# Patient Record
Sex: Female | Born: 1960
Health system: Southern US, Community
[De-identification: ages and names within clinical notes are randomized; demographics above are authoritative.]

## PROBLEM LIST (undated history)

## (undated) DIAGNOSIS — I700 Atherosclerosis of aorta: Secondary | ICD-10-CM

## (undated) DIAGNOSIS — K219 Gastro-esophageal reflux disease without esophagitis: Secondary | ICD-10-CM

## (undated) DIAGNOSIS — J449 Chronic obstructive pulmonary disease, unspecified: Secondary | ICD-10-CM

## (undated) DIAGNOSIS — M810 Age-related osteoporosis without current pathological fracture: Secondary | ICD-10-CM

## (undated) DIAGNOSIS — E559 Vitamin D deficiency, unspecified: Secondary | ICD-10-CM

## (undated) DIAGNOSIS — M549 Dorsalgia, unspecified: Secondary | ICD-10-CM

## (undated) DIAGNOSIS — J45909 Unspecified asthma, uncomplicated: Secondary | ICD-10-CM

## (undated) DIAGNOSIS — R0602 Shortness of breath: Secondary | ICD-10-CM

## (undated) HISTORY — DX: Atherosclerosis of aorta: I70.0

## (undated) HISTORY — DX: Vitamin D deficiency, unspecified: E55.9

## (undated) HISTORY — PX: BREAST EXCISIONAL BIOPSY: SUR124

## (undated) HISTORY — DX: Dorsalgia, unspecified: M54.9

## (undated) HISTORY — PX: BREAST BIOPSY: SHX20

## (undated) HISTORY — DX: Unspecified asthma, uncomplicated: J45.909

## (undated) HISTORY — DX: Age-related osteoporosis without current pathological fracture: M81.0

---

## 1983-09-17 HISTORY — PX: TUBAL LIGATION: SHX77

## 1994-09-16 HISTORY — PX: BREAST ENHANCEMENT SURGERY: SHX7

## 1999-11-14 ENCOUNTER — Other Ambulatory Visit: Admission: RE | Admit: 1999-11-14 | Discharge: 1999-11-14 | Payer: Self-pay | Admitting: Plastic Surgery

## 2006-02-02 ENCOUNTER — Emergency Department (HOSPITAL_COMMUNITY): Admission: EM | Admit: 2006-02-02 | Discharge: 2006-02-02 | Payer: Self-pay | Admitting: Family Medicine

## 2011-07-30 ENCOUNTER — Other Ambulatory Visit: Payer: Self-pay | Admitting: Family Medicine

## 2011-07-30 DIAGNOSIS — R921 Mammographic calcification found on diagnostic imaging of breast: Secondary | ICD-10-CM

## 2011-08-13 ENCOUNTER — Ambulatory Visit
Admission: RE | Admit: 2011-08-13 | Discharge: 2011-08-13 | Disposition: A | Discharge status: Home / Self Care | Payer: No Typology Code available for payment source | Source: Ambulatory Visit | Attending: Family Medicine | Admitting: Family Medicine

## 2011-08-13 DIAGNOSIS — R921 Mammographic calcification found on diagnostic imaging of breast: Secondary | ICD-10-CM

## 2011-08-23 ENCOUNTER — Encounter (HOSPITAL_COMMUNITY): Payer: Self-pay | Admitting: Pharmacy Technician

## 2011-08-26 ENCOUNTER — Encounter (HOSPITAL_COMMUNITY): Payer: Self-pay

## 2011-08-26 ENCOUNTER — Encounter (HOSPITAL_COMMUNITY)
Admission: RE | Admit: 2011-08-26 | Discharge: 2011-08-26 | Disposition: A | Discharge status: Home / Self Care | Payer: PRIVATE HEALTH INSURANCE | Source: Ambulatory Visit | Attending: General Surgery | Admitting: General Surgery

## 2011-08-26 HISTORY — DX: Shortness of breath: R06.02

## 2011-08-26 HISTORY — DX: Gastro-esophageal reflux disease without esophagitis: K21.9

## 2011-08-26 HISTORY — DX: Chronic obstructive pulmonary disease, unspecified: J44.9

## 2011-08-26 LAB — BASIC METABOLIC PANEL
BUN: 15 mg/dL (ref 6–23)
CO2: 28 mEq/L (ref 19–32)
Calcium: 10.6 mg/dL — ABNORMAL HIGH (ref 8.4–10.5)
Chloride: 100 mEq/L (ref 96–112)
Creatinine, Ser: 0.74 mg/dL (ref 0.50–1.10)
GFR calc Af Amer: >90 mL/min (ref >90)
GFR calc non Af Amer: >90 mL/min (ref >90)
Glucose, Bld: 97 mg/dL (ref 70–99)
Potassium: 4.2 mEq/L (ref 3.5–5.1)
Sodium: 138 mEq/L (ref 135–145)

## 2011-08-26 LAB — CBC
HCT: 40.5 % (ref 36.0–46.0)
Hemoglobin: 13.6 g/dL (ref 12.0–15.0)
MCH: 29.8 pg (ref 26.0–34.0)
MCHC: 33.6 g/dL (ref 30.0–36.0)
MCV: 88.6 fL (ref 78.0–100.0)
Platelets: 263 10*3/uL (ref 150–400)
RBC: 4.57 MIL/uL (ref 3.87–5.11)
RDW: 12.9 % (ref 11.5–15.5)
WBC: 6.8 10*3/uL (ref 4.0–10.5)

## 2011-08-26 LAB — DIFFERENTIAL
Basophils Absolute: 0 10*3/uL (ref 0.0–0.1)
Basophils Relative: 0 % (ref 0–1)
Eosinophils Absolute: 0.2 10*3/uL (ref 0.0–0.7)
Eosinophils Relative: 2 % (ref 0–5)
Lymphocytes Relative: 32 % (ref 12–46)
Lymphs Abs: 2.2 10*3/uL (ref 0.7–4.0)
Monocytes Absolute: 0.4 10*3/uL (ref 0.1–1.0)
Monocytes Relative: 6 % (ref 3–12)
Neutro Abs: 4 10*3/uL (ref 1.7–7.7)
Neutrophils Relative %: 59 % (ref 43–77)

## 2011-08-26 LAB — SURGICAL PCR SCREEN
MRSA, PCR: NEGATIVE
Staphylococcus aureus: POSITIVE — AB

## 2011-08-26 NOTE — Patient Instructions (Addendum)
Abigail Gill  08/26/2011   Your procedure is scheduled on:  08/30/2011  Report to [name] at 07:15 AM.  Call this number if you have problems the morning of surgery: [phone]   Remember:   Do not eat food:After Midnight.  May have clear liquids:until Midnight .  Clear liquids include soda, tea, black coffee, apple or grape juice, broth.  Take these medicines the morning of surgery with A SIP OF WATER: Zyrtec, Nexium, Singular. Also, take your inhalers, Albuterol and Advair.   Do not wear jewelry, make-up or nail polish.  Do not wear lotions, powders, or perfumes. You may wear deodorant.  Do not shave 48 hours prior to surgery.  Do not bring valuables to the hospital.  Contacts, dentures or bridgework may not be worn into surgery.  Leave suitcase in the car. After surgery it may be brought to your room.  For patients admitted to the hospital, checkout time is 11:00 AM the day of discharge.   Patients discharged the day of surgery will not be allowed to drive home.  Name and phone number of your driver:   Special Instructions: CHG Shower Use Special Wash: 1/2 bottle night before surgery and 1/2 bottle morning of surgery.   Please read over the following fact sheets that you were given: Pain Booklet, MRSA Information, Surgical Site Infection Prevention, Anesthesia Post-op Instructions and Care and Recovery After Surgery    Lumpectomy, Breast Conserving Surgery A lumpectomy is breast surgery that removes only part of the breast. Another name used may be partial mastectomy. The amount removed varies. Make sure you understand how much of your breast will be removed. Reasons for a lumpectomy:  Any solid breast mass.   Grouped significant nodularity that may be confused with a solitary breast mass.  Lumpectomy is the most common form of breast cancer surgery today. The surgeon removes the portion of your breast which contains the tumor (cancer). This is the lump. Some normal tissue  around the lump is also removed to be sure that all the tumor has been removed.  If cancer cells are found in the margins where the breast tissue was removed, your surgeon will do more surgery to remove the remaining cancer tissue. This is called re-excision surgery. Radiation and/or chemotherapy treatments are often given following a lumpectomy to kill any cancer cells that could possibly remain.  REASONS YOU MAY NOT BE ABLE TO HAVE BREAST CONSERVING SURGERY:  The tumor is located in more than one place.   Your breast is small and the tumor is large so the breast would be disfigured.   The entire tumor removal is not successful with a lumpectomy.   You cannot commit to a full course of chemotherapy, radiation therapy or are pregnant and cannot have radiation.   You have previously had radiation to the breast to treat cancer.  HOW A LUMPECTOMY IS PERFORMED If overnight nursing is not required following a biopsy, a lumpectomy can be performed as a same-day surgery. This can be done in a hospital, clinic, or surgical center. The anesthesia used will depend on your surgeon. They will discuss this with you. A general anesthetic keeps you sleeping through the procedure. LET YOUR CAREGIVERS KNOW ABOUT THE FOLLOWING:  Allergies   Medications taken including herbs, eye drops, over the counter medications, and creams.   Use of steroids (by mouth or creams)   Previous problems with anesthetics or Novocaine.   Possibility of pregnancy, if this applies   History of  blood clots (thrombophlebitis)   History of bleeding or blood problems.   Previous surgery   Other health problems  BEFORE THE PROCEDURE You should be present one hour prior to your procedure unless directed otherwise.  AFTER THE PROCEDURE  After surgery, you will be taken to the recovery area where a nurse will watch and check your progress. Once you're awake, stable, and taking fluids well, barring other problems you will be  allowed to go home.   Ice packs applied to your operative site may help with discomfort and keep the swelling down.   A small rubber drain may be placed in the breast for a couple of days to prevent a hematoma from developing in the breast.   A pressure dressing may be applied for 24 to 48 hours to prevent bleeding.   Keep the wound dry.   You may resume a normal diet and activities as directed. Avoid strenuous activities affecting the arm on the side of the biopsy site such as tennis, swimming, heavy lifting (more than 10 pounds) or pulling.   Bruising in the breast is normal following this procedure.   Wearing a bra - even to bed - may be more comfortable and also help keep the dressing on.   Change dressings as directed.   Only take over-the-counter or prescription medicines for pain, discomfort, or fever as directed by your caregiver.  Call for your results as instructed by your surgeon. Remember it is your responsibility to get the results of your lumpectomy if your surgeon asked you to follow-up. Do not assume everything is fine if you have not heard from your caregiver. SEEK MEDICAL CARE IF:   There is increased bleeding (more than a small spot) from the wound.   You notice redness, swelling, or increasing pain in the wound.   Pus is coming from wound.   An unexplained oral temperature above 102 F (38.9 C) develops.   You notice a foul smell coming from the wound or dressing.  SEEK IMMEDIATE MEDICAL CARE IF:   You develop a rash.   You have difficulty breathing.   You have any allergic problems.  Document Released: 10/14/2006 Document Revised: 05/15/2011 Document Reviewed: 01/15/2007 [facility] Patient Information 2012 [place], [place].   PATIENT INSTRUCTIONS POST-ANESTHESIA  IMMEDIATELY FOLLOWING SURGERY:  Do not drive or operate machinery for the first twenty four hours after surgery.  Do not make any important decisions for twenty four hours after surgery or  while taking narcotic pain medications or sedatives.  If you develop intractable nausea and vomiting or a severe headache please notify your doctor immediately.  FOLLOW-UP:  Please make an appointment with your surgeon as instructed. You do not need to follow up with anesthesia unless specifically instructed to do so.  WOUND CARE INSTRUCTIONS (if applicable):  Keep a dry clean dressing on the anesthesia/puncture wound site if there is drainage.  Once the wound has quit draining you may leave it open to air.  Generally you should leave the bandage intact for twenty four hours unless there is drainage.  If the epidural site drains for more than 36-48 hours please call the anesthesia department.  QUESTIONS?:  Please feel free to call your physician or the hospital operator if you have any questions, and they will be happy to assist you.     [facility] Anesthesia Department [address] [place] [phone]

## 2011-08-29 NOTE — H&P (Signed)
  NTS SOAP Note  Vital Signs:  Vitals as of: 08/22/2011: Systolic 151: Diastolic 89: Heart Rate 90: Temp 96.29F: Height 89ft 2in: Weight 158Lbs 0 Ounces: BMI 29  BMI : 28.9 kg/m2  Subjective: This 69 Years 78 Months old Female presents for of breast changes.  Patient had screening mammography which demonstrated microcalcifications.  Stereotactic bx performed and complex sclerosis lesion with atypia was seen.  Patient has not had any palpaple lesions.  No nipple changes although L nipple became retracted in the 80's.  No nipple discharge.  No fever or chills.  No wt changes.  No family history of breast, ovarian, or uterine CA.   Review of Symptoms:  Constitutional:unremarkable Head:unremarkable Eyes:unremarkableblurred Nose/Mouth/Throat:unremarkable Cardiovascular:unremarkable wheezing Gastrointestinal:unremarkable Genitourinary:unremarkable Musculoskeletal:unremarkable Skin:unremarkable as per HPI Hematolgic/Lymphatic:unremarkable Allergic/Immunologic:unremarkable   Past Medical History:Obtained   Past Medical History  Pregnancy Gravida: 4 Pregnancy Para: 4 Surgical History: tubal, bilater mammoplasty (saline) Medical Problems: COPD, asthma, GERD Psychiatric History: none Allergies: NKDA Medications: singulair, advair, nexium, proair   Social History:Obtained   Social History  Preferred Language: English ([place]) Race:  White Ethnicity: Not Hispanic / Latino Age: 51 Years 10 Months Marital Status:  M Alcohol: no Recreational drug(s): no   Smoking Status: Never smoker reviewed on 08/22/2011  Family History:Obtained   Family History  Is there a family history of:CAD, DM    Objective Information: General:Well appearing, well nourished in no distress. Skin:no rash or prominent lesions Head:Atraumatic; no masses; no abnormalities Eyes:conjunctiva clear, EOM intact, PERRL Mouth:Mucous  membranes moist, no mucosal lesions. Throat:no erythema, exudates or lesions. Neck:Supple without lymphadenopathy.  Heart:RRR, no murmur Lungs:CTA bilaterally, no wheezes, rhonchi, rales.  Breathing unlabored.   Some ecchymosis.  No pain.  some swelling.  NO discharge. Abdomen:Soft, NT/ND, no HSM, no masses. Extremities:No deformities, clubbing, cyanosis, or edema.   Assessment:  Diagnosis &amp; Procedure: DiagnosisCode: 238.3, ProcedureCode: 40981,  Plan: L breast atypia.  Indications for surgery discussed.  L lower, inner quadrant.  Lumpectomy discussed.    Patient Education:Alternative treatments to surgery were discussed with patient (and family).Risks and benefits  of procedure were fully explained to the patient (and family) who gave informed consent. Patient/family questions were addressed.        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

## 2011-08-30 ENCOUNTER — Encounter (HOSPITAL_COMMUNITY): Payer: Self-pay | Admitting: *Deleted

## 2011-08-30 ENCOUNTER — Ambulatory Visit (HOSPITAL_COMMUNITY): Payer: PRIVATE HEALTH INSURANCE

## 2011-08-30 ENCOUNTER — Other Ambulatory Visit: Payer: Self-pay | Admitting: General Surgery

## 2011-08-30 ENCOUNTER — Ambulatory Visit (HOSPITAL_COMMUNITY)
Admission: RE | Admit: 2011-08-30 | Discharge: 2011-08-30 | Disposition: A | Discharge status: Home / Self Care | Payer: PRIVATE HEALTH INSURANCE | Source: Ambulatory Visit | Attending: General Surgery | Admitting: General Surgery

## 2011-08-30 ENCOUNTER — Encounter (HOSPITAL_COMMUNITY)
Admission: RE | Disposition: A | Discharge status: Home / Self Care | Payer: Self-pay | Source: Ambulatory Visit | Attending: General Surgery

## 2011-08-30 ENCOUNTER — Encounter (HOSPITAL_COMMUNITY): Payer: Self-pay | Admitting: Anesthesiology

## 2011-08-30 DIAGNOSIS — N649 Disorder of breast, unspecified: Secondary | ICD-10-CM | POA: Insufficient documentation

## 2011-08-30 DIAGNOSIS — Z01812 Encounter for preprocedural laboratory examination: Secondary | ICD-10-CM | POA: Insufficient documentation

## 2011-08-30 DIAGNOSIS — J449 Chronic obstructive pulmonary disease, unspecified: Secondary | ICD-10-CM | POA: Insufficient documentation

## 2011-08-30 DIAGNOSIS — N6019 Diffuse cystic mastopathy of unspecified breast: Secondary | ICD-10-CM

## 2011-08-30 DIAGNOSIS — J4489 Other specified chronic obstructive pulmonary disease: Secondary | ICD-10-CM | POA: Insufficient documentation

## 2011-08-30 HISTORY — PX: BREAST LUMPECTOMY: SHX2

## 2011-08-30 SURGERY — BREAST LUMPECTOMY
Anesthesia: General | Site: Breast | Laterality: Left | Wound class: Clean

## 2011-08-30 MED ORDER — MIDAZOLAM HCL 2 MG/2ML IJ SOLN
INTRAMUSCULAR | Status: AC
  Filled 2011-08-30: qty 2

## 2011-08-30 MED ORDER — FENTANYL CITRATE 0.05 MG/ML IJ SOLN
INTRAMUSCULAR | Status: AC
  Filled 2011-08-30: qty 2

## 2011-08-30 MED ORDER — ACETAMINOPHEN 325 MG PO TABS: 325.0000 mg | ORAL_TABLET | ORAL | Status: DC | PRN

## 2011-08-30 MED ORDER — BUPIVACAINE HCL (PF) 0.5 % IJ SOLN
INTRAMUSCULAR | Status: DC | PRN
  Administered 2011-08-30: 10 mL

## 2011-08-30 MED ORDER — CELECOXIB 100 MG PO CAPS
ORAL_CAPSULE | ORAL | Status: AC
  Administered 2011-08-30: 400 mg via ORAL
  Filled 2011-08-30: qty 4

## 2011-08-30 MED ORDER — PROPOFOL 10 MG/ML IV EMUL
INTRAVENOUS | Status: AC
  Filled 2011-08-30: qty 20

## 2011-08-30 MED ORDER — CEFAZOLIN SODIUM 1-5 GM-% IV SOLN: 1.0000 g | INTRAVENOUS | Status: DC

## 2011-08-30 MED ORDER — CEFAZOLIN SODIUM 1-5 GM-% IV SOLN
INTRAVENOUS | Status: DC | PRN
  Administered 2011-08-30: 1 g via INTRAVENOUS

## 2011-08-30 MED ORDER — CELECOXIB 100 MG PO CAPS
400.0000 mg | ORAL_CAPSULE | Freq: Every day | ORAL | Status: AC
  Administered 2011-08-30: 400 mg via ORAL

## 2011-08-30 MED ORDER — GLYCOPYRROLATE 0.2 MG/ML IJ SOLN
INTRAMUSCULAR | Status: AC
  Administered 2011-08-30: 0.2 mg via INTRAVENOUS
  Filled 2011-08-30: qty 1

## 2011-08-30 MED ORDER — LACTATED RINGERS IV SOLN
INTRAVENOUS | Status: DC
  Administered 2011-08-30: 500 mL via INTRAVENOUS
  Administered 2011-08-30: 1000 mL via INTRAVENOUS

## 2011-08-30 MED ORDER — ONDANSETRON HCL 4 MG/2ML IJ SOLN
4.0000 mg | Freq: Once | INTRAMUSCULAR | Status: AC
  Administered 2011-08-30: 4 mg via INTRAVENOUS

## 2011-08-30 MED ORDER — ALBUTEROL SULFATE (5 MG/ML) 0.5% IN NEBU
2.5000 mg | INHALATION_SOLUTION | Freq: Four times a day (QID) | RESPIRATORY_TRACT | Status: DC | PRN
  Administered 2011-08-30: 2.5 mg via RESPIRATORY_TRACT
  Filled 2011-08-30: qty 0.5

## 2011-08-30 MED ORDER — MIDAZOLAM HCL 2 MG/2ML IJ SOLN
1.0000 mg | INTRAMUSCULAR | Status: DC | PRN
  Administered 2011-08-30: 2 mg via INTRAVENOUS

## 2011-08-30 MED ORDER — MIDAZOLAM HCL 5 MG/5ML IJ SOLN
INTRAMUSCULAR | Status: DC | PRN
  Administered 2011-08-30: 2 mg via INTRAVENOUS

## 2011-08-30 MED ORDER — MIDAZOLAM HCL 2 MG/2ML IJ SOLN
INTRAMUSCULAR | Status: AC
  Administered 2011-08-30: 2 mg via INTRAVENOUS
  Filled 2011-08-30: qty 2

## 2011-08-30 MED ORDER — ONDANSETRON HCL 4 MG/2ML IJ SOLN
INTRAMUSCULAR | Status: AC
  Administered 2011-08-30: 4 mg via INTRAVENOUS
  Filled 2011-08-30: qty 2

## 2011-08-30 MED ORDER — ALBUTEROL SULFATE (5 MG/ML) 0.5% IN NEBU
INHALATION_SOLUTION | RESPIRATORY_TRACT | Status: AC
  Administered 2011-08-30: 2.5 mg via RESPIRATORY_TRACT
  Filled 2011-08-30: qty 0.5

## 2011-08-30 MED ORDER — PROPOFOL 10 MG/ML IV BOLUS
INTRAVENOUS | Status: DC | PRN
  Administered 2011-08-30: 130 mg via INTRAVENOUS

## 2011-08-30 MED ORDER — FENTANYL CITRATE 0.05 MG/ML IJ SOLN
25.0000 ug | INTRAMUSCULAR | Status: DC | PRN
  Administered 2011-08-30: 25 ug via INTRAVENOUS

## 2011-08-30 MED ORDER — ENOXAPARIN SODIUM 40 MG/0.4ML ~~LOC~~ SOLN
40.0000 mg | Freq: Once | SUBCUTANEOUS | Status: AC
  Administered 2011-08-30: 40 mg via SUBCUTANEOUS

## 2011-08-30 MED ORDER — ENOXAPARIN SODIUM 40 MG/0.4ML ~~LOC~~ SOLN
SUBCUTANEOUS | Status: AC
  Administered 2011-08-30: 40 mg via SUBCUTANEOUS
  Filled 2011-08-30: qty 0.4

## 2011-08-30 MED ORDER — LIDOCAINE HCL (PF) 1 % IJ SOLN
INTRAMUSCULAR | Status: AC
  Filled 2011-08-30: qty 5

## 2011-08-30 MED ORDER — GLYCOPYRROLATE 0.2 MG/ML IJ SOLN
0.2000 mg | Freq: Once | INTRAMUSCULAR | Status: AC | PRN
  Administered 2011-08-30: 0.2 mg via INTRAVENOUS

## 2011-08-30 MED ORDER — BUPIVACAINE HCL (PF) 0.5 % IJ SOLN
INTRAMUSCULAR | Status: AC
  Filled 2011-08-30: qty 30

## 2011-08-30 MED ORDER — LIDOCAINE HCL 1 % IJ SOLN
INTRAMUSCULAR | Status: DC | PRN
  Administered 2011-08-30: 25 mg via INTRADERMAL

## 2011-08-30 MED ORDER — ONDANSETRON HCL 4 MG/2ML IJ SOLN: 4.0000 mg | Freq: Once | INTRAMUSCULAR | Status: DC | PRN

## 2011-08-30 MED ORDER — HYDROCODONE-ACETAMINOPHEN 5-325 MG PO TABS: 1.0000 | ORAL_TABLET | ORAL | Status: AC | PRN

## 2011-08-30 MED ORDER — SODIUM CHLORIDE 0.9 % IN NEBU
INHALATION_SOLUTION | RESPIRATORY_TRACT | Status: AC
  Filled 2011-08-30: qty 3

## 2011-08-30 MED ORDER — FENTANYL CITRATE 0.05 MG/ML IJ SOLN
INTRAMUSCULAR | Status: DC | PRN
  Administered 2011-08-30 (×2): 50 ug via INTRAVENOUS

## 2011-08-30 SURGICAL SUPPLY — 31 items
BAG HAMPER (MISCELLANEOUS) ×2 IMPLANT
BNDG CONFORM 6X.82 1P STRL (GAUZE/BANDAGES/DRESSINGS) ×2 IMPLANT
CLOTH BEACON ORANGE TIMEOUT ST (SAFETY) ×2 IMPLANT
COVER LIGHT HANDLE STERIS (MISCELLANEOUS) ×4 IMPLANT
DECANTER SPIKE VIAL GLASS SM (MISCELLANEOUS) ×2 IMPLANT
DURAPREP 26ML APPLICATOR (WOUND CARE) ×2 IMPLANT
ELECT REM PT RETURN 9FT ADLT (ELECTROSURGICAL) ×2
ELECTRODE REM PT RTRN 9FT ADLT (ELECTROSURGICAL) ×1 IMPLANT
GLOVE BIOGEL PI IND STRL 7.5 (GLOVE) ×1 IMPLANT
GLOVE BIOGEL PI INDICATOR 7.5 (GLOVE) ×1
GLOVE ECLIPSE 7.0 STRL STRAW (GLOVE) ×2 IMPLANT
GOWN STRL REIN XL XLG (GOWN DISPOSABLE) ×6 IMPLANT
KIT ROOM TURNOVER APOR (KITS) ×2 IMPLANT
MANIFOLD NEPTUNE II (INSTRUMENTS) ×2 IMPLANT
NEEDLE HYPO 25X1 1.5 SAFETY (NEEDLE) ×2 IMPLANT
NS IRRIG 1000ML POUR BTL (IV SOLUTION) ×2 IMPLANT
PACK MINOR (CUSTOM PROCEDURE TRAY) ×2 IMPLANT
PAD ARMBOARD 7.5X6 YLW CONV (MISCELLANEOUS) ×2 IMPLANT
SET BASIN LINEN APH (SET/KITS/TRAYS/PACK) ×2 IMPLANT
SPONGE GAUZE 2X2 8PLY STRL LF (GAUZE/BANDAGES/DRESSINGS) IMPLANT
STOCKINETTE IMPERVIOUS LG (DRAPES) ×2 IMPLANT
STRIP CLOSURE SKIN 1/2X4 (GAUZE/BANDAGES/DRESSINGS) ×2 IMPLANT
SUT MNCRL AB 4-0 PS2 18 (SUTURE) ×2 IMPLANT
SUT SILK 3 0 (SUTURE) ×1
SUT SILK 3-0 FS1 18XBRD (SUTURE) ×1 IMPLANT
SUT VIC AB 3-0 SH 27 (SUTURE) ×1
SUT VIC AB 3-0 SH 27X BRD (SUTURE) ×1 IMPLANT
SWABSTICK BENZOIN STERILE (MISCELLANEOUS) ×2 IMPLANT
SYR BULB IRRIGATION 50ML (SYRINGE) ×2 IMPLANT
SYR CONTROL 10ML LL (SYRINGE) ×2 IMPLANT
TOWEL OR 17X26 4PK STRL BLUE (TOWEL DISPOSABLE) ×2 IMPLANT

## 2011-08-30 NOTE — Interval H&P Note (Signed)
History and Physical Interval Note:  08/30/2011 8:51 AM  Abigail Gill  has presented today for surgery, with the diagnosis of Breast mass [611.72]  The various methods of treatment have been discussed with the patient and family. After consideration of risks, benefits and other options for treatment, the patient has consented to  Procedure(s): LUMPECTOMY as a surgical intervention .  The patients' history has been reviewed, patient examined, no change in status, stable for surgery.  I have reviewed the patients' chart and labs.  Questions were answered to the patient's satisfaction.     Larhonda Dettloff C

## 2011-08-30 NOTE — Anesthesia Postprocedure Evaluation (Signed)
  Anesthesia Post-op Note  Patient: Abigail Gill  Procedure(s) Performed:  LUMPECTOMY  Patient Location: PACU  Anesthesia Type: General  Level of Consciousness: awake, alert , oriented and patient cooperative  Airway and Oxygen Therapy: Patient Spontanous Breathing  Post-op Pain: none  Post-op Assessment: Post-op Vital signs reviewed, PATIENT'S CARDIOVASCULAR STATUS UNSTABLE, Respiratory Function Stable, Patent Airway and No signs of Nausea or vomiting  Post-op Vital Signs: Reviewed and stable  Complications: No apparent anesthesia complications

## 2011-08-30 NOTE — Transfer of Care (Signed)
Immediate Anesthesia Transfer of Care Note  Patient: Abigail Gill  Procedure(s) Performed:  LUMPECTOMY  Patient Location: PACU  Anesthesia Type: General  Level of Consciousness: awake and patient cooperative  Airway & Oxygen Therapy: Patient Spontanous Breathing and Patient connected to face mask oxygen  Post-op Assessment: Report given to PACU RN, Post -op Vital signs reviewed and stable and Patient moving all extremities  Post vital signs: Reviewed and stable  Complications: No apparent anesthesia complications

## 2011-08-30 NOTE — Anesthesia Procedure Notes (Signed)
Procedure Name: LMA Insertion Date/Time: 08/30/2011 9:21 AM Performed by: Despina Hidden Pre-anesthesia Checklist: Patient identified, Patient being monitored, Emergency Drugs available and Suction available Patient Re-evaluated:Patient Re-evaluated prior to inductionOxygen Delivery Method: Circle System Utilized Preoxygenation: Pre-oxygenation with 100% oxygen Intubation Type: IV induction Ventilation: Mask ventilation without difficulty LMA Size: 3.0 Grade View: Grade I Number of attempts: 1 Placement Confirmation: positive ETCO2 and breath sounds checked- equal and bilateral Tube secured with: Tape Dental Injury: Teeth and Oropharynx as per pre-operative assessment

## 2011-08-30 NOTE — Progress Notes (Signed)
When pt arrived to short stay recovery on room air, pt's O2 sat was 88%. Pt was placed on O2 at 2L via [place] again (as pt was in PACU) and O2 sat increased to 99%. Pt is not O2 dependent at home, but does have COPD and report SOB with exertion. Pt was instructed to take deep breaths and cough deeply and O2 sat has been increasing with these activities. Pt was taken walking around post op for several minutes and pt denied any SOB, pt's cap refill was < 2 seconds and lip color was good while walking. On return to pt's short stay recovery room her O2 sat was 84% on RA and HR 133. Pt sat down to rest, but still with no complaints of SOB. Within a minute pt's HR decreased back to normal, 73 bpm and O2 sat at 95% on RA. Pt has been able to maintain this O2 saturation for greater than 30 minutes on RA. Dr. Tollie Eth was notified of pt's condition to see if he was okay with pt going home considering her increased O2 needs in PACU and slightly increased need in short stay recovery. Dr. Tollie Eth said to have her f/u with her PCP early next week and notify them of situation to evaluate her to see if she might need further testing to evaluate her O2 needs. Pt was instructed to call 911 or return to ER if she started experiencing unrelieved SOB with rest, dizziness, etc. Pt and family verbalized understanding and pt was discharged home in stable condition.

## 2011-08-30 NOTE — Addendum Note (Signed)
Addendum  created 08/30/11 1042 by Roselie Awkward, MD   Modules edited:Anesthesia Attestations

## 2011-08-30 NOTE — Addendum Note (Signed)
Addendum  created 08/30/11 1043 by Roselie Awkward, MD   Modules edited:Anesthesia Attestations

## 2011-08-30 NOTE — Anesthesia Preprocedure Evaluation (Addendum)
Anesthesia Evaluation  Patient identified by MRN, date of birth, ID band Patient awake    Reviewed: Allergy & Precautions, H&P , NPO status , Patient's Chart, lab work & pertinent test results  Airway Mallampati: I TM Distance: >3 FB Neck ROM: Full    Dental  (+) Upper Dentures and Edentulous Lower   Pulmonary shortness of breath and with exertion, COPDformer smoker   Pulmonary exam normal       Cardiovascular neg cardio ROS Regular Normal    Neuro/Psych    GI/Hepatic Neg liver ROS, GERD-  Medicated and Controlled,  Endo/Other  Negative Endocrine ROS  Renal/GU negative Renal ROS     Musculoskeletal negative musculoskeletal ROS (+)   Abdominal Normal abdominal exam  (+)   Peds  Hematology negative hematology ROS (+)   Anesthesia Other Findings   Reproductive/Obstetrics                           Anesthesia Physical Anesthesia Plan  ASA: II  Anesthesia Plan: General   Post-op Pain Management:    Induction: Intravenous  Airway Management Planned: LMA  Additional Equipment:   Intra-op Plan:   Post-operative Plan: Extubation in OR  Informed Consent: I have reviewed the patients History and Physical, chart, labs and discussed the procedure including the risks, benefits and alternatives for the proposed anesthesia with the patient or authorized representative who has indicated his/her understanding and acceptance.     Plan Discussed with: CRNA  Anesthesia Plan Comments:        Anesthesia Quick Evaluation

## 2011-08-30 NOTE — Op Note (Signed)
Patient:  Abigail Gill  DOB:  September 11, 1961  MRN:  [id number]   Preop Diagnosis:  Left breast atypia  Postop Diagnosis:  The same  Procedure:  Lumpectomy left breast  Surgeon:  Dr. Tilford Pillar  Anes:  General endotracheal, half percent Sensorcaine plain  Indications:  Patient is a 50 year old female presented to my office with a history of changes on her mammogram. She did undergo a stereotactic biopsy which demonstrated concerning atypia of the left breast. Formal surgical excision was requested and patient presented to my office. Risks benefits alternatives of a lumpectomy were discussed at length patient including but not limited to risk of bleeding, infection, recurrence as well as the possibility of additional surgeries. Her questions and concerns were addressed and the patient was consented for the planned procedure.  Procedure note:  Patient was taken to the OR was placed in the supine position on the OR table. This point the general anesthetic is administered and the patient was endotracheally intubated by the nurse anesthetist was patient was asleep. At this point her left breast prepped with DuraPrep solution and draped in standard fashion. An infra-areolar incision was created with a 15 blade scalpel. Additional dissection down to the subcuticular tissues carried out with careful electrocautery dissection. The biopsy track is identified and followed down to the thickened tissue of the biopsy cavity. This area is excised circumferentially with a healthy rim of adipose and breast tissue around it. Upon completely freeing this area it was marked with a short suture superiorly, long suture laterally, and a single suture in the superficial orientation. The specimen was then sent to radiology to confirm that the biopsy marker was included in the specimen. At this point did irrigate the field and the incompetent that this was the specimen I did initially close the deep subcuticular tissue with  a 3-0 Vicryl in simple or fashion. A moistened Ray-Tec sponge was then placed over the incision while we waited for the report from radiology. After confirmation by the radiologist that the clip was present I turned my attention to completion of the closure. The local anesthetic was instilled. A 4-0 Monocryl was utilized to reapproximate the skin edges in a running subcuticular suture. The skin was washed and dried a moistened dry towel. Benzoin is applied around incision. Half-inch Steri-Strips are placed. The drapes removed. The patient was allowed to come out of general anesthetic. She is transferred to PACU in stable condition. At the conclusion of procedure all instrument, sponge, needle counts are correct. The patient tolerated procedure extremely well.  Complications:  None  EBL:  Scant  Specimen:  Breast tissue

## 2011-09-04 ENCOUNTER — Encounter (HOSPITAL_COMMUNITY): Payer: Self-pay | Admitting: General Surgery

## 2011-09-04 NOTE — Addendum Note (Signed)
Addendum  created 09/04/11 1317 by Despina Hidden   Modules edited:Charting, Inpatient Notes

## 2011-09-04 NOTE — Addendum Note (Signed)
Addendum  created 09/04/11 1316 by Despina Hidden   Modules edited:Charting, Inpatient Notes

## 2012-11-24 LAB — PULMONARY FUNCTION TEST

## 2013-02-09 IMAGING — MG MM BREAST STEREO BIOPSY*L*
4 series · 4 of 4 positions shown · non-contrast
Comparison: none

***ADDENDUM*** CREATED: 08/15/2011 [DATE]

The pathology reveals complex sclerosing lesion with atypia.  This
is found to be concordant with imaging findings.  I discussed the
results with the patient over the phone.  The patient states she is
doing well post biopsy without complications.  Recommend surgical
excision.  The patient has appointment to see Dr. Maleski on
August 20, 2011.
***END ADDENDUM*** SIGNED BY: Seng Maeda, M.D.
CLINICAL DATA: Left breast microcalcifications or biopsy

[L CC (1 of 2)]
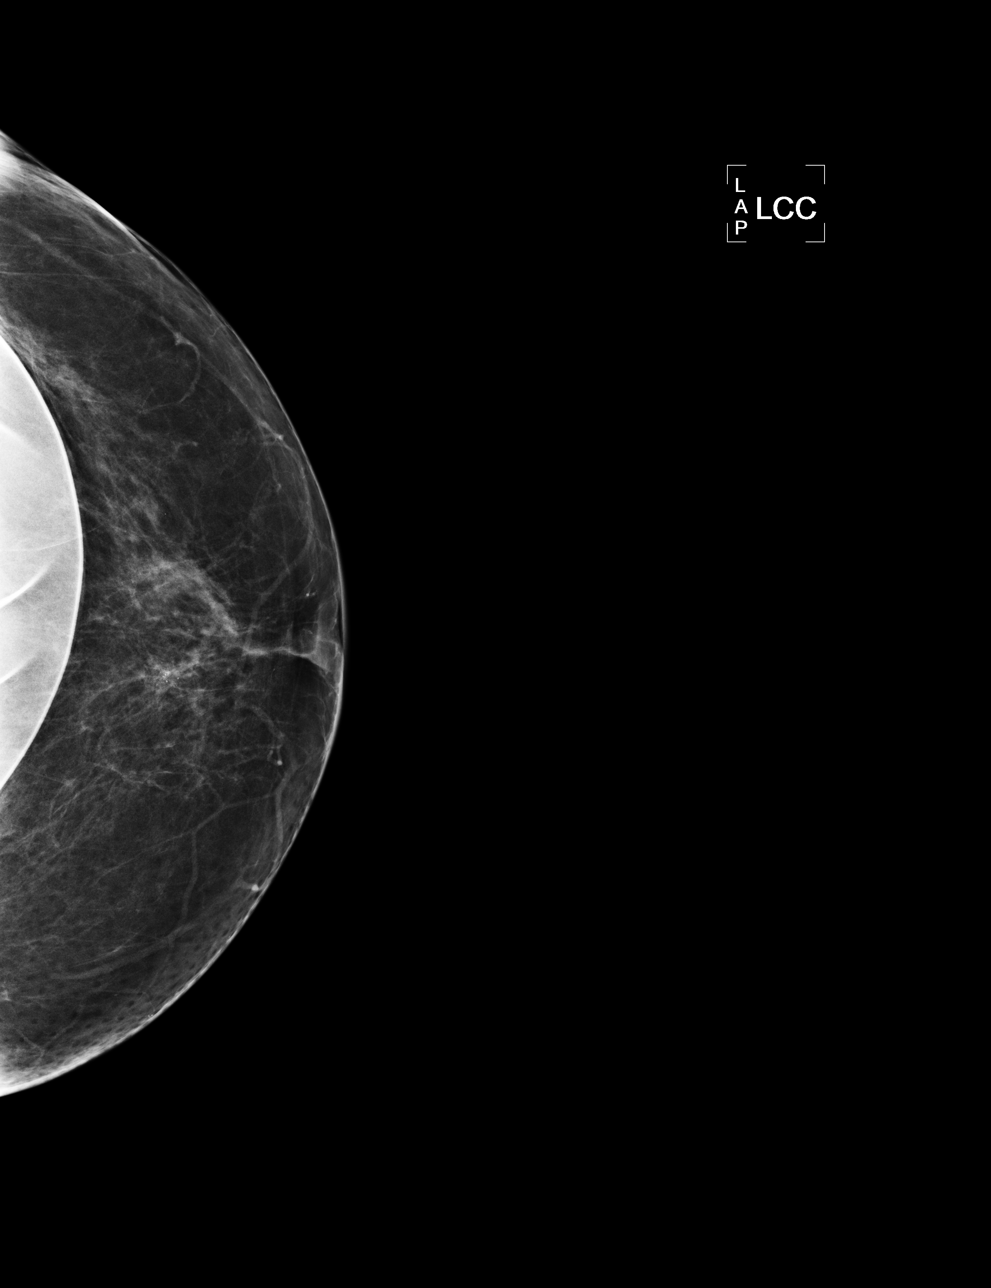

[L CC (2 of 2)]
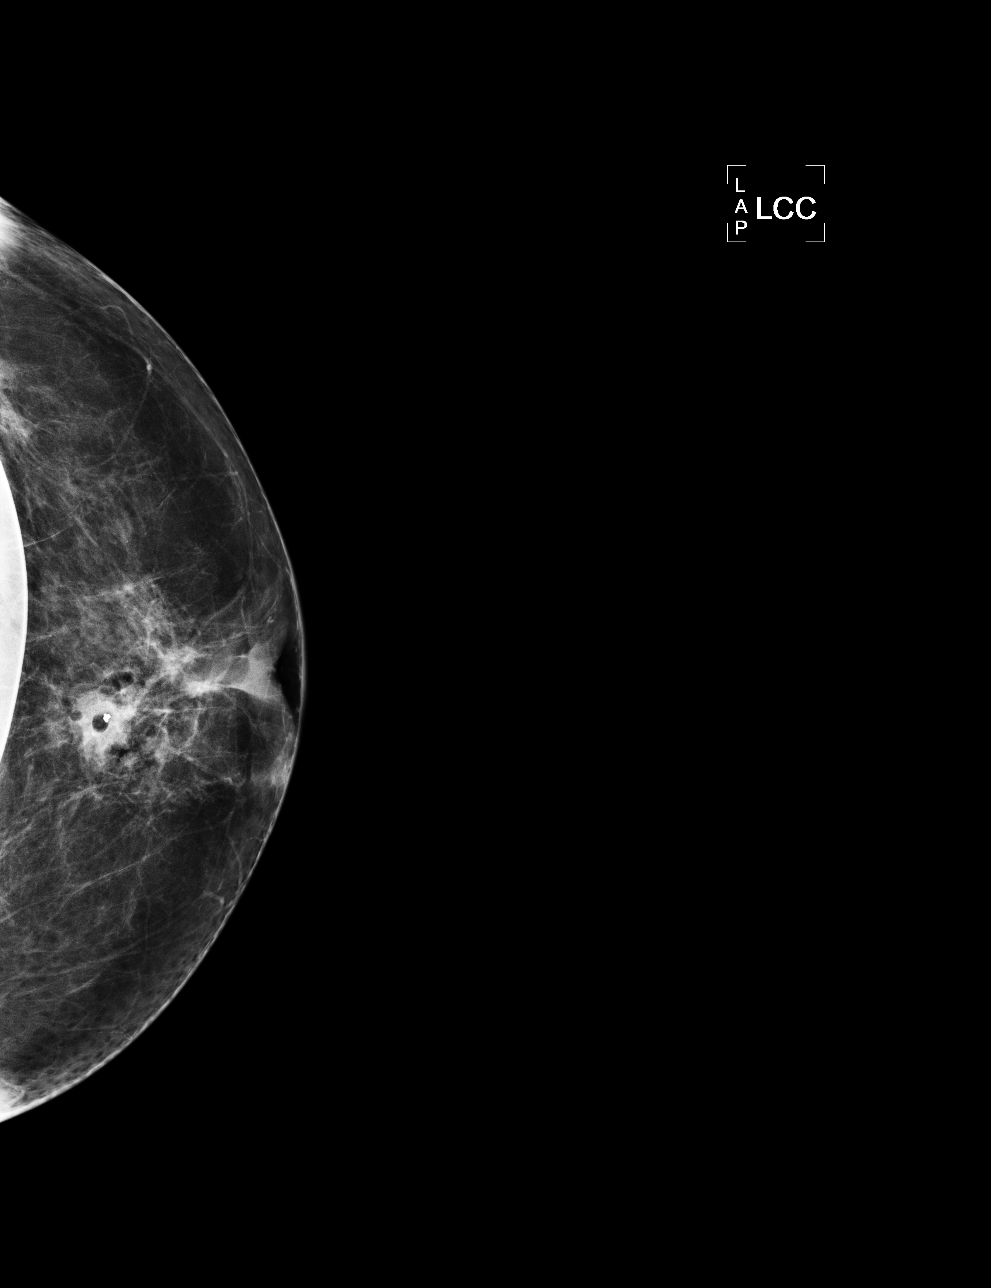

[L ML (1 of 2)]
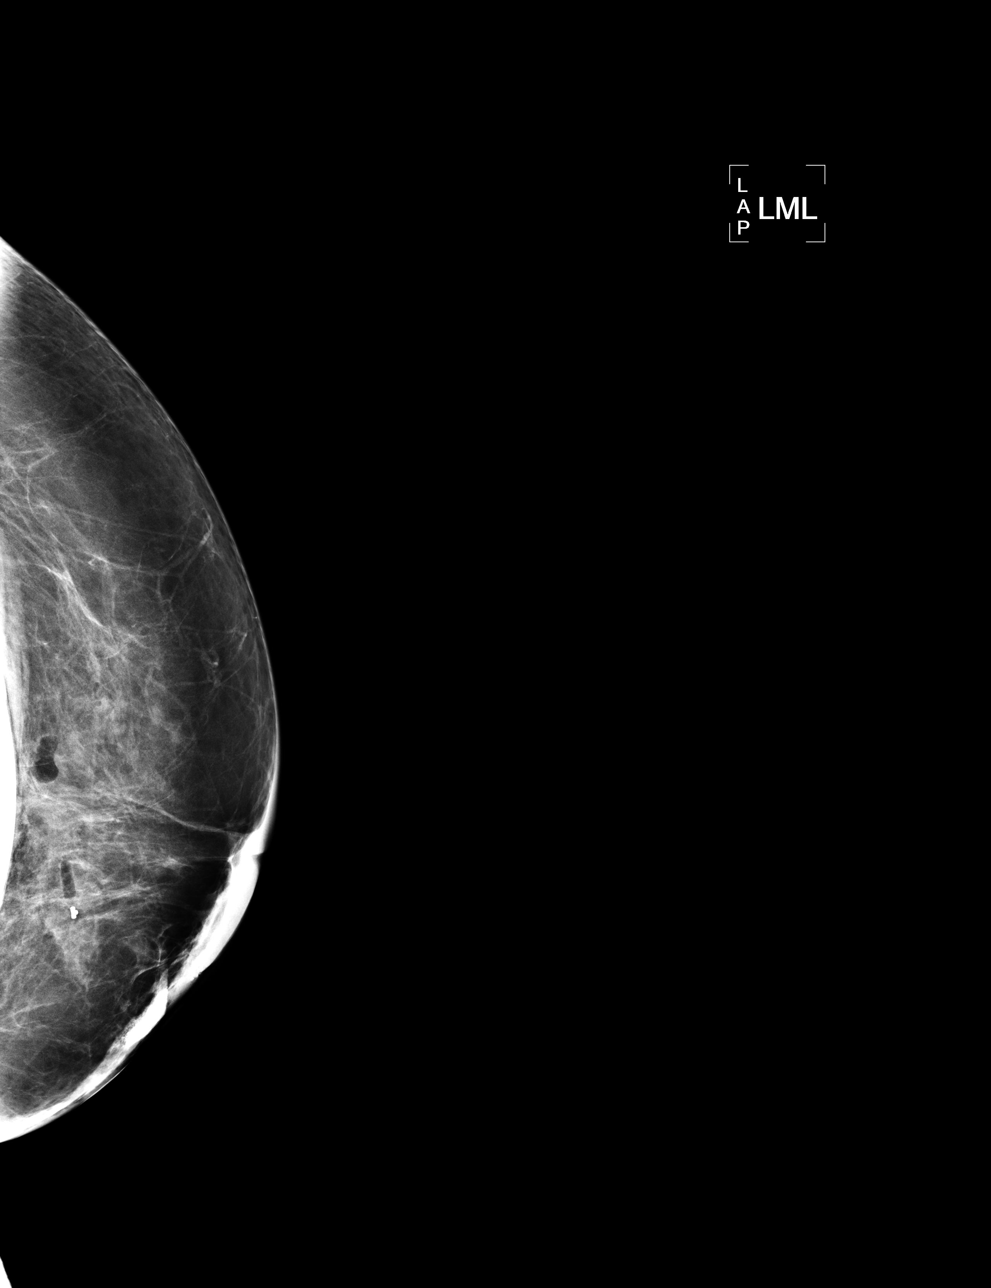

[L ML (2 of 2)]
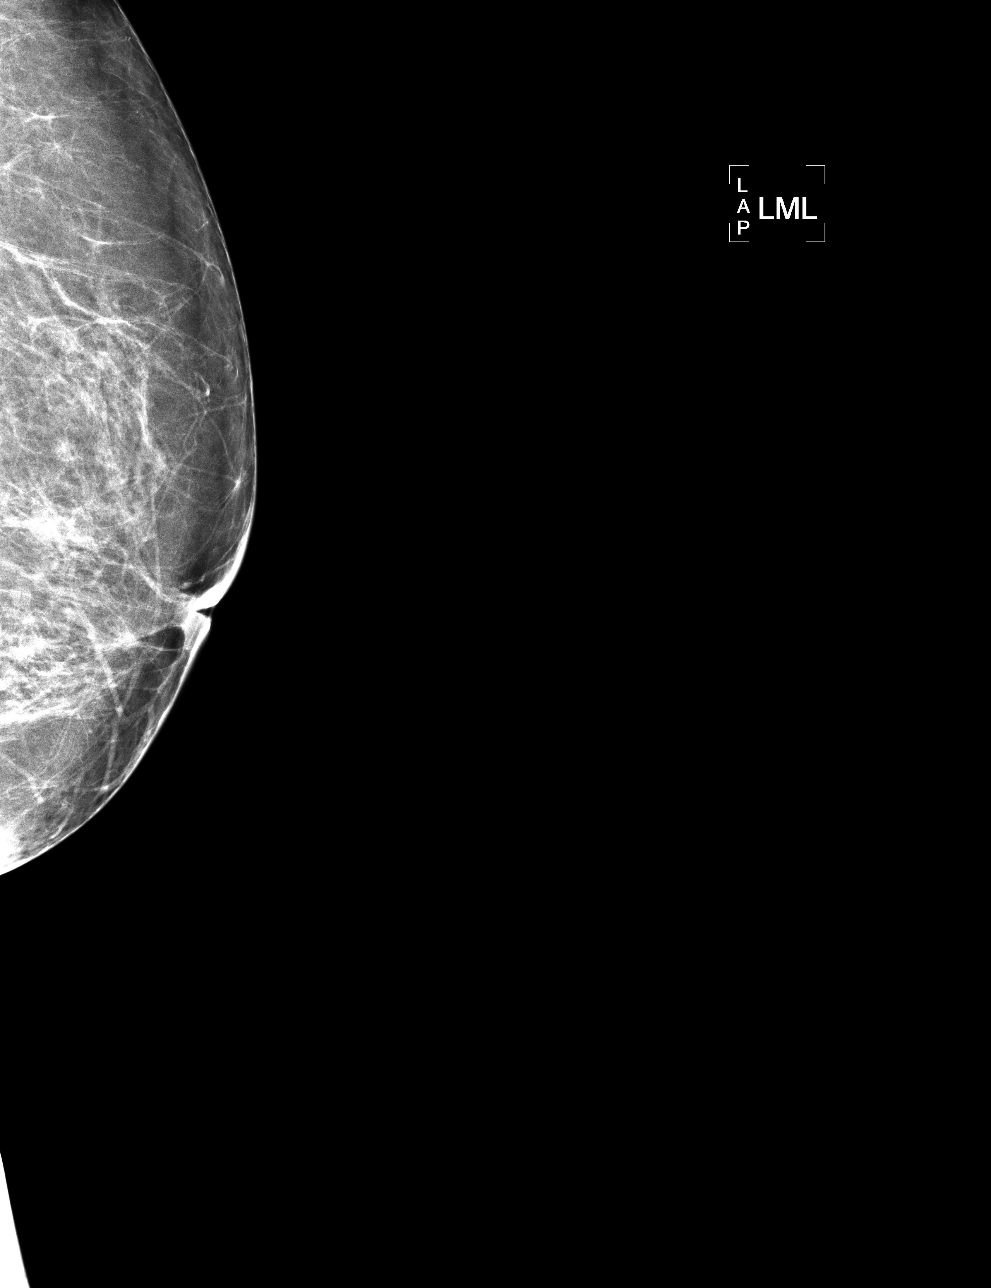

[4 of 4 positions shown; findings below may reference images not displayed]

STEREOTACTIC-GUIDED VACUUM ASSISTED BIOPSY OF THE LEFT BREAST AND
SPECIMEN RADIOGRAPH

I met with the patient, and we discussed the procedure of
ultrasound-guided biopsy, including benefits and alternatives.  We
discussed the high likelihood of a successful
procedure. We discussed the risks of the procedure, including
infection, bleeding, tissue injury, clip migration, and inadequate
sampling.  Informed, written consent was given.
Using sterile technique, 2% lidocaine, stereotactic guidance, and a
9 gauge vacuum assisted device, biopsy was performed of
indeterminate microcalcifications left breast.  Specimen radiograph
was performed, showing inclusion of calcifications of concern.
Specimens with calcifications are identified for pathology.

At the conclusion of the procedure, a tissue marker clip was
deployed into the biopsy cavity.  Follow-up 2-view mammogram
confirmed clip in the area of concern.
IMPRESSION: Stereotactic-guided biopsy of left breast.  No apparent
complications.

## 2015-04-19 ENCOUNTER — Ambulatory Visit (INDEPENDENT_AMBULATORY_CARE_PROVIDER_SITE_OTHER): Payer: Medicare Other | Admitting: Cardiology

## 2015-04-19 ENCOUNTER — Other Ambulatory Visit: Payer: Self-pay | Admitting: Cardiology

## 2015-04-19 ENCOUNTER — Encounter: Payer: Self-pay | Admitting: Cardiology

## 2015-04-19 ENCOUNTER — Ambulatory Visit (HOSPITAL_COMMUNITY)
Admission: RE | Admit: 2015-04-19 | Discharge: 2015-04-19 | Disposition: A | Discharge status: Home / Self Care | Payer: Medicare Other | Source: Ambulatory Visit | Attending: Cardiology | Admitting: Cardiology

## 2015-04-19 VITALS — BP 130/80 | HR 87 | Ht 62.0 in | Wt 160.0 lb

## 2015-04-19 DIAGNOSIS — M7989 Other specified soft tissue disorders: Secondary | ICD-10-CM

## 2015-04-19 DIAGNOSIS — R002 Palpitations: Secondary | ICD-10-CM

## 2015-04-19 DIAGNOSIS — R0600 Dyspnea, unspecified: Secondary | ICD-10-CM | POA: Diagnosis not present

## 2015-04-19 DIAGNOSIS — Z136 Encounter for screening for cardiovascular disorders: Secondary | ICD-10-CM

## 2015-04-19 NOTE — Patient Instructions (Signed)
Your physician recommends that you schedule a follow-up appointment in: 3 weeks    Go to x-ray dept now and they will work you in for Ultrasound of your legs.please be aware there may be a wait.     Your physician has recommended that you wear an event monitor for 14 days. Event monitors are medical devices that record the heart's electrical activity. Doctors most often [place] these monitors to diagnose arrhythmias. Arrhythmias are problems with the speed or rhythm of the heartbeat. The monitor is a small, portable device. You can wear one while you do your normal daily activities. This is usually used to diagnose what is causing palpitations/syncope (passing out).The company will mail it to your home and you return postage already paid by [employer]     Thank you for choosing [facility] !

## 2015-04-19 NOTE — Progress Notes (Signed)
Patient ID: ALLISSON SCHINDEL, female   DOB: April 10, 1961, 54 y.o.   MRN: [id number]     Clinical Summary Ms. Kiely is a 54 y.o.female seen today as a new patient for the following medical problems.  1. Palpitations - on and off for 6 months. Mainly occurs at night, but at times can occur during the day. Feels hot and sweaty during episodes. No chest pain, no SOB. Lasts just a few minutes. Occurs 1-2 times a week. - coffee x 1 cup in morning, no tea, pepsi 8 Oz daily, no energy drinks, no EtOH.   2. Leg swelling - recent swelling both sides. Started over the weekend. Tight cramping feeling bilaterally. No long car trips, no plane flights. No history of blood clots. Mother with history of blood clots.  - DOE at 100 ft, stable for few years.    Past Medical History  Diagnosis Date  . COPD (chronic obstructive pulmonary disease)   . Shortness of breath     with exertion  . GERD (gastroesophageal reflux disease)      No Known Allergies   Current Outpatient Prescriptions  Medication Sig Dispense Refill  . albuterol (PROVENTIL HFA;VENTOLIN HFA) 108 (90 BASE) MCG/ACT inhaler Inhale 2 puffs into the lungs every 4 (four) hours as needed. For shortness of breath     . cetirizine (ZYRTEC) 10 MG tablet Take 10 mg by mouth at bedtime.      Marland Kitchen esomeprazole (NEXIUM) 40 MG capsule Take 40 mg by mouth daily before breakfast.      . Fluticasone-Salmeterol (ADVAIR) 250-50 MCG/DOSE AEPB Inhale 1 puff into the lungs every 12 (twelve) hours.      . montelukast (SINGULAIR) 10 MG tablet Take 10 mg by mouth every morning.       No current facility-administered medications for this visit.     Past Surgical History  Procedure Laterality Date  . Breast enhancement surgery  1996    bilateral-[place]  . Tubal ligation  1985    [facility]  . Breast lumpectomy  08/30/2011    Procedure: LUMPECTOMY;  Surgeon: Fabio Bering;  Location: [facility];  Service: General;  Laterality: Left;     No Known  Allergies    Family History  Problem Relation Age of Onset  . Anesthesia problems Neg Hx   . Hypotension Neg Hx   . Malignant hyperthermia Neg Hx   . Pseudochol deficiency Neg Hx      Social History Ms. Xu reports that she has quit smoking. Her smoking use included Cigarettes. She has a 25 pack-year smoking history. She does not have any smokeless tobacco history on file. Ms. Arakelian reports that she does not drink alcohol.   Review of Systems CONSTITUTIONAL: No weight loss, fever, chills, weakness or fatigue.  HEENT: Eyes: No visual loss, blurred vision, double vision or yellow sclerae.No hearing loss, sneezing, congestion, runny nose or sore throat.  SKIN: No rash or itching.  CARDIOVASCULAR: per HPI RESPIRATORY: No shortness of breath, cough or sputum.  GASTROINTESTINAL: No anorexia, nausea, vomiting or diarrhea. No abdominal pain or blood.  GENITOURINARY: No burning on urination, no polyuria NEUROLOGICAL: No headache, dizziness, syncope, paralysis, ataxia, numbness or tingling in the extremities. No change in bowel or bladder control.  MUSCULOSKELETAL: leg pain  LYMPHATICS: No enlarged nodes. No history of splenectomy.  PSYCHIATRIC: No history of depression or anxiety.  ENDOCRINOLOGIC: No reports of sweating, cold or heat intolerance. No polyuria or polydipsia.  Marland Kitchen   Physical Examination Filed  Vitals:   04/19/15 1455  BP: 130/80  Pulse: 87   Filed Vitals:   04/19/15 1455  Height: 5\' 2"  (1.575 m)  Weight: 160 lb (72.576 kg)    Gen: resting comfortably, no acute distress HEENT: no scleral icterus, pupils equal round and reactive, no palptable cervical adenopathy,  CV: RRR, no m/r/g, no JVD Resp: Clear to auscultation bilaterally GI: abdomen is soft, non-tender, non-distended, normal bowel sounds, no hepatosplenomegaly MSK: extremities are warm, no edema.  Skin: warm, no rash Neuro:  no focal deficits Psych: appropriate affect     Assessment and Plan  1.  Palpitations - will obtain 14 day monitor to further evaluate - request labs from pcp, if no K,Mg, and TSH will need to order at next visit  2. Leg swelling - bilateral leg swelling and pain starting over the weekend. Her mother has history of DVT. Will initially obtain LE venous Dopplers, if negative obtain echo in the setting of DOE and leg swelling.   F/u 3 weeks.     Antoine Poche, M.D.

## 2015-04-20 ENCOUNTER — Telehealth: Payer: Self-pay | Admitting: *Deleted

## 2015-04-20 DIAGNOSIS — R0602 Shortness of breath: Secondary | ICD-10-CM

## 2015-04-20 NOTE — Telephone Encounter (Signed)
-----   Message from Antoine Poche, MD sent at 04/20/2015  9:38 AM EDT ----- [place] of legs is negative for any blood clots. With her leg swelling and SOB I'd like to get an echo  Dominga Ferry MD

## 2015-04-24 ENCOUNTER — Ambulatory Visit (INDEPENDENT_AMBULATORY_CARE_PROVIDER_SITE_OTHER): Payer: Medicare Other

## 2015-04-24 DIAGNOSIS — R002 Palpitations: Secondary | ICD-10-CM | POA: Diagnosis not present

## 2015-04-28 ENCOUNTER — Ambulatory Visit (HOSPITAL_COMMUNITY)
Admission: RE | Admit: 2015-04-28 | Discharge: 2015-04-28 | Disposition: A | Discharge status: Home / Self Care | Payer: Medicare Other | Source: Ambulatory Visit | Attending: Cardiology | Admitting: Cardiology

## 2015-04-28 DIAGNOSIS — R0602 Shortness of breath: Secondary | ICD-10-CM | POA: Diagnosis not present

## 2015-04-28 DIAGNOSIS — M7989 Other specified soft tissue disorders: Secondary | ICD-10-CM | POA: Insufficient documentation

## 2015-05-12 ENCOUNTER — Ambulatory Visit (INDEPENDENT_AMBULATORY_CARE_PROVIDER_SITE_OTHER): Payer: Medicare Other | Admitting: Adult Health

## 2015-05-12 ENCOUNTER — Encounter: Payer: Self-pay | Admitting: Adult Health

## 2015-05-12 VITALS — BP 122/72 | HR 90 | Ht 62.0 in | Wt 159.0 lb

## 2015-05-12 DIAGNOSIS — R0609 Other forms of dyspnea: Secondary | ICD-10-CM | POA: Diagnosis not present

## 2015-05-12 DIAGNOSIS — R0600 Dyspnea, unspecified: Secondary | ICD-10-CM | POA: Insufficient documentation

## 2015-05-12 NOTE — Progress Notes (Signed)
Name: Abigail Gill    DOB: 1961-01-20  Age: 54 y.o.  MR#: [id number]       PCP:  Ignatius Specking., MD      Insurance: Payor: [employer] MEDICARE / Plan: [facility] MEDICARE / Product Type: *No Product type* /   CC:   No chief complaint on file.   VS Filed Vitals:   05/12/15 1534  BP: 122/72  Pulse: 90  Height:  (1.575 m)  Weight: 159 lb (72.122 kg)  SpO2: 99%    Weights Current Weight  05/12/15 159 lb (72.122 kg)  04/19/15 160 lb (72.576 kg)  08/26/11 155 lb (70.308 kg)    Blood Pressure  BP Readings from Last 3 Encounters:  05/12/15 122/72  04/19/15 130/80  08/30/11 111/71     Admit date:  (Not on file) Last encounter with RMR:  Visit date not found   Allergy Review of patient's allergies indicates no known allergies.  Current Outpatient Prescriptions  Medication Sig Dispense Refill  . albuterol (PROVENTIL HFA;VENTOLIN HFA) 108 (90 BASE) MCG/ACT inhaler Inhale 2 puffs into the lungs every 4 (four) hours as needed. For shortness of breath     . cetirizine (ZYRTEC) 10 MG tablet Take 10 mg by mouth at bedtime.      . Fluticasone-Salmeterol (ADVAIR) 250-50 MCG/DOSE AEPB Inhale 1 puff into the lungs every 12 (twelve) hours.      Marland Kitchen tiZANidine (ZANAFLEX) 4 MG tablet Take 4 mg by mouth as needed.      No current facility-administered medications for this visit.    Discontinued Meds:   There are no discontinued medications.  There are no active problems to display for this patient.   LABS    Component Value Date/Time   NA 138 08/26/2011 0823   K 4.2 08/26/2011 0823   CL 100 08/26/2011 0823   CO2 28 08/26/2011 0823   GLUCOSE 97 08/26/2011 0823   BUN 15 08/26/2011 0823   CREATININE 0.74 08/26/2011 0823   CALCIUM 10.6* 08/26/2011 0823   GFRNONAA >90 08/26/2011 0823   GFRAA >90 08/26/2011 0823   CMP     Component Value Date/Time   NA 138 08/26/2011 0823   K 4.2 08/26/2011 0823   CL 100 08/26/2011 0823   CO2 28 08/26/2011 0823   GLUCOSE 97 08/26/2011 0823    BUN 15 08/26/2011 0823   CREATININE 0.74 08/26/2011 0823   CALCIUM 10.6* 08/26/2011 0823   GFRNONAA >90 08/26/2011 0823   GFRAA >90 08/26/2011 0823       Component Value Date/Time   WBC 6.8 08/26/2011 0823   HGB 13.6 08/26/2011 0823   HCT 40.5 08/26/2011 0823   MCV 88.6 08/26/2011 0823    Lipid Panel  No results found for: CHOL, TRIG, HDL, CHOLHDL, VLDL, LDLCALC, LDLDIRECT  ABG No results found for: PHART, PCO2ART, PO2ART, HCO3, TCO2, ACIDBASEDEF, O2SAT   No results found for: TSH BNP (last 3 results) No results for input(s): BNP in the last 8760 hours.  ProBNP (last 3 results) No results for input(s): PROBNP in the last 8760 hours.  Cardiac Panel (last 3 results) No results for input(s): CKTOTAL, CKMB, TROPONINI, RELINDX in the last 72 hours.  Iron/TIBC/Ferritin/ %Sat No results found for: IRON, TIBC, FERRITIN, IRONPCTSAT   EKG Orders placed or performed in visit on 04/19/15  . EKG 12-Lead     Prior Assessment and Plan    Imaging: US Venous Img Lower Bilateral  04/19/2015   CLINICAL DATA:  Leg swelling  of the lower extremities.  EXAM: BILATERAL LOWER EXTREMITY VENOUS DOPPLER ULTRASOUND  TECHNIQUE: Gray-scale sonography with graded compression, as well as color Doppler and duplex ultrasound were performed to evaluate the lower extremity deep venous systems from the level of the common femoral vein and including the common femoral, femoral, profunda femoral, popliteal and calf veins including the posterior tibial, peroneal and gastrocnemius veins when visible. The superficial great saphenous vein was also interrogated. Spectral Doppler was utilized to evaluate flow at rest and with distal augmentation maneuvers in the common femoral, femoral and popliteal veins.  COMPARISON:  None.  FINDINGS: RIGHT LOWER EXTREMITY  Common Femoral Vein: No evidence of thrombus. Normal compressibility, respiratory phasicity and response to augmentation.  Saphenofemoral Junction: No evidence  of thrombus. Normal compressibility and flow on color Doppler imaging.  Profunda Femoral Vein: No evidence of thrombus. Normal compressibility and flow on color Doppler imaging.  Femoral Vein: No evidence of thrombus. Normal compressibility, respiratory phasicity and response to augmentation.  Popliteal Vein: No evidence of thrombus. Normal compressibility, respiratory phasicity and response to augmentation.  Calf Veins: No evidence of thrombus. Normal compressibility and flow on color Doppler imaging.  Superficial Great Saphenous Vein: No evidence of thrombus. Normal compressibility and flow on color Doppler imaging.  LEFT LOWER EXTREMITY  Common Femoral Vein: No evidence of thrombus. Normal compressibility, respiratory phasicity and response to augmentation.  Saphenofemoral Junction: No evidence of thrombus. Normal compressibility and flow on color Doppler imaging.  Profunda Femoral Vein: No evidence of thrombus. Normal compressibility and flow on color Doppler imaging.  Femoral Vein: No evidence of thrombus. Normal compressibility, respiratory phasicity and response to augmentation.  Popliteal Vein: No evidence of thrombus. Normal compressibility, respiratory phasicity and response to augmentation.  Calf Veins: No evidence of thrombus. Normal compressibility and flow on color Doppler imaging.  Superficial Great Saphenous Vein: No evidence of thrombus. Normal compressibility and flow on color Doppler imaging.  IMPRESSION: No evidence of deep venous thrombosis within the lower extremities.   Electronically Signed   By: Genevive Bi M.D.   On: 04/19/2015 17:07

## 2015-05-12 NOTE — Progress Notes (Signed)
Cardiology Office Note   Date:  05/12/2015   ID:  Abigail, Gill 1960-10-17, MRN [id number]  PCP:  Ignatius Specking., MD  Cardiologist:  Arlington Calix, NP   No chief complaint on file.     History of Present Illness: Abigail Gill is a 54 y.o. female who presents for ongoing assessment and management of palpitations, LEE, with hx of COPD and GERD. She was last seen by Dr. Wyline Mood on 04/19/2015. A cardiac monitor was placed for 14 days, labs were requested from PCP, and possible doppler studies of the LE were ordered due to FH of DVT.   Doppler study was negative for DVT. An echo was completed on 04/28/2015:   Left ventricle: The cavity size was normal. Wall thickness was normal. Systolic function was vigorous. The estimated ejection fraction was in the range of 65% to 70%. Images were inadequate for LV wall motion assessment. No obvious abnormalties on apical-4 chamber views. Doppler parameters are consistent with abnormal left ventricular relaxation (grade 1 diastolic dysfunction).   Labs are not available. Cardiac event monitor results Tracings show normal sinus rhythm  No symptoms reported             No significant arrhythmias  She comes today without complaints.   Past Medical History  Diagnosis Date  . COPD (chronic obstructive pulmonary disease)   . Shortness of breath     with exertion  . GERD (gastroesophageal reflux disease)     Past Surgical History  Procedure Laterality Date  . Breast enhancement surgery  1996    bilateral-[place]  . Tubal ligation  1985    [facility]  . Breast lumpectomy  08/30/2011    Procedure: LUMPECTOMY;  Surgeon: Fabio Bering;  Location: [facility];  Service: General;  Laterality: Left;     Current Outpatient Prescriptions  Medication Sig Dispense Refill  . albuterol (PROVENTIL HFA;VENTOLIN HFA) 108 (90 BASE) MCG/ACT inhaler Inhale 2 puffs into the lungs every 4 (four) hours as needed. For shortness of  breath     . cetirizine (ZYRTEC) 10 MG tablet Take 10 mg by mouth at bedtime.      . Fluticasone-Salmeterol (ADVAIR) 250-50 MCG/DOSE AEPB Inhale 1 puff into the lungs every 12 (twelve) hours.      Marland Kitchen tiZANidine (ZANAFLEX) 4 MG tablet Take 4 mg by mouth as needed.      No current facility-administered medications for this visit.    Allergies:   Review of patient's allergies indicates no known allergies.    Social History:  The patient  reports that she quit smoking about 6 years ago. Her smoking use included Cigarettes. She has a 25 pack-year smoking history. She does not have any smokeless tobacco history on file. She reports that she does not drink alcohol or use illicit drugs.   Family History:  The patient's family history is negative for Anesthesia problems, Hypotension, Malignant hyperthermia, and Pseudochol deficiency.    ROS: .   All other systems are reviewed and negative.Unless otherwise mentioned in H&P above.   PHYSICAL EXAM: VS:  There were no vitals taken for this visit. , BMI There is no weight on file to calculate BMI. GEN: Well nourished, well developed, in no acute distress HEENT: normal Neck: no JVD, carotid bruits, or masses Cardiac: RRR; no murmurs, rubs, or gallops,no edema  Respiratory:  clear to auscultation bilaterally, normal work of breathing GI: soft, nontender, nondistended, + BS MS: no deformity or atrophy Skin: warm and dry,  no rash Neuro:  Strength and sensation are intact Psych: euthymic mood, full affect   Lipid Panel No results found for: CHOL, TRIG, HDL, CHOLHDL, VLDL, LDLCALC, LDLDIRECT    Wt Readings from Last 3 Encounters:  04/19/15 160 lb (72.576 kg)  08/26/11 155 lb (70.308 kg)      Other studies Reviewed: Additional studies/ records that were reviewed today include: None Review of the above records demonstrates: N/A   ASSESSMENT AND PLAN:  1.  Dyspnea: Echocardiogram and cardiac monitor are normal I have given her reassurance.  She will continue to use inhalers provided by Dr. Sherril Croon. Will see her in one year unless she becomes symptomatic.   2. LEE: No overt LEE. She is feeling better. Doppler study is negative for DVT per radiology on 04/19/2015. Can consider prn HCTZ for edema. She is on steroid inhalers which may be contributing.    Current medicines are reviewed at length with the patient today.    Labs/ tests ordered today include: None No orders of the defined types were placed in this encounter.     Disposition:   FU with cardiology in one year.  Signed, Joni Reining, NP  05/12/2015 7:16 AM    [facility] [address] [place], [place] [address] Phone: [phone]; Fax: [phone]

## 2015-05-12 NOTE — Patient Instructions (Signed)
Your physician wants you to follow-up in: 1 year. You will receive a reminder letter in the mail two months in advance. If you don't receive a letter, please call our office to schedule the follow-up appointment.  Your physician recommends that you continue on your current medications as directed. Please refer to the Current Medication list given to you today.  Thank you for choosing [facility]!!    

## 2015-11-30 LAB — HM PAP SMEAR: HM Pap smear: NORMAL

## 2016-05-30 ENCOUNTER — Ambulatory Visit (INDEPENDENT_AMBULATORY_CARE_PROVIDER_SITE_OTHER): Payer: Medicare Other | Admitting: Adult Health

## 2016-05-30 ENCOUNTER — Encounter: Payer: Self-pay | Admitting: Adult Health

## 2016-05-30 VITALS — BP 134/76 | HR 74 | Ht 62.0 in | Wt 162.0 lb

## 2016-05-30 DIAGNOSIS — R002 Palpitations: Secondary | ICD-10-CM

## 2016-05-30 NOTE — Patient Instructions (Signed)
Your physician wants you to follow-up in: 1 Year with Dr. [place].  You will receive a reminder letter in the mail two months in advance. If you don't receive a letter, please call our office to schedule the follow-up appointment.  Your physician recommends that you continue on your current medications as directed. Please refer to the Current Medication list given to you today.  If you need a refill on your cardiac medications before your next appointment, please call your pharmacy.  Thank you for choosing [facility]!

## 2016-05-30 NOTE — Progress Notes (Signed)
Cardiology Office Note   Date:  05/30/2016   ID:  Abigail Gill, DOB [birth year], MRN [id number]  PCP:  Abigail Speckinghruv B Vyas, MD  Cardiologist: Abigail Gill/  Abigail Nyce, NP   Chief Complaint  Patient presents with  . Shortness of Breath      History of Present Illness: Abigail Gill is a 55 y.o. female who presents for ongoing assessment and management of palpitations, chronic lower extremity edema, with known history of COPD and GERD. This recent echocardiogram revealed normal LV systolic function with grade 1 diastolic dysfunction. She had a recent cardiac event monitor that was negative for palpitations. On last office visit 05/12/2015 she had continued complaints of chronic dyspnea. She is here for one year follow-up.  She comes today without any new complaints. She has not had any new diagnoses, ER visits, surgeries, or hospitalizations. Her breathing status remained stable.Labs are followed by her primary care physician in [place]  Echocardiogram 04/27/2016 Procedure narrative: Transthoracic echocardiography. Image   quality was suboptimal, with poor valvular visualization. - Left ventricle: The cavity size was normal. Wall thickness was   normal. Systolic function was vigorous. The estimated ejection   fraction was in the range of 65% to 70%. Images were inadequate   for LV wall motion assessment. No obvious abnormalties on   apical-4 chamber views. Doppler parameters are consistent with   abnormal left ventricular relaxation (grade 1 diastolic   dysfunction).  Past Medical History:  Diagnosis Date  . COPD (chronic obstructive pulmonary disease) (HCC)   . GERD (gastroesophageal reflux disease)   . Shortness of breath    with exertion    Past Surgical History:  Procedure Laterality Date  . BREAST ENHANCEMENT SURGERY  1996   bilateral-[place]  . BREAST LUMPECTOMY  08/30/2011   Procedure: LUMPECTOMY;  Surgeon: Fabio BeringBrent C Ziegler;  Location: [facility];  Service: General;   Laterality: Left;  . TUBAL LIGATION  1985   Abigail Gill     Current Outpatient Prescriptions  Medication Sig Dispense Refill  . albuterol (PROVENTIL HFA;VENTOLIN HFA) 108 (90 BASE) MCG/ACT inhaler Inhale 2 puffs into the lungs every 4 (four) hours as needed. For shortness of breath     . cetirizine (ZYRTEC) 10 MG tablet Take 10 mg by mouth at bedtime.      . Fluticasone-Salmeterol (ADVAIR) 250-50 MCG/DOSE AEPB Inhale 1 puff into the lungs every 12 (twelve) hours.       No current facility-administered medications for this visit.     Allergies:   Review of patient's allergies indicates no known allergies.    Social History:  The patient  reports that she quit smoking about 7 years ago. Her smoking use included Cigarettes. She has a 25.00 pack-year smoking history. She has never used smokeless tobacco. She reports that she does not drink alcohol or use drugs.   Family History:  The patient's family history is not on file.    ROS: All other systems are reviewed and negative. Unless otherwise mentioned in H&P    PHYSICAL EXAM: VS:  BP 134/76   Pulse 74   Ht 5\' 2"  (1.575 m)   Wt 162 lb (73.5 kg)   SpO2 95%   BMI 29.63 kg/m  , BMI Body mass index is 29.63 kg/m. GEN: Well nourished, well developed, in no acute distress  HEENT: normal  Neck: no JVD, carotid bruits, or masses Cardiac: RRR; no murmurs, rubs, or gallops,no edema  Respiratory:  clear to auscultation bilaterally, normal work of breathing  GI: soft, nontender, nondistended, + BS MS: no deformity or atrophy  Skin: warm and dry, no rash Neuro:  Strength and sensation are intact Psych: euthymic mood, full affect   EKG:  The ekg ordered today demonstrates normal sinus rhythm heart rate of 82 bpm   Recent Labs: No results found for requested labs within last 8760 hours.    Lipid Panel No results found for: CHOL, TRIG, HDL, CHOLHDL, VLDL, LDLCALC, LDLDIRECT    Wt Readings from Last 3 Encounters:  05/30/16 162 lb  (73.5 kg)  05/12/15 159 lb (72.1 kg)  04/19/15 160 lb (72.6 kg)      ASSESSMENT AND PLAN:  1.  Chronic dyspnea: Not found to be cardiac in etiology. The patient had a normal echo in August 2016 revealing normal LV systolic function. She did have grade 1 diastolic dysfunction. Will not make any changes. She will follow with her primary care physician. She will need to be seen in one year in [place] with Dr. branch is this is where she lives.  2. COPD: Follow with primary care.   Current medicines are reviewed at length with the patient today.    Labs/ tests ordered today include:   Orders Placed This Encounter  Procedures  . EKG 12-Lead     Disposition:   FU with  Dr. Wyline Mood in one year in the [facility] office  Signed, Joni Reining, NP  05/30/2016 1:33 PM    [facility] [address], [place], [place] [address] Phone: [phone]; Fax: [phone]

## 2016-05-30 NOTE — Progress Notes (Signed)
Name: Abigail Gill    DOB: [birth year]  Age: 55 y.o.  MR#: [id number]       PCP:  Ignatius Specking, MD      Insurance: Payor: [employer] MEDICARE / Plan: [facility] MEDICARE / Product Type: *No Product type* /   CC:   No chief complaint on file.   VS Vitals:   05/30/16 1308  BP: 134/76  Pulse: 74  SpO2: 95%  Weight: 162 lb (73.5 kg)  Height: 5\' 2"  (1.575 m)    Weights Current Weight  05/30/16 162 lb (73.5 kg)  05/12/15 159 lb (72.1 kg)  04/19/15 160 lb (72.6 kg)    Blood Pressure  BP Readings from Last 3 Encounters:  05/30/16 134/76  05/12/15 122/72  04/19/15 130/80     Admit date:  (Not on file) Last encounter with RMR:  Visit date not found   Allergy Review of patient's allergies indicates no known allergies.  Current Outpatient Prescriptions  Medication Sig Dispense Refill  . albuterol (PROVENTIL HFA;VENTOLIN HFA) 108 (90 BASE) MCG/ACT inhaler Inhale 2 puffs into the lungs every 4 (four) hours as needed. For shortness of breath     . cetirizine (ZYRTEC) 10 MG tablet Take 10 mg by mouth at bedtime.      . Fluticasone-Salmeterol (ADVAIR) 250-50 MCG/DOSE AEPB Inhale 1 puff into the lungs every 12 (twelve) hours.       No current facility-administered medications for this visit.     Discontinued Meds:    Medications Discontinued During This Encounter  Medication Reason  . tiZANidine (ZANAFLEX) 4 MG tablet Error    Patient Active Problem List   Diagnosis Date Noted  . Dyspnea on exertion 05/12/2015    LABS    Component Value Date/Time   NA 138 08/26/2011 0823   K 4.2 08/26/2011 0823   CL 100 08/26/2011 0823   CO2 28 08/26/2011 0823   GLUCOSE 97 08/26/2011 0823   BUN 15 08/26/2011 0823   CREATININE 0.74 08/26/2011 0823   CALCIUM 10.6 (H) 08/26/2011 0823   GFRNONAA >90 08/26/2011 0823   GFRAA >90 08/26/2011 0823   CMP     Component Value Date/Time   NA 138 08/26/2011 0823   K 4.2 08/26/2011 0823   CL 100 08/26/2011 0823   CO2 28 08/26/2011 0823   GLUCOSE 97 08/26/2011 0823   BUN 15 08/26/2011 0823   CREATININE 0.74 08/26/2011 0823   CALCIUM 10.6 (H) 08/26/2011 0823   GFRNONAA >90 08/26/2011 0823   GFRAA >90 08/26/2011 0823       Component Value Date/Time   WBC 6.8 08/26/2011 0823   HGB 13.6 08/26/2011 0823   HCT 40.5 08/26/2011 0823   MCV 88.6 08/26/2011 0823    Lipid Panel  No results found for: CHOL, TRIG, HDL, CHOLHDL, VLDL, LDLCALC, LDLDIRECT  ABG No results found for: PHART, PCO2ART, PO2ART, HCO3, TCO2, ACIDBASEDEF, O2SAT   No results found for: TSH BNP (last 3 results) No results for input(s): BNP in the last 8760 hours.  ProBNP (last 3 results) No results for input(s): PROBNP in the last 8760 hours.  Cardiac Panel (last 3 results) No results for input(s): CKTOTAL, CKMB, TROPONINI, RELINDX in the last 72 hours.  Iron/TIBC/Ferritin/ %Sat No results found for: IRON, TIBC, FERRITIN, IRONPCTSAT   EKG Orders placed or performed in visit on 05/30/16  . EKG 12-Lead     Prior Assessment and Plan Problem List as of 05/30/2016 Reviewed: 05/12/2015  4:00 PM by Joni Reining,  NP     Other   Dyspnea on exertion       Imaging: No results found.

## 2016-09-16 HISTORY — PX: KYPHOPLASTY: SHX5884

## 2016-12-05 DIAGNOSIS — S32040A Wedge compression fracture of fourth lumbar vertebra, initial encounter for closed fracture: Secondary | ICD-10-CM | POA: Insufficient documentation

## 2017-06-02 LAB — HM DEXA SCAN: HM Dexa Scan: ABNORMAL

## 2017-07-07 ENCOUNTER — Ambulatory Visit (INDEPENDENT_AMBULATORY_CARE_PROVIDER_SITE_OTHER): Payer: Medicare Other | Admitting: Family Medicine

## 2017-07-07 ENCOUNTER — Encounter: Payer: Self-pay | Admitting: Family Medicine

## 2017-07-07 DIAGNOSIS — M8000XD Age-related osteoporosis with current pathological fracture, unspecified site, subsequent encounter for fracture with routine healing: Secondary | ICD-10-CM | POA: Diagnosis not present

## 2017-07-07 DIAGNOSIS — Z87891 Personal history of nicotine dependence: Secondary | ICD-10-CM | POA: Insufficient documentation

## 2017-07-07 DIAGNOSIS — Z9109 Other allergy status, other than to drugs and biological substances: Secondary | ICD-10-CM | POA: Insufficient documentation

## 2017-07-07 DIAGNOSIS — Z23 Encounter for immunization: Secondary | ICD-10-CM | POA: Diagnosis not present

## 2017-07-07 DIAGNOSIS — J431 Panlobular emphysema: Secondary | ICD-10-CM | POA: Diagnosis not present

## 2017-07-07 DIAGNOSIS — J439 Emphysema, unspecified: Secondary | ICD-10-CM | POA: Insufficient documentation

## 2017-07-07 HISTORY — DX: Personal history of nicotine dependence: Z87.891

## 2017-07-07 NOTE — Progress Notes (Signed)
Chief Complaint  Patient presents with  . COPD  New patient to establish. Long history of COPD.  She smoked a pack a day for 30 years.  She states that she quit in 2010.  She still is on Advair daily with as needed albuterol for chronic dyspnea.  She can walk about a block without becoming short of breath.  She is not under the care of pulmonology. She has not history of osteoporosis.  She is not on any medication for osteoporosis.  Further she has limited weightbearing exercise.  She has had pathologic fracture with lumbar compression fracture and kyphoplasty performed in April 2018.  She is intolerant of Fosamax, she states she took 1 dose and vomited forcefully within minutes.  She has never been tried on another osteoporosis treatment.  She tries to get enough calcium in her diet.  She does take vitamin D 1000 units daily.  She has not been worked up for any metabolic disease or because of her osteoporosis.  She states it runs in her family.  She has never been on long-term steroids.  No history of hypothyroidism.  Her only risk factor appears to be cigarette smoking.  She went through normal menopause at the age of 58-50. She has environmental allergies.  She takes over-the-counter Claritin or Zyrtec (what ever is on sale). She states her colon cancer screening is up to date.  Pap smear 2 years ago.  She has not had a mammogram since 2012.  He states that it was traumatic and she has chosen not to repeat.  She agrees to immunization update today.   Patient Active Problem List   Diagnosis Date Noted  . COPD with emphysema (HCC) 07/07/2017  . Former cigarette smoker 07/07/2017  . Osteoporosis with pathological fracture with routine healing 07/07/2017  . Environmental allergies 07/07/2017    Outpatient Encounter Prescriptions as of 07/07/2017  Medication Sig  . albuterol (PROVENTIL HFA;VENTOLIN HFA) 108 (90 BASE) MCG/ACT inhaler Inhale 2 puffs into the lungs every 4 (four) hours as needed.  For shortness of breath   . cetirizine (ZYRTEC) 10 MG tablet Take 10 mg by mouth at bedtime.    . cholecalciferol (VITAMIN D) 1000 units tablet Take 1,000 Units by mouth daily.  . Fluticasone-Salmeterol (ADVAIR) 250-50 MCG/DOSE AEPB Inhale 1 puff into the lungs every 12 (twelve) hours.     No facility-administered encounter medications on file as of 07/07/2017.     Past Medical History:  Diagnosis Date  . Asthma   . COPD (chronic obstructive pulmonary disease) (HCC)   . GERD (gastroesophageal reflux disease)   . Osteoporosis   . Shortness of breath    with exertion    Past Surgical History:  Procedure Laterality Date  . BREAST ENHANCEMENT SURGERY  1996   bilateral-[place]  . BREAST LUMPECTOMY  08/30/2011   Procedure: LUMPECTOMY;  Surgeon: Fabio Bering;  Location: [facility];  Service: General;  Laterality: Left;  . KYPHOPLASTY  2018  . TUBAL LIGATION  1985   [name]    Social History   Social History  . Marital status: Married    Spouse name: [name]  . Number of children: 3  . Years of education: 19   Occupational History  . disabled     COPD   Social History Main Topics  . Smoking status: Former Smoker    Packs/day: 1.00    Years: 30.00    Types: Cigarettes    Start date: 09/16/1976  Quit date: 04/18/2009  . Smokeless tobacco: Never Used  . Alcohol use No  . Drug use: No  . Sexual activity: Yes   Other Topics Concern  . Not on file   Social History Narrative   Disabled due to COPD   Was a pharmacy tech   Lives at home with [name]   2 chihuahuas       Family History  Problem Relation Age of Onset  . Alcohol abuse Mother   . Arthritis Mother   . COPD Mother   . Hyperlipidemia Mother   . Pancreatitis Mother   . Heart failure Father   . Alcohol abuse Father   . Arthritis Father   . Diabetes Father   . Heart disease Father   . Hyperlipidemia Father   . Hypertension Father   . Kidney disease Father   . Stroke Father   . COPD Sister     . Anesthesia problems Neg Hx   . Hypotension Neg Hx   . Malignant hyperthermia Neg Hx   . Pseudochol deficiency Neg Hx     Review of Systems  Constitutional: Negative for chills, fever and weight loss.  HENT: Negative for congestion and hearing loss.   Eyes: Negative for blurred vision and pain.  Respiratory: Positive for shortness of breath. Negative for cough.   Cardiovascular: Negative for chest pain and leg swelling.  Gastrointestinal: Negative for abdominal pain, constipation, diarrhea and heartburn.  Genitourinary: Negative for dysuria and frequency.  Musculoskeletal: Negative for falls, joint pain and myalgias.  Neurological: Negative for dizziness, seizures and headaches.  Psychiatric/Behavioral: Negative for depression. The patient is not nervous/anxious and does not have insomnia.     BP 136/78   Pulse 76   Temp 98.1 F (36.7 C) (Temporal)   Resp 16   Ht 5\' 2"  (1.575 m)   Wt 159 lb (72.1 kg)   SpO2 93%   BMI 29.08 kg/m   Physical Exam  Constitutional: She is oriented to person, place, and time. She appears well-developed and well-nourished.  HENT:  Head: Normocephalic and atraumatic.  Mouth/Throat: Oropharynx is clear and moist.  Eyes: Pupils are equal, round, and reactive to light. Conjunctivae are normal.  Neck: Normal range of motion. Neck supple. No thyromegaly present.  Cardiovascular: Normal rate, regular rhythm and normal heart sounds.   Pulmonary/Chest: Effort normal and breath sounds normal. No respiratory distress.  Abdominal: Soft. Bowel sounds are normal.  Musculoskeletal: Normal range of motion. She exhibits no edema.  No kyphosis.  No tenderness thoracic or lumbar spine  Lymphadenopathy:    She has no cervical adenopathy.  Neurological: She is alert and oriented to person, place, and time.  Gait normal  Skin: Skin is warm and dry.  Psychiatric: She has a normal mood and affect. Her behavior is normal. Thought content normal.  Nursing note and  vitals reviewed. ASSESSMENT/PLAN:   1. Panlobular emphysema (HCC)  2. Former cigarette smoker  3. Osteoporosis with pathological fracture with routine healing  - CBC - COMPLETE METABOLIC PANEL WITH GFR - Lipid panel - Urinalysis, Routine w reflex microscopic - VITAMIN D 25 Hydroxy (Vit-D Deficiency, Fractures) - TSH  4. Environmental allergies    Patient Instructions  Need old records PCP Need old records for DEXA scan Need recent lab testing that is available  Stay as active as you can manage  Labs ordered I will send you a letter with your test results.  If there is anything of concern, we will call right  away.  See me in a month for a PE   Eustace MooreYvonne Sue Halie Gass, MD

## 2017-07-07 NOTE — Patient Instructions (Signed)
Need old records PCP Need old records for DEXA scan Need recent lab testing that is available  Stay as active as you can manage  Labs ordered I will send you a letter with your test results.  If there is anything of concern, we will call right away.  See me in a month for a PE

## 2017-07-08 ENCOUNTER — Encounter: Payer: Self-pay | Admitting: Family Medicine

## 2017-07-08 ENCOUNTER — Ambulatory Visit: Payer: Medicare Other | Admitting: Family Medicine

## 2017-07-08 LAB — URINALYSIS, ROUTINE W REFLEX MICROSCOPIC
Bilirubin Urine: NEGATIVE
Glucose, UA: NEGATIVE
Hgb urine dipstick: NEGATIVE
Ketones, ur: NEGATIVE
Leukocytes, UA: NEGATIVE
Nitrite: NEGATIVE
Protein, ur: NEGATIVE
Specific Gravity, Urine: 1.016 (ref 1.001–1.03)
pH: 6.5 (ref 5.0–8.0)

## 2017-07-08 LAB — LIPID PANEL
Cholesterol: 194 mg/dL (ref <200)
HDL: 68 mg/dL (ref >50)
LDL Cholesterol (Calc): 104 mg/dL (calc) — ABNORMAL HIGH
Non-HDL Cholesterol (Calc): 126 mg/dL (calc) (ref <130)
Total CHOL/HDL Ratio: 2.9 (calc) (ref <5.0)
Triglycerides: 122 mg/dL (ref <150)

## 2017-07-08 LAB — CBC
HCT: 39.4 % (ref 35.0–45.0)
Hemoglobin: 13.4 g/dL (ref 11.7–15.5)
MCH: 28.9 pg (ref 27.0–33.0)
MCHC: 34 g/dL (ref 32.0–36.0)
MCV: 85.1 fL (ref 80.0–100.0)
MPV: 10 fL (ref 7.5–12.5)
Platelets: 264 10*3/uL (ref 140–400)
RBC: 4.63 10*6/uL (ref 3.80–5.10)
RDW: 12.9 % (ref 11.0–15.0)
WBC: 5.9 10*3/uL (ref 3.8–10.8)

## 2017-07-08 LAB — COMPLETE METABOLIC PANEL WITH GFR
AG Ratio: 1.8 (calc) (ref 1.0–2.5)
ALT: 16 U/L (ref 6–29)
AST: 14 U/L (ref 10–35)
Albumin: 4.5 g/dL (ref 3.6–5.1)
Alkaline phosphatase (APISO): 98 U/L (ref 33–130)
BUN: 15 mg/dL (ref 7–25)
CO2: 30 mmol/L (ref 20–32)
Calcium: 9.5 mg/dL (ref 8.6–10.4)
Chloride: 103 mmol/L (ref 98–110)
Creat: 0.75 mg/dL (ref 0.50–1.05)
GFR, Est African American: 102 mL/min/{1.73_m2} (ref >=60)
GFR, Est Non African American: 88 mL/min/{1.73_m2} (ref >=60)
Globulin: 2.5 g/dL (calc) (ref 1.9–3.7)
Glucose, Bld: 102 mg/dL (ref 65–139)
Potassium: 3.9 mmol/L (ref 3.5–5.3)
Sodium: 141 mmol/L (ref 135–146)
Total Bilirubin: 0.5 mg/dL (ref 0.2–1.2)
Total Protein: 7 g/dL (ref 6.1–8.1)

## 2017-07-08 LAB — TSH: TSH: 2.24 mIU/L (ref 0.40–4.50)

## 2017-07-08 LAB — VITAMIN D 25 HYDROXY (VIT D DEFICIENCY, FRACTURES): Vit D, 25-Hydroxy: 26 ng/mL — ABNORMAL LOW (ref 30–100)

## 2017-08-12 ENCOUNTER — Other Ambulatory Visit: Payer: Self-pay

## 2017-08-12 ENCOUNTER — Ambulatory Visit (INDEPENDENT_AMBULATORY_CARE_PROVIDER_SITE_OTHER): Payer: Medicare Other | Admitting: Family Medicine

## 2017-08-12 ENCOUNTER — Encounter: Payer: Self-pay | Admitting: Family Medicine

## 2017-08-12 VITALS — BP 136/70 | HR 76 | Temp 97.6°F | Resp 16 | Ht 62.0 in | Wt 157.1 lb

## 2017-08-12 DIAGNOSIS — M8000XD Age-related osteoporosis with current pathological fracture, unspecified site, subsequent encounter for fracture with routine healing: Secondary | ICD-10-CM

## 2017-08-12 DIAGNOSIS — E559 Vitamin D deficiency, unspecified: Secondary | ICD-10-CM

## 2017-08-12 DIAGNOSIS — G8929 Other chronic pain: Secondary | ICD-10-CM | POA: Diagnosis not present

## 2017-08-12 DIAGNOSIS — M545 Low back pain: Secondary | ICD-10-CM

## 2017-08-12 DIAGNOSIS — M546 Pain in thoracic spine: Secondary | ICD-10-CM

## 2017-08-12 DIAGNOSIS — M549 Dorsalgia, unspecified: Secondary | ICD-10-CM

## 2017-08-12 MED ORDER — CYCLOBENZAPRINE HCL 5 MG PO TABS: 5.0000 mg | ORAL_TABLET | Freq: Three times a day (TID) | ORAL | 1 refills | Status: DC | PRN

## 2017-08-12 MED ORDER — DULOXETINE HCL 20 MG PO CPEP: 20.0000 mg | ORAL_CAPSULE | Freq: Every day | ORAL | 2 refills | Status: DC

## 2017-08-12 MED ORDER — NABUMETONE 500 MG PO TABS: 500.0000 mg | ORAL_TABLET | Freq: Two times a day (BID) | ORAL | 2 refills | Status: DC

## 2017-08-12 NOTE — Patient Instructions (Addendum)
I am prescribing medicine for osteoporosis- Reclast  We will call you with the infusion information. Make sure you are getting enough calcium in your diet, or taking a calcium supplement Make sure that you are taking vitamin D daily at least 2000 units  For your chronic back pain I recommend a daily anti-inflammatory pain medication I am prescribing nabumetone 1 pill twice a day with food While on this medicine do not take other anti-inflammatories such as ibuprofen or naproxen Let me know if this upsets your stomach I am also prescribing cyclobenzaprine a mild muscle relaxer to use as needed at night.  This may help you sleep I am also prescribing duloxetine which, as we discussed, is used for a number of chronic pain conditions.  Take 1 pill a day in the morning.  This will start to take effect slowly, within a month you should feel better. I have given you a 4291-month supply, do not run out of your medications.  I need to see you back in 2-3 months to see how these medicines are working.  You need to walk every day that you are able, to your tolerance, to improve your physical condition and reduce her back pain.

## 2017-08-12 NOTE — Progress Notes (Signed)
Chief Complaint  Patient presents with  . Annual Exam   Patient is here for a reevaluation. We reviewed her recent blood work results.  Everything is normal or as expected except for vitamin D deficiency.  I discussed with her the importance of increasing her vitamin D.  Vitamin D is necessary for healthy bones.  She already has osteoporosis.  She has had a kyphoplasty for a lumbar fracture.  She needs to be careful about her calcium intake, her vitamin D supplementation, and daily weightbearing exercise to keep her bones healthy.  She failed Fosamax and Boniva because of GI distress, abdominal pain, and vomiting.  I have recommended that we set her up for yearly zoledronic acid infusions.  She agrees. She has chronic low back pain from lumbar degenerative disc disease.  She has not on any medication for low back pain.  She states it bothers her on a daily basis.  She states this is the most important thing to her to get treatment for.  I told her that I would not offer her opioid therapy for her chronic low back pain, however, she can take anti-inflammatory drugs, muscle relaxers, antidepressants.  We discussed the pros and cons of these medications and she wishes to try to get additional pain relief so she can increase her activity level. Her COPD is stable.  She is compliant with her inhalers.  She stays away from fumes and cigarette smoke.  Again, she needs to exercise to improve her pulmonary function and capacity. She refuses mammograms.  She refuses colonoscopy.  Her DEXA scan is up-to-date.  Immunizations are updated.   Patient Active Problem List   Diagnosis Date Noted  . Chronic back pain 08/12/2017  . Vitamin D deficiency 08/12/2017  . COPD with emphysema (HCC) 07/07/2017  . Former cigarette smoker 07/07/2017  . Osteoporosis with pathological fracture with routine healing 07/07/2017  . Environmental allergies 07/07/2017    Outpatient Encounter Medications as of 08/12/2017    Medication Sig  . albuterol (PROVENTIL HFA;VENTOLIN HFA) 108 (90 BASE) MCG/ACT inhaler Inhale 2 puffs into the lungs every 4 (four) hours as needed. For shortness of breath   . cetirizine (ZYRTEC) 10 MG tablet Take 10 mg by mouth at bedtime.    . cholecalciferol (VITAMIN D) 1000 units tablet Take 1,000 Units by mouth daily.  . Fluticasone-Salmeterol (ADVAIR) 250-50 MCG/DOSE AEPB Inhale 1 puff into the lungs every 12 (twelve) hours.    . cyclobenzaprine (FLEXERIL) 5 MG tablet Take 1 tablet (5 mg total) by mouth 3 (three) times daily as needed for muscle spasms.  . DULoxetine (CYMBALTA) 20 MG capsule Take 1 capsule (20 mg total) by mouth daily.  . nabumetone (RELAFEN) 500 MG tablet Take 1 tablet (500 mg total) by mouth 2 (two) times daily.   No facility-administered encounter medications on file as of 08/12/2017.     Allergies  Allergen Reactions  . Fosamax [Alendronate Sodium] Nausea And Vomiting    Vomiting within minutes of taking    Review of Systems  Constitutional: Negative for activity change, appetite change and unexpected weight change.  HENT: Negative for congestion, dental problem, postnasal drip and rhinorrhea.        Dentures  Eyes: Negative for redness and visual disturbance.  Respiratory: Positive for shortness of breath. Negative for cough.        Known COPD  Cardiovascular: Negative for chest pain, palpitations and leg swelling.  Gastrointestinal: Negative for abdominal pain, constipation and diarrhea.  Genitourinary:  Negative for difficulty urinating, frequency and vaginal bleeding.  Musculoskeletal: Positive for back pain. Negative for arthralgias.       Chronic mid and lower back pain.  Infrequent sciatica  Neurological: Negative for dizziness and headaches.  Psychiatric/Behavioral: Negative for dysphoric mood and sleep disturbance. The patient is not nervous/anxious.     BP 136/70 (BP Location: Right Arm, Patient Position: Sitting, Cuff Size: Normal)   Pulse 76    Temp 97.6 F (36.4 C) (Temporal)   Resp 16   Ht 5\' 2"  (1.575 m)   Wt 157 lb 1.9 oz (71.3 kg)   SpO2 92%   BMI 28.74 kg/m   Physical Exam  Constitutional: She is oriented to person, place, and time. She appears well-developed and well-nourished.  Appears older than stated age  HENT:  Head: Normocephalic and atraumatic.  Right Ear: External ear normal.  Left Ear: External ear normal.  Nose: Nose normal.  Mouth/Throat: Oropharynx is clear and moist.  Edentulous  Eyes: Conjunctivae are normal. Pupils are equal, round, and reactive to light.  Neck: Normal range of motion. Neck supple. No thyromegaly present.  Cardiovascular: Normal rate, regular rhythm and normal heart sounds.  Pulmonary/Chest: Effort normal and breath sounds normal. No respiratory distress.  No wheeze  Abdominal: Soft. Bowel sounds are normal.  No hepatosplenomegaly  Musculoskeletal: Normal range of motion. She exhibits no edema.  No kyphosis.  No tenderness thoracic or lumbar spine.  Slow but full range of motion.  Guarded movements  Lymphadenopathy:    She has no cervical adenopathy.  Neurological: She is alert and oriented to person, place, and time.  Gait normal  Skin: Skin is warm and dry.  Psychiatric: She has a normal mood and affect. Her behavior is normal. Thought content normal.  Nursing note and vitals reviewed.    ASSESSMENT/PLAN:  1. Chronic bilateral low back pain without sciatica Add Relafen with food as NSAID.  Add cyclobenzaprine 5 mg at bedtime also prescribed duloxetine 20 mg a day.  Medicines were discussed.  Side effects and expected results were reviewed.  2. Chronic midline thoracic back pain Patient request x-ray.  3. Osteoporosis with pathological fracture with routine healing Untreated.  Calcium, vitamin D, exercise discussed.  Will order zoledronic acid yearly.  4. Vitamin D deficiency Replaced  Greater than 50% of this visit was spent in counseling and coordinating care.   Total face to face time: 40 minutes discussing diet exercise lifestyle changes, treatment of osteoporosis, conservative management of low back pain     Patient Instructions  I am prescribing medicine for osteoporosis- Reclast  We will call you with the infusion information. Make sure you are getting enough calcium in your diet, or taking a calcium supplement Make sure that you are taking vitamin D daily at least 2000 units  For your chronic back pain I recommend a daily anti-inflammatory pain medication I am prescribing nabumetone 1 pill twice a day with food While on this medicine do not take other anti-inflammatories such as ibuprofen or naproxen Let me know if this upsets your stomach I am also prescribing cyclobenzaprine a mild muscle relaxer to use as needed at night.  This may help you sleep I am also prescribing duloxetine which, as we discussed, is used for a number of chronic pain conditions.  Take 1 pill a day in the morning.  This will start to take effect slowly, within a month you should feel better. I have given you a 3932-month supply, do not run  out of your medications.  I need to see you back in 2-3 months to see how these medicines are working.  You need to walk every day that you are able, to your tolerance, to improve your physical condition and reduce her back pain.    Eustace MooreYvonne Sue Janathan Bribiesca, MD

## 2017-10-08 ENCOUNTER — Ambulatory Visit: Payer: Medicare Other | Admitting: Family Medicine

## 2017-10-13 ENCOUNTER — Ambulatory Visit: Payer: Medicare Other | Admitting: Family Medicine

## 2017-10-16 ENCOUNTER — Ambulatory Visit (INDEPENDENT_AMBULATORY_CARE_PROVIDER_SITE_OTHER): Payer: Medicare Other | Admitting: Family Medicine

## 2017-10-16 ENCOUNTER — Other Ambulatory Visit: Payer: Self-pay

## 2017-10-16 ENCOUNTER — Encounter: Payer: Self-pay | Admitting: Family Medicine

## 2017-10-16 VITALS — BP 130/68 | HR 73 | Temp 97.9°F | Resp 16 | Ht 62.0 in | Wt 161.5 lb

## 2017-10-16 DIAGNOSIS — J431 Panlobular emphysema: Secondary | ICD-10-CM

## 2017-10-16 DIAGNOSIS — M8000XD Age-related osteoporosis with current pathological fracture, unspecified site, subsequent encounter for fracture with routine healing: Secondary | ICD-10-CM | POA: Diagnosis not present

## 2017-10-16 DIAGNOSIS — Z1231 Encounter for screening mammogram for malignant neoplasm of breast: Secondary | ICD-10-CM

## 2017-10-16 DIAGNOSIS — Z9109 Other allergy status, other than to drugs and biological substances: Secondary | ICD-10-CM | POA: Diagnosis not present

## 2017-10-16 DIAGNOSIS — Z1239 Encounter for other screening for malignant neoplasm of breast: Secondary | ICD-10-CM

## 2017-10-16 NOTE — Patient Instructions (Signed)
You are doing well  No change in medicine  Walk every day that you are able  I have ordered a mammogram  See me in six months

## 2017-10-16 NOTE — Progress Notes (Signed)
Chief Complaint  Patient presents with  . Follow-up   Patient is here for routine follow-up. She is compliant with her inhalers.  No increase in shortness of breath cough or COPD exacerbation. She has not been getting any exercises.  We discussed today her weight.  She is moderately overweight with a BMI of 29.  I discussed with her reducing her portions, fats, and carbohydrates to try to get to a weight closer to 150. Patient does have a history of osteoporosis.  Her most recent DEXA scan was in September of this year.  She is currently not being treated.  She needs osteoporosis treatment we discussed weightbearing exercise, calcium, vitamin D, and I recommend a Reclast injection once a year. She is overdue for mammogram.  She does agree today to a mammogram screening.  This is ordered She states that she has not bothered by allergies this time a year.  She does take her antihistamine year-round. At her last visit she was prescribed duloxetine for chronic pain and for some anxiety.  She got the medicine and then read the side effects.  She decided not to take it.  She states that she feels well with no back pain at this time.  Her back pain is intermittent.  Patient Active Problem List   Diagnosis Date Noted  . Chronic back pain 08/12/2017  . Vitamin D deficiency 08/12/2017  . COPD with emphysema (HCC) 07/07/2017  . Former cigarette smoker 07/07/2017  . Osteoporosis with pathological fracture with routine healing 07/07/2017  . Environmental allergies 07/07/2017    Outpatient Encounter Medications as of 10/16/2017  Medication Sig  . albuterol (PROVENTIL HFA;VENTOLIN HFA) 108 (90 BASE) MCG/ACT inhaler Inhale 2 puffs into the lungs every 4 (four) hours as needed. For shortness of breath   . cetirizine (ZYRTEC) 10 MG tablet Take 10 mg by mouth at bedtime.    . cholecalciferol (VITAMIN D) 1000 units tablet Take 1,000 Units by mouth daily.  . Fluticasone-Salmeterol (ADVAIR) 250-50 MCG/DOSE  AEPB Inhale 1 puff into the lungs every 12 (twelve) hours.    . nabumetone (RELAFEN) 500 MG tablet Take 1 tablet (500 mg total) by mouth 2 (two) times daily.  . [DISCONTINUED] cyclobenzaprine (FLEXERIL) 5 MG tablet Take 1 tablet (5 mg total) by mouth 3 (three) times daily as needed for muscle spasms. (Patient not taking: Reported on 10/16/2017)  . [DISCONTINUED] DULoxetine (CYMBALTA) 20 MG capsule Take 1 capsule (20 mg total) by mouth daily. (Patient not taking: Reported on 10/16/2017)   No facility-administered encounter medications on file as of 10/16/2017.     Allergies  Allergen Reactions  . Fosamax [Alendronate Sodium] Nausea And Vomiting    Vomiting within minutes of taking    Review of Systems  Constitutional: Negative for activity change, appetite change and unexpected weight change.  HENT: Negative for congestion, dental problem, postnasal drip and rhinorrhea.        Dentures  Eyes: Negative for redness and visual disturbance.  Respiratory: Positive for shortness of breath. Negative for cough.        Known COPD  Cardiovascular: Negative for chest pain, palpitations and leg swelling.  Gastrointestinal: Negative for abdominal pain, constipation and diarrhea.  Genitourinary: Negative for difficulty urinating, frequency and vaginal bleeding.  Musculoskeletal: Positive for back pain. Negative for arthralgias.       Chronic mid and lower back pain.  Symptoms are intermittent  Neurological: Negative for dizziness and headaches.  Psychiatric/Behavioral: Negative for dysphoric mood and sleep disturbance.  The patient is not nervous/anxious.     BP 130/68 (BP Location: Right Arm, Patient Position: Sitting, Cuff Size: Normal)   Pulse 73   Temp 97.9 F (36.6 C) (Temporal)   Resp 16   Ht 5\' 2"  (1.575 m)   Wt 161 lb 8 oz (73.3 kg)   SpO2 95%   BMI 29.54 kg/m   Physical Exam  Constitutional: She is oriented to person, place, and time. She appears well-developed and well-nourished.    Appears older than stated age  HENT:  Head: Normocephalic and atraumatic.  Right Ear: External ear normal.  Left Ear: External ear normal.  Nose: Nose normal.  Mouth/Throat: Oropharynx is clear and moist.  Edentulous  Eyes: Conjunctivae are normal. Pupils are equal, round, and reactive to light.  Neck: Normal range of motion. Neck supple. No thyromegaly present.  Cardiovascular: Normal rate, regular rhythm and normal heart sounds.  Pulmonary/Chest: Effort normal and breath sounds normal. No respiratory distress.  No wheeze  Abdominal: Soft. Bowel sounds are normal.  Musculoskeletal: Normal range of motion. She exhibits no edema.  Lymphadenopathy:    She has no cervical adenopathy.  Neurological: She is alert and oriented to person, place, and time.  Gait normal  Skin: Skin is warm and dry.  Psychiatric: She has a normal mood and affect. Her behavior is normal. Thought content normal.  Nursing note and vitals reviewed.   ASSESSMENT/PLAN:  1. Panlobular emphysema (HCC) Stable.  Compliant with inhalers  2. Environmental allergies Asymptomatic  3. Screening for breast cancer Ordered - MM Digital Screening; Future  4. Osteoporosis with pathological fracture with routine healing Will order Reclast infusion   Patient Instructions  You are doing well  No change in medicine  Walk every day that you are able  I have ordered a mammogram  See me in six months   Eustace MooreYvonne Sue Sankalp Ferrell, MD

## 2017-10-22 ENCOUNTER — Ambulatory Visit (HOSPITAL_COMMUNITY): Payer: Medicare Other

## 2017-10-23 ENCOUNTER — Other Ambulatory Visit: Payer: Self-pay | Admitting: Family Medicine

## 2017-10-23 ENCOUNTER — Telehealth: Payer: Self-pay

## 2017-10-23 DIAGNOSIS — Z853 Personal history of malignant neoplasm of breast: Secondary | ICD-10-CM

## 2017-10-29 ENCOUNTER — Ambulatory Visit (INDEPENDENT_AMBULATORY_CARE_PROVIDER_SITE_OTHER): Payer: Medicare Other

## 2017-10-29 VITALS — BP 120/76 | HR 84 | Resp 16 | Ht 62.0 in | Wt 162.0 lb

## 2017-10-29 DIAGNOSIS — Z0000 Encounter for general adult medical examination without abnormal findings: Secondary | ICD-10-CM

## 2017-10-29 NOTE — Patient Instructions (Signed)
Abigail Gill , Thank you for taking time to come for your Medicare Wellness Visit. I appreciate your ongoing commitment to your health goals. Please review the following plan we discussed and let me know if I can assist you in the future.   Screening recommendations/referrals: Colonoscopy: Due 05/28/2026 Mammogram: Due  Bone Density: N/A Recommended yearly ophthalmology/optometry visit for glaucoma screening and checkup Recommended yearly dental visit for hygiene and checkup  Vaccinations: Influenza vaccine: Complete Pneumococcal vaccine: Due Tdap vaccine: Due Shingles vaccine: Due   Advanced directives: Info given   Conditions/risks identified: Yes  Next appointment: Scheduled  Preventive Care 40-64 Years, Female Preventive care refers to lifestyle choices and visits with your health care provider that can promote health and wellness. What does preventive care include?  A yearly physical exam. This is also called an annual well check.  Dental exams once or twice a year.  Routine eye exams. Ask your health care provider how often you should have your eyes checked.  Personal lifestyle choices, including:  Daily care of your teeth and gums.  Regular physical activity.  Eating a healthy diet.  Avoiding tobacco and drug use.  Limiting alcohol use.  Practicing safe sex.  Taking low-dose aspirin daily starting at age 8.  Taking vitamin and mineral supplements as recommended by your health care provider. What happens during an annual well check? The services and screenings done by your health care provider during your annual well check will depend on your age, overall health, lifestyle risk factors, and family history of disease. Counseling  Your health care provider may ask you questions about your:  Alcohol use.  Tobacco use.  Drug use.  Emotional well-being.  Home and relationship well-being.  Sexual activity.  Eating habits.  Work and work  Statistician.  Method of birth control.  Menstrual cycle.  Pregnancy history. Screening  You may have the following tests or measurements:  Height, weight, and BMI.  Blood pressure.  Lipid and cholesterol levels. These may be checked every 5 years, or more frequently if you are over 56 years old.  Skin check.  Lung cancer screening. You may have this screening every year starting at age 9 if you have a 30-pack-year history of smoking and currently smoke or have quit within the past 15 years.  Fecal occult blood test (FOBT) of the stool. You may have this test every year starting at age 46.  Flexible sigmoidoscopy or colonoscopy. You may have a sigmoidoscopy every 5 years or a colonoscopy every 10 years starting at age 69.  Hepatitis C blood test.  Hepatitis B blood test.  Sexually transmitted disease (STD) testing.  Diabetes screening. This is done by checking your blood sugar (glucose) after you have not eaten for a while (fasting). You may have this done every 1-3 years.  Mammogram. This may be done every 1-2 years. Talk to your health care provider about when you should start having regular mammograms. This may depend on whether you have a family history of breast cancer.  BRCA-related cancer screening. This may be done if you have a family history of breast, ovarian, tubal, or peritoneal cancers.  Pelvic exam and Pap test. This may be done every 3 years starting at age 83. Starting at age 43, this may be done every 5 years if you have a Pap test in combination with an HPV test.  Bone density scan. This is done to screen for osteoporosis. You may have this scan if you are at high  risk for osteoporosis. Discuss your test results, treatment options, and if necessary, the need for more tests with your health care provider. Vaccines  Your health care provider may recommend certain vaccines, such as:  Influenza vaccine. This is recommended every year.  Tetanus, diphtheria,  and acellular pertussis (Tdap, Td) vaccine. You may need a Td booster every 10 years.  Zoster vaccine. You may need this after age 65.  Pneumococcal 13-valent conjugate (PCV13) vaccine. You may need this if you have certain conditions and were not previously vaccinated.  Pneumococcal polysaccharide (PPSV23) vaccine. You may need one or two doses if you smoke cigarettes or if you have certain conditions. Talk to your health care provider about which screenings and vaccines you need and how often you need them. This information is not intended to replace advice given to you by your health care provider. Make sure you discuss any questions you have with your health care provider. Document Released: 09/29/2015 Document Revised: 05/22/2016 Document Reviewed: 07/04/2015 Elsevier Interactive Patient Education  2017 [place] Prevention in the Home Falls can cause injuries. They can happen to people of all ages. There are many things you can do to make your home safe and to help prevent falls. What can I do on the outside of my home?  Regularly fix the edges of walkways and driveways and fix any cracks.  Remove anything that might make you trip as you walk through a door, such as a raised step or threshold.  Trim any bushes or trees on the path to your home.  Use bright outdoor lighting.  Clear any walking paths of anything that might make someone trip, such as rocks or tools.  Regularly check to see if handrails are loose or broken. Make sure that both sides of any steps have handrails.  Any raised decks and porches should have guardrails on the edges.  Have any leaves, snow, or ice cleared regularly.  Use sand or salt on walking paths during winter.  Clean up any spills in your garage right away. This includes oil or grease spills. What can I do in the bathroom?  Use night lights.  Install grab bars by the toilet and in the tub and shower. Do not use towel bars as grab  bars.  Use non-skid mats or decals in the tub or shower.  If you need to sit down in the shower, use a plastic, non-slip stool.  Keep the floor dry. Clean up any water that spills on the floor as soon as it happens.  Remove soap buildup in the tub or shower regularly.  Attach bath mats securely with double-sided non-slip rug tape.  Do not have throw rugs and other things on the floor that can make you trip. What can I do in the bedroom?  Use night lights.  Make sure that you have a light by your bed that is easy to reach.  Do not use any sheets or blankets that are too big for your bed. They should not hang down onto the floor.  Have a firm chair that has side arms. You can use this for support while you get dressed.  Do not have throw rugs and other things on the floor that can make you trip. What can I do in the kitchen?  Clean up any spills right away.  Avoid walking on wet floors.  Keep items that you use a lot in easy-to-reach places.  If you need to reach something  above you, use a strong step stool that has a grab bar.  Keep electrical cords out of the way.  Do not use floor polish or wax that makes floors slippery. If you must use wax, use non-skid floor wax.  Do not have throw rugs and other things on the floor that can make you trip. What can I do with my stairs?  Do not leave any items on the stairs.  Make sure that there are handrails on both sides of the stairs and use them. Fix handrails that are broken or loose. Make sure that handrails are as long as the stairways.  Check any carpeting to make sure that it is firmly attached to the stairs. Fix any carpet that is loose or worn.  Avoid having throw rugs at the top or bottom of the stairs. If you do have throw rugs, attach them to the floor with carpet tape.  Make sure that you have a light switch at the top of the stairs and the bottom of the stairs. If you do not have them, ask someone to add them for  you. What else can I do to help prevent falls?  Wear shoes that:  Do not have high heels.  Have rubber bottoms.  Are comfortable and fit you well.  Are closed at the toe. Do not wear sandals.  If you use a stepladder:  Make sure that it is fully opened. Do not climb a closed stepladder.  Make sure that both sides of the stepladder are locked into place.  Ask someone to hold it for you, if possible.  Clearly mark and make sure that you can see:  Any grab bars or handrails.  First and last steps.  Where the edge of each step is.  Use tools that help you move around (mobility aids) if they are needed. These include:  Canes.  Walkers.  Scooters.  Crutches.  Turn on the lights when you go into a dark area. Replace any light bulbs as soon as they burn out.  Set up your furniture so you have a clear path. Avoid moving your furniture around.  If any of your floors are uneven, fix them.  If there are any pets around you, be aware of where they are.  Review your medicines with your doctor. Some medicines can make you feel dizzy. This can increase your chance of falling. Ask your doctor what other things that you can do to help prevent falls. This information is not intended to replace advice given to you by your health care provider. Make sure you discuss any questions you have with your health care provider. Document Released: 06/29/2009 Document Revised: 02/08/2016 Document Reviewed: 10/07/2014 Elsevier Interactive Patient Education  2017 [employer].

## 2017-10-29 NOTE — Progress Notes (Signed)
Subjective:   Abigail Gill is a 57 y.o. female who presents for an Initial Medicare Annual Wellness Visit.  Review of Systems      Cardiac Risk Factors include: none     Objective:    Today's Vitals   10/29/17 1310  BP: 120/76  Pulse: 84  Resp: 16  SpO2: 92%  Weight: 162 lb (73.5 kg)  Height: 5\' 2"  (1.575 m)   Body mass index is 29.63 kg/m.  Advanced Directives 08/26/2011  Does Patient Have a Medical Advance Directive? Patient does not have advance directive;Patient would not like information  Pre-existing out of facility DNR order (yellow form or pink Gill form) No    Current Medications (verified) Outpatient Encounter Medications as of 10/29/2017  Medication Sig  . albuterol (PROVENTIL HFA;VENTOLIN HFA) 108 (90 BASE) MCG/ACT inhaler Inhale 2 puffs into the lungs every 4 (four) hours as needed. For shortness of breath   . cetirizine (ZYRTEC) 10 MG tablet Take 10 mg by mouth at bedtime.    . cholecalciferol (VITAMIN D) 1000 units tablet Take 1,000 Units by mouth daily.  . Fluticasone-Salmeterol (ADVAIR) 250-50 MCG/DOSE AEPB Inhale 1 puff into the lungs every 12 (twelve) hours.    . nabumetone (RELAFEN) 500 MG tablet Take 1 tablet (500 mg total) by mouth 2 (two) times daily.   No facility-administered encounter medications on file as of 10/29/2017.     Allergies (verified) Fosamax [alendronate sodium]   History: Past Medical History:  Diagnosis Date  . Asthma   . COPD (chronic obstructive pulmonary disease) (HCC)   . GERD (gastroesophageal reflux disease)   . Osteoporosis   . Shortness of breath    with exertion   Past Surgical History:  Procedure Laterality Date  . BREAST ENHANCEMENT SURGERY  1996   bilateral-[place]  . BREAST LUMPECTOMY  08/30/2011   Procedure: LUMPECTOMY;  Surgeon: Abigail Gill;  Location: [facility];  Service: General;  Laterality: Left;  . KYPHOPLASTY  2018  . TUBAL LIGATION  1985   Abigail HawkingAnnie Gill   Family History  Problem  Relation Age of Onset  . Alcohol abuse Mother   . Arthritis Mother   . COPD Mother   . Hyperlipidemia Mother   . Pancreatitis Mother   . Heart failure Father   . Alcohol abuse Father   . Arthritis Father   . Diabetes Father   . Heart disease Father   . Hyperlipidemia Father   . Hypertension Father   . Kidney disease Father   . Stroke Father   . COPD Sister   . Anesthesia problems Neg Hx   . Hypotension Neg Hx   . Malignant hyperthermia Neg Hx   . Pseudochol deficiency Neg Hx    Social History   Socioeconomic History  . Marital status: Married    Spouse name: Abigail Gill  . Number of children: 3  . Years of education: 911  . Highest education level: None  Social Needs  . Financial resource strain: None  . Food insecurity - worry: None  . Food insecurity - inability: None  . Transportation needs - medical: None  . Transportation needs - non-medical: None  Occupational History  . Occupation: disabled    Comment: COPD  Tobacco Use  . Smoking status: Former Smoker    Packs/day: 1.00    Years: 30.00    Pack years: 30.00    Types: Cigarettes    Start date: 09/16/1976    Last attempt to quit: 04/18/2009  Years since quitting: 8.5  . Smokeless tobacco: Never Used  Substance and Sexual Activity  . Alcohol use: No    Alcohol/week: 0.0 oz  . Drug use: No  . Sexual activity: Yes  Other Topics Concern  . None  Social History Narrative   Disabled due to COPD   Was a Associate Professor   Lives at home with Abigail Gill   2 chihuahuas    Tobacco Counseling Counseling given: Not Answered   Clinical Intake:  Pre-visit preparation completed: Yes  Pain : No/denies pain     Nutritional Status: BMI 25 -29 Overweight Diabetes: No  What is the last grade level you completed in school?: 9th, optained GED  Interpreter Needed?: No  Information entered by :: Everitt Amber LPN    Activities of Daily Living In your present state of health, do you have any difficulty performing the  following activities: 10/29/2017 07/07/2017  Hearing? N N  Vision? N N  Difficulty concentrating or making decisions? N N  Walking or climbing stairs? Y N  Dressing or bathing? N N  Doing errands, shopping? N N  Preparing Food and eating ? N -  Using the Toilet? N -  In the past six months, have you accidently leaked urine? N -  Do you have problems with loss of bowel control? N -  Managing your Medications? N -  Managing your Finances? N -  Housekeeping or managing your Housekeeping? N -  Some recent data might be hidden     Immunizations and Health Maintenance Immunization History  Administered Date(s) Administered  . Influenza-Unspecified 06/29/2017  . Pneumococcal Conjugate-13 07/07/2017   Health Maintenance Due  Topic Date Due  . Abigail Gill  11/09/1979    Patient Care Team: Abigail Moore, MD as PCP - General (Family Medicine)  Indicate any recent Medical Services you may have received from other than [facility] providers in the past year (date may be approximate).     Assessment:   This is a routine wellness examination for Abigail Gill.  Hearing/Vision screen No exam data present  Dietary issues and exercise activities discussed: Current Exercise Habits: Home exercise routine;The patient does not participate in regular exercise at present  Goals    . DIET - INCREASE WATER INTAKE    . Exercise 5 X per week (30 min per time)      Depression Screen PHQ 2/9 Scores 10/29/2017 10/16/2017 07/07/2017  PHQ - 2 Score 0 0 0    Fall Risk Fall Risk  10/29/2017 10/16/2017  Falls in the past year? No No    Is the patient's home free of loose throw rugs in walkways, pet beds, electrical cords, etc?   yes      Grab bars in the bathroom? yes      Handrails on the stairs?   yes      Adequate lighting?   yes  Timed Get Up and Go Performed   Cognitive Function:        Screening Tests Health Maintenance  Topic Date Due  . TETANUS/TDAP  [birth year]09/1977  . MAMMOGRAM   08/12/2018 (Originally 08/29/2013)  . Hepatitis C Screening  08/12/2018 (Originally 1961-08-18)  . HIV Screening  08/12/2018 (Originally 11/09/1973)  . PAP SMEAR  11/30/2018  . COLONOSCOPY  05/28/2026  . INFLUENZA VACCINE  Completed    Qualifies for Shingles Vaccine? yes  Cancer Screenings: Lung: Low Dose CT Chest recommended if Age 55-80 years, 30 pack-year currently smoking OR have quit w/in 15years. Patient does  not qualify. Breast: Up to date on Mammogram? Yes   Up to date of Bone Density/Dexa? Yes Colorectal: up to date   Additional Screenings:      Plan:     I have personally reviewed and noted the following in the patient's chart:   . Medical and social history . Use of alcohol, tobacco or illicit drugs  . Current medications and supplements . Functional ability and status . Nutritional status . Physical activity . Advanced directives . List of other physicians . Hospitalizations, surgeries, and ER visits in previous 12 months . Vitals . Screenings to include cognitive, depression, and falls . Referrals and appointments  In addition, I have reviewed and discussed with patient certain preventive protocols, quality metrics, and best practice recommendations. A written personalized care plan for preventive services as well as general preventive health recommendations were provided to patient.     Everitt Amber, LPN, LPN   1/61/0960

## 2017-11-04 ENCOUNTER — Encounter (HOSPITAL_COMMUNITY): Payer: Self-pay

## 2017-11-04 ENCOUNTER — Ambulatory Visit (HOSPITAL_COMMUNITY)
Admission: RE | Admit: 2017-11-04 | Discharge: 2017-11-04 | Disposition: A | Discharge status: Home / Self Care | Payer: Medicare Other | Source: Ambulatory Visit | Attending: Family Medicine | Admitting: Family Medicine

## 2017-11-04 DIAGNOSIS — R928 Other abnormal and inconclusive findings on diagnostic imaging of breast: Secondary | ICD-10-CM | POA: Diagnosis not present

## 2017-11-04 DIAGNOSIS — Z9882 Breast implant status: Secondary | ICD-10-CM | POA: Insufficient documentation

## 2017-11-04 DIAGNOSIS — Z1231 Encounter for screening mammogram for malignant neoplasm of breast: Secondary | ICD-10-CM | POA: Diagnosis not present

## 2017-11-04 DIAGNOSIS — Z1239 Encounter for other screening for malignant neoplasm of breast: Secondary | ICD-10-CM

## 2017-11-06 NOTE — Telephone Encounter (Signed)
Error

## 2017-11-24 ENCOUNTER — Encounter: Payer: Self-pay | Admitting: Family Medicine

## 2017-12-26 ENCOUNTER — Emergency Department (HOSPITAL_COMMUNITY): Payer: Medicare Other

## 2017-12-26 ENCOUNTER — Emergency Department (HOSPITAL_COMMUNITY)
Admission: EM | Admit: 2017-12-26 | Discharge: 2017-12-26 | Disposition: A | Discharge status: Home / Self Care | Payer: Medicare Other | Attending: Emergency Medicine | Admitting: Emergency Medicine

## 2017-12-26 ENCOUNTER — Encounter (HOSPITAL_COMMUNITY): Payer: Self-pay

## 2017-12-26 DIAGNOSIS — Z79899 Other long term (current) drug therapy: Secondary | ICD-10-CM | POA: Insufficient documentation

## 2017-12-26 DIAGNOSIS — J45909 Unspecified asthma, uncomplicated: Secondary | ICD-10-CM | POA: Diagnosis not present

## 2017-12-26 DIAGNOSIS — R079 Chest pain, unspecified: Secondary | ICD-10-CM | POA: Diagnosis not present

## 2017-12-26 DIAGNOSIS — R0602 Shortness of breath: Secondary | ICD-10-CM | POA: Diagnosis not present

## 2017-12-26 DIAGNOSIS — R7989 Other specified abnormal findings of blood chemistry: Secondary | ICD-10-CM | POA: Diagnosis not present

## 2017-12-26 DIAGNOSIS — Z87891 Personal history of nicotine dependence: Secondary | ICD-10-CM | POA: Insufficient documentation

## 2017-12-26 DIAGNOSIS — R0789 Other chest pain: Secondary | ICD-10-CM | POA: Diagnosis not present

## 2017-12-26 LAB — BASIC METABOLIC PANEL
Anion gap: 11 (ref 5–15)
BUN: 15 mg/dL (ref 6–20)
CO2: 23 mmol/L (ref 22–32)
Calcium: 8.9 mg/dL (ref 8.9–10.3)
Chloride: 104 mmol/L (ref 101–111)
Creatinine, Ser: 0.81 mg/dL (ref 0.44–1.00)
GFR calc Af Amer: >60 mL/min (ref >60)
GFR calc non Af Amer: >60 mL/min (ref >60)
Glucose, Bld: 115 mg/dL — ABNORMAL HIGH (ref 65–99)
Potassium: 4 mmol/L (ref 3.5–5.1)
Sodium: 138 mmol/L (ref 135–145)

## 2017-12-26 LAB — CBC WITH DIFFERENTIAL/PLATELET
Basophils Absolute: 0 10*3/uL (ref 0.0–0.1)
Basophils Relative: 0 %
Eosinophils Absolute: 0.1 10*3/uL (ref 0.0–0.7)
Eosinophils Relative: 1 %
HCT: 41.8 % (ref 36.0–46.0)
Hemoglobin: 13.5 g/dL (ref 12.0–15.0)
Lymphocytes Relative: 19 %
Lymphs Abs: 1.6 10*3/uL (ref 0.7–4.0)
MCH: 28.6 pg (ref 26.0–34.0)
MCHC: 32.3 g/dL (ref 30.0–36.0)
MCV: 88.6 fL (ref 78.0–100.0)
Monocytes Absolute: 0.6 10*3/uL (ref 0.1–1.0)
Monocytes Relative: 8 %
Neutro Abs: 5.7 10*3/uL (ref 1.7–7.7)
Neutrophils Relative %: 72 %
Platelets: 245 10*3/uL (ref 150–400)
RBC: 4.72 MIL/uL (ref 3.87–5.11)
RDW: 13.1 % (ref 11.5–15.5)
WBC: 8 10*3/uL (ref 4.0–10.5)

## 2017-12-26 LAB — D-DIMER, QUANTITATIVE: D-Dimer, Quant: 1.12 ug/mL-FEU — ABNORMAL HIGH (ref 0.00–0.50)

## 2017-12-26 MED ORDER — IBUPROFEN 800 MG PO TABS: 800.0000 mg | ORAL_TABLET | Freq: Three times a day (TID) | ORAL | 0 refills | Status: DC

## 2017-12-26 MED ORDER — ONDANSETRON HCL 4 MG/2ML IJ SOLN
4.0000 mg | Freq: Once | INTRAMUSCULAR | Status: AC
  Administered 2017-12-26: 4 mg via INTRAVENOUS
  Filled 2017-12-26: qty 2

## 2017-12-26 MED ORDER — TRAMADOL HCL 50 MG PO TABS: 50.0000 mg | ORAL_TABLET | Freq: Four times a day (QID) | ORAL | 0 refills | Status: DC | PRN

## 2017-12-26 MED ORDER — PREDNISONE 20 MG PO TABS: 40.0000 mg | ORAL_TABLET | Freq: Every day | ORAL | 0 refills | Status: DC

## 2017-12-26 MED ORDER — IOPAMIDOL (ISOVUE-370) INJECTION 76%
100.0000 mL | Freq: Once | INTRAVENOUS | Status: AC | PRN
  Administered 2017-12-26: 100 mL via INTRAVENOUS

## 2017-12-26 MED ORDER — SODIUM CHLORIDE 0.9 % IV BOLUS
500.0000 mL | Freq: Once | INTRAVENOUS | Status: AC
  Administered 2017-12-26: 500 mL via INTRAVENOUS

## 2017-12-26 MED ORDER — MORPHINE SULFATE (PF) 4 MG/ML IV SOLN
4.0000 mg | Freq: Once | INTRAVENOUS | Status: AC
  Administered 2017-12-26: 4 mg via INTRAVENOUS
  Filled 2017-12-26: qty 1

## 2017-12-26 NOTE — ED Triage Notes (Signed)
Mid sternal chest pain onset last night, worse with breathing, increased sob.  Pt states it hurts to take a deep breath.

## 2017-12-26 NOTE — ED Provider Notes (Signed)
[facility] EMERGENCY DEPARTMENT Provider Note   CSN: [id number] Arrival date & time: 12/26/17  0448     History   Chief Complaint Chief Complaint  Patient presents with  . Chest Pain    HPI Abigail Gill is a 57 y.o. female.  Patient presents to the ER for evaluation of chest pain.  Symptoms began yesterday.  She reports that she has a sharp pain in the center of her chest.  The pain is worsened by movement such as bending and twisting.  The area is tender to the touch.  He worsens when she takes a deep breath.  She does feel short of breath, but reports that she "always feels short of breath".  She does not have a cough or fever.  She has not had any recent falls or injury.     Past Medical History:  Diagnosis Date  . Asthma   . COPD (chronic obstructive pulmonary disease) (HCC)   . GERD (gastroesophageal reflux disease)   . Osteoporosis   . Shortness of breath    with exertion    Patient Active Problem List   Diagnosis Date Noted  . Chronic back pain 08/12/2017  . Vitamin D deficiency 08/12/2017  . COPD with emphysema (HCC) 07/07/2017  . Former cigarette smoker 07/07/2017  . Osteoporosis with pathological fracture with routine healing 07/07/2017  . Environmental allergies 07/07/2017    Past Surgical History:  Procedure Laterality Date  . BREAST BIOPSY Left    benign  . BREAST ENHANCEMENT SURGERY  1996   bilateral-[place]  . BREAST EXCISIONAL BIOPSY Left    benign  . BREAST LUMPECTOMY  08/30/2011   Procedure: LUMPECTOMY;  Surgeon: Fabio Bering;  Location: [facility];  Service: General;  Laterality: Left;  . KYPHOPLASTY  2018  . TUBAL LIGATION  1985   [place]     OB History   None      Home Medications    Prior to Admission medications   Medication Sig Start Date End Date Taking? Authorizing Provider  albuterol (PROVENTIL HFA;VENTOLIN HFA) 108 (90 BASE) MCG/ACT inhaler Inhale 2 puffs into the lungs every 4 (four) hours as needed. For  shortness of breath     [provider]  cetirizine (ZYRTEC) 10 MG tablet Take 10 mg by mouth at bedtime.      [provider]  cholecalciferol (VITAMIN D) 1000 units tablet Take 1,000 Units by mouth daily.    [provider]  Fluticasone-Salmeterol (ADVAIR) 250-50 MCG/DOSE AEPB Inhale 1 puff into the lungs every 12 (twelve) hours.      [provider]  ibuprofen (ADVIL,MOTRIN) 800 MG tablet Take 1 tablet (800 mg total) by mouth 3 (three) times daily. 12/26/17   Gilda Crease, MD  nabumetone (RELAFEN) 500 MG tablet Take 1 tablet (500 mg total) by mouth 2 (two) times daily. 08/12/17   Eustace Moore, MD  traMADol (ULTRAM) 50 MG tablet Take 1 tablet (50 mg total) by mouth every 6 (six) hours as needed. 12/26/17   Gilda Crease, MD    Family History Family History  Problem Relation Age of Onset  . Alcohol abuse Mother   . Arthritis Mother   . COPD Mother   . Hyperlipidemia Mother   . Pancreatitis Mother   . Heart failure Father   . Alcohol abuse Father   . Arthritis Father   . Diabetes Father   . Heart disease Father   . Hyperlipidemia Father   .  Hypertension Father   . Kidney disease Father   . Stroke Father   . COPD Sister   . Anesthesia problems Neg Hx   . Hypotension Neg Hx   . Malignant hyperthermia Neg Hx   . Pseudochol deficiency Neg Hx     Social History Social History   Tobacco Use  . Smoking status: Former Smoker    Packs/day: 1.00    Years: 30.00    Pack years: 30.00    Types: Cigarettes    Start date: 09/16/1976    Last attempt to quit: 04/18/2009    Years since quitting: 8.6  . Smokeless tobacco: Never Used  Substance Use Topics  . Alcohol use: No    Alcohol/week: 0.0 oz  . Drug use: No     Allergies   Fosamax [alendronate sodium]   Review of Systems Review of Systems  Respiratory: Positive for shortness of breath.   Cardiovascular: Positive for chest pain.  All other systems reviewed and are  negative.    Physical Exam Updated Vital Signs BP 117/74   Pulse 93   Resp (!) 21   SpO2 92%   Physical Exam  Constitutional: She is oriented to person, place, and time. She appears well-developed and well-nourished. No distress.  HENT:  Head: Normocephalic and atraumatic.  Right Ear: Hearing normal.  Left Ear: Hearing normal.  Nose: Nose normal.  Mouth/Throat: Oropharynx is clear and moist and mucous membranes are normal.  Eyes: Pupils are equal, round, and reactive to light. Conjunctivae and EOM are normal.  Neck: Normal range of motion. Neck supple.  Cardiovascular: Regular rhythm, S1 normal and S2 normal. Tachycardia present. Exam reveals no gallop and no friction rub.  No murmur heard. Pulmonary/Chest: Effort normal and breath sounds normal. No respiratory distress. She exhibits no tenderness.    Abdominal: Soft. Normal appearance and bowel sounds are normal. There is no hepatosplenomegaly. There is no tenderness. There is no rebound, no guarding, no tenderness at McBurney's point and negative Murphy's sign. No hernia.  Musculoskeletal: Normal range of motion.  Neurological: She is alert and oriented to person, place, and time. She has normal strength. No cranial nerve deficit or sensory deficit. Coordination normal. GCS eye subscore is 4. GCS verbal subscore is 5. GCS motor subscore is 6.  Skin: Skin is warm, dry and intact. No rash noted. No cyanosis.  Psychiatric: Her speech is normal and behavior is normal. Thought content normal. Her mood appears anxious.  Nursing note and vitals reviewed.    ED Treatments / Results  Labs (all labs ordered are listed, but only abnormal results are displayed) Labs Reviewed  BASIC METABOLIC PANEL - Abnormal; Notable for the following components:      Result Value   Glucose, Bld 115 (*)    All other components within normal limits  D-DIMER, QUANTITATIVE (NOT AT [facility]) - Abnormal; Notable for the following components:   D-Dimer, Quant  1.12 (*)    All other components within normal limits  CBC WITH DIFFERENTIAL/PLATELET    EKG EKG Interpretation  Date/Time:  Friday December 26 2017 04:57:04 EDT Ventricular Rate:  112 PR Interval:    QRS Duration: 89 QT Interval:  322 QTC Calculation: 440 R Axis:   78 Text Interpretation:  Sinus tachycardia Borderline T abnormalities, anterior leads Baseline wander in lead(s) V2 No previous tracing Confirmed by Gilda Crease [id number]) on 12/26/2017 5:01:29 AM   Radiology Dg Chest 2 View  Result Date: 12/26/2017 CLINICAL DATA:  Midsternal chest pain.  EXAM: CHEST - 2 VIEW COMPARISON:  None. FINDINGS: The cardiomediastinal contours are normal. Slight coarsened interstitial markings. Minimal bibasilar atelectasis. Mild hyperinflation. Pulmonary vasculature is normal. No consolidation, pleural effusion, or pneumothorax. No acute osseous abnormalities are seen. IMPRESSION: Mild hyperinflation with coarse interstitial markings suggesting asthma or bronchitis. Streaky bibasilar atelectasis. Electronically Signed   By: Rubye OaksMelanie  Ehinger M.D.   On: 12/26/2017 05:54    Procedures Procedures (including critical care time)  Medications Ordered in ED Medications  sodium chloride 0.9 % bolus 500 mL (0 mLs Intravenous Stopped 12/26/17 0610)  morphine 4 MG/ML injection 4 mg (4 mg Intravenous Given 12/26/17 0508)  ondansetron (ZOFRAN) injection 4 mg (4 mg Intravenous Given 12/26/17 0508)  iopamidol (ISOVUE-370) 76 % injection 100 mL (100 mLs Intravenous Contrast Given 12/26/17 [id number])     Initial Impression / Assessment and Plan / ED Course  I have reviewed the triage vital signs and the nursing notes.  Pertinent labs & imaging results that were available during my care of the patient were reviewed by me and considered in my medical decision making (see chart for details).     Patient presents to the ER for evaluation of chest pain.  She has associated shortness of breath.  Patient has a  history of COPD, no known coronary disease.  Pain is very atypical.  Pain started at rest and it is reproducible with movements and palpation.  She was tachycardic at arrival, but I believe she is extremely anxious.  Her husband died a month ago from a heart attack, causing her to feel severe anxiety about having chest pain.  Cardiac evaluation negative.  D-dimer was elevated so she underwent CT angiography.  No evidence of PE or other acute pathology.  Patient will be treated for musculoskeletal chest pain, follow-up with PCP.  Final Clinical Impressions(s) / ED Diagnoses   Final diagnoses:  Chest wall pain    ED Discharge Orders        Ordered    ibuprofen (ADVIL,MOTRIN) 800 MG tablet  3 times daily     12/26/17 0656    traMADol (ULTRAM) 50 MG tablet  Every 6 hours PRN     12/26/17 0656       Gilda CreasePollina, Anwyn Kriegel J, MD 12/26/17 [phone]

## 2017-12-26 NOTE — ED Notes (Signed)
Patient transported to CT 

## 2018-01-01 ENCOUNTER — Ambulatory Visit: Payer: Medicare Other | Admitting: Family Medicine

## 2018-01-01 ENCOUNTER — Encounter: Payer: Self-pay | Admitting: Family Medicine

## 2018-01-01 ENCOUNTER — Other Ambulatory Visit: Payer: Self-pay

## 2018-01-01 VITALS — BP 118/72 | HR 74 | Temp 98.0°F | Resp 16 | Ht 62.0 in | Wt 163.0 lb

## 2018-01-01 DIAGNOSIS — J340 Abscess, furuncle and carbuncle of nose: Secondary | ICD-10-CM | POA: Diagnosis not present

## 2018-01-01 MED ORDER — HYDROCODONE-ACETAMINOPHEN 7.5-325 MG PO TABS: 1.0000 | ORAL_TABLET | ORAL | 0 refills | Status: DC | PRN

## 2018-01-01 MED ORDER — CEFADROXIL 500 MG PO CAPS: 500.0000 mg | ORAL_CAPSULE | Freq: Two times a day (BID) | ORAL | 1 refills | Status: DC

## 2018-01-01 NOTE — Patient Instructions (Signed)
Take the antibiotic 2 x a day Take the pain medicine as needed Caution drowsiness and constipation Call if not improving in 2-3 days GO TO ER if it gets worse or you run a fever

## 2018-01-01 NOTE — Progress Notes (Signed)
Chief Complaint  Patient presents with  . Facial Swelling    started Monday  . Facial Swelling    eye  . Nasal Congestion   Patient is here for an acute visit.  She has had a hard time lately.  She went to an urgent care center for shortness of breath.  She had a chest x-ray, chest CT, lab work, and EKG.  She was given prednisone.  Her breathing got better.  Then over the weekend she started having more sinus congestion and drainage.  On Monday she noticed she had some sores around the opening of the left nostril.  This fairly rapidly spread to redness pain and swelling of her nose.  It was very painful.  She ran a low-grade fever.  She called for an appointment yesterday and was given an appointment today.  Apparently she did not impressed to the office staff the acuity of her symptoms.  She was made appointment for "sinus".  She has never had this problem before.  She can breathe through her nose.  She does not have any purulent drainage.  Her breathing is better.  She did not sleep last night because of the nose pain.1  Patient Active Problem List   Diagnosis Date Noted  . Chronic back pain 08/12/2017  . Vitamin D deficiency 08/12/2017  . COPD with emphysema (HCC) 07/07/2017  . Former cigarette smoker 07/07/2017  . Osteoporosis with pathological fracture with routine healing 07/07/2017  . Environmental allergies 07/07/2017    Outpatient Encounter Medications as of 01/01/2018  Medication Sig  . albuterol (PROVENTIL HFA;VENTOLIN HFA) 108 (90 BASE) MCG/ACT inhaler Inhale 2 puffs into the lungs every 4 (four) hours as needed. For shortness of breath   . cetirizine (ZYRTEC) 10 MG tablet Take 10 mg by mouth at bedtime.    . cholecalciferol (VITAMIN D) 1000 units tablet Take 1,000 Units by mouth daily.  . Fluticasone-Salmeterol (ADVAIR) 250-50 MCG/DOSE AEPB Inhale 1 puff into the lungs every 12 (twelve) hours.    . cefadroxil (DURICEF) 500 MG capsule Take 1 capsule (500 mg total) by mouth  2 (two) times daily.  Marland Kitchen. HYDROcodone-acetaminophen (NORCO) 7.5-325 MG tablet Take 1 tablet by mouth every 4 (four) hours as needed for moderate pain.  Marland Kitchen. ibuprofen (ADVIL,MOTRIN) 800 MG tablet Take 1 tablet (800 mg total) by mouth 3 (three) times daily. (Patient not taking: Reported on 01/01/2018)  . nabumetone (RELAFEN) 500 MG tablet Take 1 tablet (500 mg total) by mouth 2 (two) times daily. (Patient not taking: Reported on 01/01/2018)  . predniSONE (DELTASONE) 20 MG tablet Take 2 tablets (40 mg total) by mouth daily with breakfast. (Patient not taking: Reported on 01/01/2018)   No facility-administered encounter medications on file as of 01/01/2018.     Allergies  Allergen Reactions  . Fosamax [Alendronate Sodium] Nausea And Vomiting    Vomiting within minutes of taking    Review of Systems  Constitutional: Negative for activity change, appetite change and fever.  HENT: Positive for congestion, facial swelling, postnasal drip and rhinorrhea. Negative for sore throat and trouble swallowing.   Eyes: Negative for redness and visual disturbance.  Respiratory: Negative for cough and shortness of breath.        Shortness of breath has resolved  Cardiovascular: Negative for chest pain and palpitations.  Gastrointestinal: Negative for constipation, diarrhea and nausea.  Musculoskeletal: Negative for arthralgias and myalgias.  Neurological: Negative for dizziness and headaches.  Psychiatric/Behavioral: Positive for sleep disturbance.  Physical Exam  Constitutional: She is oriented to person, place, and time. She appears well-developed and well-nourished. She appears distressed.  Mildly ill.  Uncomfortable  HENT:  Head: Normocephalic and atraumatic.  Right Ear: External ear normal.  Left Ear: External ear normal.  Nose:    Mouth/Throat: Oropharynx is clear and moist. No oropharyngeal exudate.  Eyes: Pupils are equal, round, and reactive to light. Conjunctivae are normal.  Neck: Normal  range of motion.  Cardiovascular: Normal rate, regular rhythm and normal heart sounds.  Pulmonary/Chest: Effort normal and breath sounds normal. She has no wheezes. She has no rales.  Musculoskeletal: Normal range of motion. She exhibits no edema.  Lymphadenopathy:    She has cervical adenopathy.  Neurological: She is alert and oriented to person, place, and time.  Psychiatric: She has a normal mood and affect. Her behavior is normal. Thought content normal.    BP 118/72   Pulse 74   Temp 98 F (36.7 C) (Oral)   Resp 16   Ht 5\' 2"  (1.575 m)   Wt 163 lb (73.9 kg)   SpO2 93%   BMI 29.81 kg/m     ASSESSMENT/PLAN:  1. Cellulitis of external nose Discussed this is painful swelling is an infection.  If it would ever recur, market where she needs to go to the emergency room.  Face cellulitis is potentially dangerous and very painful.  At this point she is able to take p.o. antibiotics.  I am going to give her Duricef 500 mg twice daily.  Take 2 doses today.  I am giving her hydrocodone as needed severe pain.  10 pills only.  She will rest and take the medicines.  Take with food.  Call for any problems.   Patient Instructions  Take the antibiotic 2 x a day Take the pain medicine as needed Caution drowsiness and constipation Call if not improving in 2-3 days GO TO ER if it gets worse or you run a fever   Eustace Moore, MD

## 2018-02-05 DIAGNOSIS — E782 Mixed hyperlipidemia: Secondary | ICD-10-CM | POA: Diagnosis not present

## 2018-02-05 DIAGNOSIS — E559 Vitamin D deficiency, unspecified: Secondary | ICD-10-CM | POA: Diagnosis not present

## 2018-02-05 DIAGNOSIS — R7302 Impaired glucose tolerance (oral): Secondary | ICD-10-CM | POA: Diagnosis not present

## 2018-02-05 DIAGNOSIS — R0600 Dyspnea, unspecified: Secondary | ICD-10-CM | POA: Diagnosis not present

## 2018-02-05 DIAGNOSIS — E781 Pure hyperglyceridemia: Secondary | ICD-10-CM | POA: Diagnosis not present

## 2018-02-05 DIAGNOSIS — M810 Age-related osteoporosis without current pathological fracture: Secondary | ICD-10-CM | POA: Diagnosis not present

## 2018-02-24 ENCOUNTER — Other Ambulatory Visit (HOSPITAL_COMMUNITY): Payer: Self-pay | Admitting: Internal Medicine

## 2018-02-24 ENCOUNTER — Ambulatory Visit (HOSPITAL_COMMUNITY)
Admission: RE | Admit: 2018-02-24 | Discharge: 2018-02-24 | Disposition: A | Discharge status: Home / Self Care | Payer: Medicare Other | Source: Ambulatory Visit | Attending: Internal Medicine | Admitting: Internal Medicine

## 2018-02-24 DIAGNOSIS — J439 Emphysema, unspecified: Secondary | ICD-10-CM | POA: Insufficient documentation

## 2018-02-24 DIAGNOSIS — R079 Chest pain, unspecified: Secondary | ICD-10-CM | POA: Diagnosis not present

## 2018-02-24 DIAGNOSIS — I700 Atherosclerosis of aorta: Secondary | ICD-10-CM | POA: Diagnosis not present

## 2018-02-24 DIAGNOSIS — J984 Other disorders of lung: Secondary | ICD-10-CM | POA: Diagnosis not present

## 2018-02-24 DIAGNOSIS — R0789 Other chest pain: Secondary | ICD-10-CM

## 2018-03-03 DIAGNOSIS — E559 Vitamin D deficiency, unspecified: Secondary | ICD-10-CM | POA: Diagnosis not present

## 2018-03-03 DIAGNOSIS — M810 Age-related osteoporosis without current pathological fracture: Secondary | ICD-10-CM | POA: Diagnosis not present

## 2018-03-03 DIAGNOSIS — R7302 Impaired glucose tolerance (oral): Secondary | ICD-10-CM | POA: Diagnosis not present

## 2018-04-15 ENCOUNTER — Ambulatory Visit: Payer: Medicare Other | Admitting: Family Medicine

## 2018-06-03 DIAGNOSIS — R7302 Impaired glucose tolerance (oral): Secondary | ICD-10-CM | POA: Diagnosis not present

## 2018-06-03 DIAGNOSIS — Z23 Encounter for immunization: Secondary | ICD-10-CM | POA: Diagnosis not present

## 2018-06-03 DIAGNOSIS — E559 Vitamin D deficiency, unspecified: Secondary | ICD-10-CM | POA: Diagnosis not present

## 2018-06-03 DIAGNOSIS — R911 Solitary pulmonary nodule: Secondary | ICD-10-CM | POA: Diagnosis not present

## 2018-06-04 ENCOUNTER — Other Ambulatory Visit (HOSPITAL_COMMUNITY): Payer: Self-pay | Admitting: Internal Medicine

## 2018-06-04 DIAGNOSIS — R911 Solitary pulmonary nodule: Secondary | ICD-10-CM

## 2018-07-01 ENCOUNTER — Ambulatory Visit (HOSPITAL_COMMUNITY)
Admission: RE | Admit: 2018-07-01 | Discharge: 2018-07-01 | Disposition: A | Discharge status: Home / Self Care | Payer: Medicare Other | Source: Ambulatory Visit | Attending: Internal Medicine | Admitting: Internal Medicine

## 2018-07-01 DIAGNOSIS — I700 Atherosclerosis of aorta: Secondary | ICD-10-CM | POA: Diagnosis not present

## 2018-07-01 DIAGNOSIS — J439 Emphysema, unspecified: Secondary | ICD-10-CM | POA: Diagnosis not present

## 2018-07-01 DIAGNOSIS — R911 Solitary pulmonary nodule: Secondary | ICD-10-CM | POA: Diagnosis not present

## 2018-08-12 DIAGNOSIS — J449 Chronic obstructive pulmonary disease, unspecified: Secondary | ICD-10-CM | POA: Diagnosis not present

## 2018-08-12 DIAGNOSIS — Z139 Encounter for screening, unspecified: Secondary | ICD-10-CM | POA: Diagnosis not present

## 2018-08-12 DIAGNOSIS — Z7189 Other specified counseling: Secondary | ICD-10-CM | POA: Diagnosis not present

## 2018-08-12 DIAGNOSIS — R911 Solitary pulmonary nodule: Secondary | ICD-10-CM | POA: Diagnosis not present

## 2018-08-12 DIAGNOSIS — Z1159 Encounter for screening for other viral diseases: Secondary | ICD-10-CM | POA: Diagnosis not present

## 2018-08-18 DIAGNOSIS — J9610 Chronic respiratory failure, unspecified whether with hypoxia or hypercapnia: Secondary | ICD-10-CM | POA: Diagnosis not present

## 2018-08-18 DIAGNOSIS — J449 Chronic obstructive pulmonary disease, unspecified: Secondary | ICD-10-CM | POA: Diagnosis not present

## 2018-09-03 DIAGNOSIS — R109 Unspecified abdominal pain: Secondary | ICD-10-CM | POA: Diagnosis not present

## 2018-09-03 DIAGNOSIS — J9611 Chronic respiratory failure with hypoxia: Secondary | ICD-10-CM | POA: Diagnosis not present

## 2018-09-03 DIAGNOSIS — E559 Vitamin D deficiency, unspecified: Secondary | ICD-10-CM | POA: Diagnosis not present

## 2018-09-03 DIAGNOSIS — J449 Chronic obstructive pulmonary disease, unspecified: Secondary | ICD-10-CM | POA: Diagnosis not present

## 2018-09-18 DIAGNOSIS — J449 Chronic obstructive pulmonary disease, unspecified: Secondary | ICD-10-CM | POA: Diagnosis not present

## 2018-09-18 DIAGNOSIS — J9610 Chronic respiratory failure, unspecified whether with hypoxia or hypercapnia: Secondary | ICD-10-CM | POA: Diagnosis not present

## 2018-09-23 ENCOUNTER — Other Ambulatory Visit (HOSPITAL_COMMUNITY): Payer: Self-pay | Admitting: Nurse Practitioner

## 2018-09-23 DIAGNOSIS — Z1231 Encounter for screening mammogram for malignant neoplasm of breast: Secondary | ICD-10-CM

## 2018-10-19 DIAGNOSIS — J9610 Chronic respiratory failure, unspecified whether with hypoxia or hypercapnia: Secondary | ICD-10-CM | POA: Diagnosis not present

## 2018-10-19 DIAGNOSIS — J449 Chronic obstructive pulmonary disease, unspecified: Secondary | ICD-10-CM | POA: Diagnosis not present

## 2018-11-06 ENCOUNTER — Ambulatory Visit (HOSPITAL_COMMUNITY): Payer: Medicare Other

## 2018-11-11 ENCOUNTER — Ambulatory Visit (HOSPITAL_COMMUNITY)
Admission: RE | Admit: 2018-11-11 | Discharge: 2018-11-11 | Disposition: A | Discharge status: Home / Self Care | Payer: Medicare Other | Source: Ambulatory Visit | Attending: Nurse Practitioner | Admitting: Nurse Practitioner

## 2018-11-11 DIAGNOSIS — Z1231 Encounter for screening mammogram for malignant neoplasm of breast: Secondary | ICD-10-CM | POA: Insufficient documentation

## 2018-11-17 DIAGNOSIS — J449 Chronic obstructive pulmonary disease, unspecified: Secondary | ICD-10-CM | POA: Diagnosis not present

## 2018-11-17 DIAGNOSIS — J9610 Chronic respiratory failure, unspecified whether with hypoxia or hypercapnia: Secondary | ICD-10-CM | POA: Diagnosis not present

## 2018-12-04 ENCOUNTER — Encounter (INDEPENDENT_AMBULATORY_CARE_PROVIDER_SITE_OTHER): Payer: Self-pay | Admitting: Nurse Practitioner

## 2019-03-01 ENCOUNTER — Ambulatory Visit (INDEPENDENT_AMBULATORY_CARE_PROVIDER_SITE_OTHER): Payer: Medicare Other | Admitting: Nurse Practitioner

## 2019-05-25 ENCOUNTER — Ambulatory Visit (INDEPENDENT_AMBULATORY_CARE_PROVIDER_SITE_OTHER): Payer: Medicare Other | Admitting: Internal Medicine

## 2019-07-14 ENCOUNTER — Encounter (INDEPENDENT_AMBULATORY_CARE_PROVIDER_SITE_OTHER): Payer: Self-pay | Admitting: Nurse Practitioner

## 2019-07-14 ENCOUNTER — Ambulatory Visit (HOSPITAL_COMMUNITY)
Admission: RE | Admit: 2019-07-14 | Discharge: 2019-07-14 | Disposition: A | Discharge status: Home / Self Care | Payer: Medicare Other | Source: Ambulatory Visit | Attending: Nurse Practitioner | Admitting: Nurse Practitioner

## 2019-07-14 ENCOUNTER — Other Ambulatory Visit: Payer: Self-pay

## 2019-07-14 ENCOUNTER — Other Ambulatory Visit (INDEPENDENT_AMBULATORY_CARE_PROVIDER_SITE_OTHER): Payer: Self-pay | Admitting: Nurse Practitioner

## 2019-07-14 ENCOUNTER — Other Ambulatory Visit (HOSPITAL_COMMUNITY): Payer: Self-pay | Admitting: Nurse Practitioner

## 2019-07-14 ENCOUNTER — Ambulatory Visit (INDEPENDENT_AMBULATORY_CARE_PROVIDER_SITE_OTHER): Payer: Medicare Other | Admitting: Nurse Practitioner

## 2019-07-14 VITALS — BP 112/68 | HR 99 | Resp 20 | Ht 62.0 in | Wt 163.8 lb

## 2019-07-14 DIAGNOSIS — R079 Chest pain, unspecified: Secondary | ICD-10-CM | POA: Insufficient documentation

## 2019-07-14 DIAGNOSIS — J431 Panlobular emphysema: Secondary | ICD-10-CM

## 2019-07-14 DIAGNOSIS — E559 Vitamin D deficiency, unspecified: Secondary | ICD-10-CM

## 2019-07-14 DIAGNOSIS — J9610 Chronic respiratory failure, unspecified whether with hypoxia or hypercapnia: Secondary | ICD-10-CM | POA: Diagnosis not present

## 2019-07-14 DIAGNOSIS — M5441 Lumbago with sciatica, right side: Secondary | ICD-10-CM

## 2019-07-14 DIAGNOSIS — M810 Age-related osteoporosis without current pathological fracture: Secondary | ICD-10-CM

## 2019-07-14 DIAGNOSIS — R252 Cramp and spasm: Secondary | ICD-10-CM | POA: Diagnosis not present

## 2019-07-14 DIAGNOSIS — M545 Low back pain, unspecified: Secondary | ICD-10-CM | POA: Insufficient documentation

## 2019-07-14 DIAGNOSIS — J9621 Acute and chronic respiratory failure with hypoxia: Secondary | ICD-10-CM | POA: Insufficient documentation

## 2019-07-14 DIAGNOSIS — R918 Other nonspecific abnormal finding of lung field: Secondary | ICD-10-CM

## 2019-07-14 DIAGNOSIS — J449 Chronic obstructive pulmonary disease, unspecified: Secondary | ICD-10-CM | POA: Diagnosis not present

## 2019-07-14 MED ORDER — IBUPROFEN 800 MG PO TABS: 800.0000 mg | ORAL_TABLET | Freq: Three times a day (TID) | ORAL | 0 refills | Status: DC | PRN

## 2019-07-14 NOTE — Patient Instructions (Addendum)
Thank you for choosing [facility] as your medical provider! If you have any questions or concerns regarding your health care, please do not hesitate to call our office.  Please follow-up as scheduled. We look forward to seeing you again soon! Have a great Halloween!!  We discussed the following today: 1.  Back pain: Please report to [facility] radiology for back x-rays.  As we discussed cycle between Tylenol and ibuprofen for pain relief.  Further recommendations will be made once we get the imaging results.  Also I would recommend calling your back surgeon to try to get an appointment as soon as possible. 2.  Chest pain: Your EKG today appeared to be normal.  However, I am going to refer you to cardiology for further evaluation.  If you do not hear back from this office by the cardiologist office within 2 weeks about scheduling this please call our office. 3.  Oxygen dependent respiratory disease: I will send in your oxygen recertification to your home health agency.  Keep using oxygen as prescribed 4.  Vitamin D deficiency: Make sure to check your bottle at home.  If the dose is 50,000, take 1 tablet/week.  If the dose is 5000, you can take 2 tablets by mouth daily. 5.  Osteoporosis: Continue to try and avoid falling by having adequate lighting in your home, getting rid of any throw rugs, using grab bars in your bathroom. 6.  Lung nodules: If you do not hear from the radiology department for this to be scheduled in January please call our office and we can reorder this for you.  At [facility] we value your feedback. You may receive a survey about your visit today. Please share your experience as we strive to create trusting relationships with our patients to provide genuine, compassionate, quality care.  We appreciate your understanding and patience as we review any laboratory studies, imaging, and other diagnostic tests that are ordered as we care for you. We do our  best to address any and all results in a timely manner. If you do not hear about test results within 1 week, please do not hesitate to contact us. If we referred you to a specialist during your visit or ordered imaging testing, contact the office if you have not been contacted to be scheduled within 1 weeks.  We also encourage the use of MyChart, which contains your medical information for your review as well. If you are not enrolled in this feature, an access code is on this after visit summary for your convenience. Thank you for allowing [place] to be involved in your care.

## 2019-07-14 NOTE — Assessment & Plan Note (Signed)
These seem well controlled and have apparently resolved with the use of mustard intake daily.  She is encouraged to let me know if this becomes bothersome again.

## 2019-07-14 NOTE — Progress Notes (Signed)
Subjective:  Patient ID: Abigail Gill, female    DOB: 03/16/1961  Age: 58 y.o. MRN: [id number]  CC:  Chief Complaint  Patient presents with  . COPD  . Other    leg cramps  . Osteoporosis  . other    Vitamin D deficiency  . Back Pain  . Chest Pain      HPI  This patient arrives to the office today to undergo exam for recertification of medical necessity.  She continues using her oxygen 2 L/min.  She originally qualified for at home oxygen use in November 2019.  At that time her oxygen saturation dropped to 88% with activity.  She was placed on 2 L/min and continues on this rate.  She is able to stay active in her home and outside of her home with the use of her oxygen.  She has not had any COPD exacerbations within the last year.  She tells me she continues using her inhalers as prescribed.   At her last office visit she also had complained of intermittent leg cramps.  These cramps occur only at night when she is either changing position or stretching.  They have been ongoing for about 5 years.  She has undergone some work-up including vascular work-up.  She was taking tizanidine for these cramps but had told me that it was not very helpful.  At her last office visit I encouraged her to try some stretches.  Today she tells me that she has started eating mustard daily, and since then her leg cramps have subsided.  She also has a history of lung nodules noted on CT scan.  And routine follow-up of CT scan to monitor these nodules was recommended.  She is overdue for this scan.  She has been try to hold off due to the coronavirus pandemic.  Today she tells me that [facility], and that they were going to call her to reschedule her CT scan sometime after January.  She also has a history of osteoporosis and arrives today with severe intermittent back pain.  She believes she may have a fracture to her spine.  She tells me her pain started about 2 weeks ago.  She  describes the pain as a 10 out of 10, aching feeling.  It is intermittent.  When the pain is bothersome she has a hard time walking, sleeping, and sitting.  She does have pain radiate down to her right leg.  She tells me she has had kyphoplasty in the past and has been evaluated by neurosurgeon.  She tells me it can take months to get into that office and would like to have imaging completed as soon as possible.  She has not taken any medication to improve her pain.  She denies any weakness or sensation changes.  She also denies any new bowel or bladder incontinence.  She has a history of vitamin D deficiency.  She is currently on a vitamin D supplement.  She tells me that she thinks she is taking 50,000 units daily.  She tells me she did get the dose over-the-counter.  New  She also tells me that on Friday (07/09/2019) she had an episode of left chest pain that lasted 1 hour.  She tells me she was helping her sister pain some foot boards, and the pain came on suddenly.  She did have some diaphoresis and shortness of breath with it she did not have any nausea or vomiting  with it.  She has not had any more chest pain since then nor has she ever had chest pain in the past.  She says the pain did radiate to her back.  She does rate the pain at an 8 out of 10 and describes it as sharp.  Past Medical History:  Diagnosis Date  . Asthma   . Back pain   . COPD (chronic obstructive pulmonary disease) ([place])   . GERD (gastroesophageal reflux disease)   . Osteoporosis   . Shortness of breath    with exertion      Family History  Problem Relation Age of Onset  . Alcohol abuse Mother   . Arthritis Mother   . COPD Mother   . Hyperlipidemia Mother   . Pancreatitis Mother   . Heart failure Father   . Alcohol abuse Father   . Arthritis Father   . Diabetes Father   . Heart disease Father   . Hyperlipidemia Father   . Hypertension Father   . Kidney disease Father   . Stroke Father   . COPD Sister   .  Anesthesia problems Neg Hx   . Hypotension Neg Hx   . Malignant hyperthermia Neg Hx   . Pseudochol deficiency Neg Hx     Social History   Social History Narrative   Disabled due to COPD   Was a Occupational psychologist   Lives at home with Juanda Crumble   2 chihuahuas   Social History   Tobacco Use  . Smoking status: Former Smoker    Packs/day: 1.00    Years: 30.00    Pack years: 30.00    Types: Cigarettes    Start date: 09/16/1976    Quit date: 04/18/2009    Years since quitting: 10.2  . Smokeless tobacco: Never Used  Substance Use Topics  . Alcohol use: No    Alcohol/week: 0.0 standard drinks     Current Meds  Medication Sig  . albuterol (PROVENTIL HFA;VENTOLIN HFA) 108 (90 BASE) MCG/ACT inhaler Inhale 2 puffs into the lungs every 4 (four) hours as needed. For shortness of breath   . cetirizine (ZYRTEC) 10 MG tablet Take 10 mg by mouth at bedtime.    . cholecalciferol (VITAMIN D3) 25 MCG (1000 UT) tablet Take 5,000 Units by mouth daily.  . Fluticasone-Salmeterol (ADVAIR) 250-50 MCG/DOSE AEPB Inhale 1 puff into the lungs every 12 (twelve) hours.      ROS:  Review of Systems  Constitutional: Negative for chills and fever.  Respiratory: Positive for shortness of breath. Negative for cough.   Cardiovascular: Positive for chest pain. Negative for palpitations.  Gastrointestinal: Negative for nausea and vomiting.  Musculoskeletal: Positive for back pain.  Neurological: Negative for dizziness, sensory change and weakness.     Objective:   Today's Vitals: BP 112/68   Pulse 99   Resp 20   Ht 5\' 2"  (1.575 m)   Wt 163 lb 12.8 oz (74.3 kg)   SpO2 90%   BMI 29.96 kg/m  Vitals with BMI 07/14/2019 01/01/2018 12/26/2017  Height 5\' 2"  5\' 2"  -  Weight 163 lbs 13 oz 163 lbs -  BMI 71.06 26.94 -  Systolic 854 627 035  Diastolic 68 72 72  Pulse 99 74 87     Physical Exam Vitals signs reviewed.  Constitutional:      Appearance: She is well-developed.  HENT:     Head: Normocephalic and  atraumatic.  Cardiovascular:     Rate and Rhythm: Normal rate  and regular rhythm.     Heart sounds: Normal heart sounds.  Pulmonary:     Effort: Pulmonary effort is normal. No tachypnea or respiratory distress.     Breath sounds: Decreased breath sounds present. No wheezing.  Musculoskeletal:     Thoracic back: She exhibits tenderness and bony tenderness.     Lumbar back: She exhibits tenderness and bony tenderness.     Comments: Positive straight leg raise to the right leg  Skin:    General: Skin is warm and dry.  Neurological:     General: No focal deficit present.     Mental Status: She is alert and oriented to person, place, and time.     Motor: No weakness.  Psychiatric:        Mood and Affect: Mood normal.        Behavior: Behavior normal.          Assessment   1. Panlobular emphysema (HCC)   2. Obstructive chronic bronchitis without exacerbation (HCC)   3. Chronic respiratory failure, unspecified whether with hypoxia or hypercapnia (HCC)   4. Leg cramps   5. Lung nodules   6. Acute midline low back pain with right-sided sciatica   7. Vitamin D deficiency   8. Osteoporosis, unspecified osteoporosis type, unspecified pathological fracture presence   9. Chest pain, unspecified type       Tests ordered Orders Placed This Encounter  Procedures  . DG Lumbar Spine Complete  . Vitamin D, 25-hydroxy  . Basic Metabolic Panel  . Ambulatory referral to Cardiology  . EKG 12-Lead     Plan: Please see assessment and plan per problem list below.   Meds ordered this encounter  Medications  . ibuprofen (ADVIL) 800 MG tablet    Sig: Take 1 tablet (800 mg total) by mouth every 8 (eight) hours as needed.    Dispense:  30 tablet    Refill:  0    Order Specific Question:   Supervising Provider    Answer:   Wilson Singer [[id number]]    Patient to follow-up in 6 weeks, but was encouraged to call the office of any questions or concerns prior to this upcoming  appointment.  Elenore Paddy, NP

## 2019-07-14 NOTE — Assessment & Plan Note (Signed)
She will continue on her vitamin D supplement.  We will collect serum level of vitamin D today and will collect metabolic panel.  I told her that she should be taking 5000 to 10,000 units daily not 50,000.  Thus she need to go home and look at her bottle and if the dose is 50,000 she should only take 1 of those per week.  She tells me she understands.

## 2019-07-14 NOTE — Assessment & Plan Note (Signed)
Patient was told to continue using her oxygen as prescribed.  We also discussed the importance of staying up-to-date with immunizations such as a yearly flu vaccine and to be immunized against pneumonia as due.  She was encouraged to continue using her inhalers as prescribed.  She is encouraged to continue avoiding cigarette smoke.  She was also encouraged to continue being as active as she can tolerate.  I will send recertification of medical necessity of oxygen to [facility] her home health agency. 

## 2019-07-14 NOTE — Assessment & Plan Note (Addendum)
Patient was told to continue using her oxygen as prescribed.  We also discussed the importance of staying up-to-date with immunizations such as a yearly flu vaccine and to be immunized against pneumonia as due.  She was encouraged to continue using her inhalers as prescribed.  She is encouraged to continue avoiding cigarette smoke.  She was also encouraged to continue being as active as she can tolerate.  I will send recertification of medical necessity of oxygen to [facility] her home health agency.

## 2019-07-14 NOTE — Assessment & Plan Note (Signed)
She will undergo CT scan to monitor this.  I told her to call me if she is not contacted in January of this upcoming year to have this done.  She tells me she understands.

## 2019-07-14 NOTE — Assessment & Plan Note (Signed)
We discussed how to treat osteoporosis.  Since she had a allergic reaction to bisphosphonate in the past, she is not a candidate for this.  She will continue on her vitamin D supplement and calcium supplement.  She was encouraged to remain active with weightbearing exercises (however she will need to hold off right now until we determine if she has a spine fracture).  We also discussed avoiding falls is another important aspect of treatment plan.  She was encouraged to avoid throw rugs, use appropriate lighting at home, use railings when going up and down stairs, and use grab bars in her bathroom.

## 2019-07-14 NOTE — Assessment & Plan Note (Signed)
I am concerned that she may have a spine fracture.  I encouraged her to call her neurosurgeon to try and get an appointment as soon as possible with them.  In the meantime we will send her for back x-ray, further recommendations will be made based on those results.

## 2019-07-14 NOTE — Assessment & Plan Note (Signed)
EKG performed in office today.  It was normal sinus rhythm.  I will refer her to cardiology for further evaluation.

## 2019-07-15 DIAGNOSIS — M5416 Radiculopathy, lumbar region: Secondary | ICD-10-CM | POA: Insufficient documentation

## 2019-07-15 LAB — BASIC METABOLIC PANEL
BUN: 12 mg/dL (ref 7–25)
CO2: 26 mmol/L (ref 20–32)
Calcium: 9.5 mg/dL (ref 8.6–10.4)
Chloride: 102 mmol/L (ref 98–110)
Creat: 0.68 mg/dL (ref 0.50–0.99)
Glucose, Bld: 89 mg/dL (ref 65–99)
Potassium: 4.2 mmol/L (ref 3.5–5.3)
Sodium: 138 mmol/L (ref 135–146)

## 2019-07-15 LAB — VITAMIN D 25 HYDROXY (VIT D DEFICIENCY, FRACTURES): Vit D, 25-Hydroxy: 44 ng/mL (ref 30–100)

## 2019-08-26 ENCOUNTER — Telehealth (INDEPENDENT_AMBULATORY_CARE_PROVIDER_SITE_OTHER): Payer: Medicare Other | Admitting: Nurse Practitioner

## 2019-08-26 ENCOUNTER — Other Ambulatory Visit: Payer: Self-pay

## 2019-08-26 ENCOUNTER — Encounter (INDEPENDENT_AMBULATORY_CARE_PROVIDER_SITE_OTHER): Payer: Self-pay | Admitting: Nurse Practitioner

## 2019-08-26 VITALS — BP 106/75 | HR 80 | Ht 62.0 in | Wt 161.0 lb

## 2019-08-26 DIAGNOSIS — R079 Chest pain, unspecified: Secondary | ICD-10-CM

## 2019-08-26 DIAGNOSIS — E559 Vitamin D deficiency, unspecified: Secondary | ICD-10-CM

## 2019-08-26 DIAGNOSIS — M545 Low back pain, unspecified: Secondary | ICD-10-CM

## 2019-08-26 DIAGNOSIS — R918 Other nonspecific abnormal finding of lung field: Secondary | ICD-10-CM

## 2019-08-26 DIAGNOSIS — J439 Emphysema, unspecified: Secondary | ICD-10-CM | POA: Diagnosis not present

## 2019-08-26 MED ORDER — FLUTICASONE-SALMETEROL 250-50 MCG/DOSE IN AEPB: 1.0000 | INHALATION_SPRAY | Freq: Two times a day (BID) | RESPIRATORY_TRACT | 3 refills | Status: DC

## 2019-08-26 NOTE — Assessment & Plan Note (Signed)
This appears resolved at this time.  I again encouraged her to follow-up with cardiology, or to let us rerefer her if she were to experience any more chest pain.  She tells me she understands.  I also explained to her that while an EKG was done and her most recent emergency room visit, this cannot completely rule out coronary artery disease thus evaluation by cardiologist may be warranted if she experiences any more chest pain.  She tells me she understands and would like to hold off on cardiology referral at this time.  She is encouraged to let me know she changes her mind.

## 2019-08-26 NOTE — Progress Notes (Signed)
Due to national recommendations of social distancing related to the COVID19 pandemic, an audio/visual tele-health visit was felt to be the most appropriate encounter type for this patient today. I connected with  [name] on 08/26/19 utilizing audio-only technology and verified that I am speaking with the correct person using two identifiers. The patient was located at their home, and I was located at the office of [facility] during the encounter.  The patient did not have access to technology allowing her to do visual virtual visit.  Thus visit was conducted with audio technology only.  I discussed the limitations of evaluation and management by telemedicine. The patient expressed understanding and agreed to proceed.    ' Subjective:  Patient ID: Abigail Gill, female    DOB: 11/25/1960  Age: 58 y.o. MRN: [id number]  CC:  Chief Complaint  Patient presents with  . Follow-up      HPI  This patient presents for a virtual office visit to follow-up with her chronic conditions.  COPD: She tells me her breathing is stable.  She continues on her Advair daily as well as as needed albuterol.  She denies any recent exacerbations.  She is due for pneumonia vaccination which we will have to do at her next office visit.  Lung nodules: She recently did go to the hospital secondary to back pain and underwent CT scan.  At that time they found that she did have a kidney stone which has since passed.  It was also noted that she did continue to have nodules on her lungs.  Unfortunately she went to a emergency room outside of the [facility] health system.  She does have hard copies of these results and is willing to bring them by the office so that we can review them.  No new respiratory symptoms are occurring currently.  Back pain: She did have an episode of severe back pain recently and underwent x-rays which show that there is no fracture.  After having been seen in the emergency department it  was determined the etiology of her pain was most likely related to kidney stone.  She continues to take ibuprofen as needed.  Chest pain: Her last office visit she did mention that she had one episode of chest pain and was referred to cardiology.  It appears that when she was contacted by cardiology she stated that she no longer needed service and declined referral.  Today she denies any further episodes of chest pain and is not interested in being seen by cardiology at this time.  Vitamin D deficiency.  She continues on 5000 international units of vitamin D3 daily.  Last serum level was collected in October 2020 and was 44.  Past Medical History:  Diagnosis Date  . Asthma   . Back pain   . COPD (chronic obstructive pulmonary disease) (HCC)   . GERD (gastroesophageal reflux disease)   . Osteoporosis   . Shortness of breath    with exertion      Family History  Problem Relation Age of Onset  . Alcohol abuse Mother   . Arthritis Mother   . COPD Mother   . Hyperlipidemia Mother   . Pancreatitis Mother   . Heart failure Father   . Alcohol abuse Father   . Arthritis Father   . Diabetes Father   . Heart disease Father   . Hyperlipidemia Father   . Hypertension Father   . Kidney disease Father   . Stroke  Father   . COPD Sister   . Anesthesia problems Neg Hx   . Hypotension Neg Hx   . Malignant hyperthermia Neg Hx   . Pseudochol deficiency Neg Hx     Social History   Social History Narrative   Disabled due to COPD   Was a Associate Professorpharmacy tech   Lives at home with Leonette Mostharles   2 chihuahuas   Social History   Tobacco Use  . Smoking status: Former Smoker    Packs/day: 1.00    Years: 30.00    Pack years: 30.00    Types: Cigarettes    Start date: 09/16/1976    Quit date: 04/18/2009    Years since quitting: 10.3  . Smokeless tobacco: Never Used  Substance Use Topics  . Alcohol use: No    Alcohol/week: 0.0 standard drinks     Current Meds  Medication Sig  . albuterol  (PROVENTIL HFA;VENTOLIN HFA) 108 (90 BASE) MCG/ACT inhaler Inhale 2 puffs into the lungs every 4 (four) hours as needed. For shortness of breath   . cetirizine (ZYRTEC) 10 MG tablet Take 10 mg by mouth at bedtime.    . cholecalciferol (VITAMIN D3) 25 MCG (1000 UT) tablet Take 5,000 Units by mouth daily.  . Fluticasone-Salmeterol (ADVAIR) 250-50 MCG/DOSE AEPB Inhale 1 puff into the lungs every 12 (twelve) hours.  Marland Kitchen. ibuprofen (ADVIL) 800 MG tablet Take 1 tablet (800 mg total) by mouth every 8 (eight) hours as needed.  . vitamin B-12 (CYANOCOBALAMIN) 500 MCG tablet Take 500 mcg by mouth daily.  . [DISCONTINUED] Fluticasone-Salmeterol (ADVAIR) 250-50 MCG/DOSE AEPB Inhale 1 puff into the lungs every 12 (twelve) hours.      ROS:  Review of Systems  Constitutional: Negative.   Respiratory: Positive for cough and sputum production.        All respiratory symptoms are chronic and she states they are stable in nature  Cardiovascular: Negative.   Musculoskeletal: Negative.      Objective:   Today's Vitals: BP 106/75   Pulse 80   Ht 5\' 2"  (1.575 m)   Wt 161 lb (73 kg)   BMI 29.45 kg/m  Vitals with BMI 08/26/2019 07/14/2019 01/01/2018  Height 5\' 2"  5\' 2"  5\' 2"   Weight 161 lbs 163 lbs 13 oz 163 lbs  BMI 29.44 29.95 29.81  Systolic 106 112 [id number]  Diastolic 75 68 72  Pulse 80 99 74     Physical Exam Comprehensive physical exam not completed today as office visit was conducted remotely.  Patient did sound well over the phone.  She answered questions appropriately and seemed to have appropriate judgment.  Vital signs were collected utilizing patient's at home equipment.      Assessment   1. Pulmonary emphysema, unspecified emphysema type (HCC)   2. Low back pain, unspecified back pain laterality, unspecified chronicity, unspecified whether sciatica present   3. Vitamin D deficiency   4. Lung nodules   5. Chest pain, unspecified type       Tests ordered No orders of the defined types  were placed in this encounter.    Plan: Please see assessment and plan per problem list below.   Meds ordered this encounter  Medications  . Fluticasone-Salmeterol (ADVAIR) 250-50 MCG/DOSE AEPB    Sig: Inhale 1 puff into the lungs every 12 (twelve) hours.    Dispense:  60 each    Refill:  3    Order Specific Question:   Supervising Provider    Answer:   Karilyn CotaGOSRANI,  Redan C [[id number]]   This telephone visit lasted for 12 minutes and 20 seconds Patient to follow-up in 4 months.  Ailene Ards, NP

## 2019-08-26 NOTE — Assessment & Plan Note (Signed)
This appears stable this time.  She is due for pneumonia vaccination which will have to administer at her next office visit.  She will continue current treatment regimen at this time.

## 2019-08-26 NOTE — Assessment & Plan Note (Signed)
Resolved at this time.  No further or new treatment regimen recommendations made today.

## 2019-08-26 NOTE — Assessment & Plan Note (Signed)
She will continue on current vitamin D3 supplementation.

## 2019-08-26 NOTE — Assessment & Plan Note (Signed)
She will bring my copies of her CT scan informtion from her most recent hospital visit which I will review.  May need to consider further diagnostic testing based upon these results.

## 2019-08-31 ENCOUNTER — Ambulatory Visit: Payer: Medicare Other | Admitting: Cardiology

## 2019-09-06 ENCOUNTER — Telehealth (INDEPENDENT_AMBULATORY_CARE_PROVIDER_SITE_OTHER): Payer: Self-pay | Admitting: Nurse Practitioner

## 2019-09-06 NOTE — Telephone Encounter (Signed)
I called this patient today and she is willing to come to the office to sign a release of information form so that we can request most recent CT scan of the chest from [facility] over in [facility].  She tells me she will arrive tomorrow morning (09/07/2019), if we cannot find these forms, please obtain a copy of the form from [facility] by tomorrow morning so that we have it available for the patient to sign. Let me know what I need to fill out so that we get the appropriate test results/records that I am trying to obtain. Thank you.

## 2019-09-07 NOTE — Telephone Encounter (Signed)
WAITIN FOR INFORMATION TO BE SENT BACK.

## 2019-12-03 DIAGNOSIS — S52502A Unspecified fracture of the lower end of left radius, initial encounter for closed fracture: Secondary | ICD-10-CM | POA: Insufficient documentation

## 2019-12-03 HISTORY — DX: Unspecified fracture of the lower end of left radius, initial encounter for closed fracture: S52.502A

## 2020-01-05 DIAGNOSIS — Z8781 Personal history of (healed) traumatic fracture: Secondary | ICD-10-CM | POA: Insufficient documentation

## 2020-01-05 DIAGNOSIS — Z9889 Other specified postprocedural states: Secondary | ICD-10-CM | POA: Insufficient documentation

## 2020-01-11 ENCOUNTER — Ambulatory Visit (INDEPENDENT_AMBULATORY_CARE_PROVIDER_SITE_OTHER): Payer: Medicare Other | Admitting: Nurse Practitioner

## 2020-01-13 ENCOUNTER — Ambulatory Visit (INDEPENDENT_AMBULATORY_CARE_PROVIDER_SITE_OTHER): Payer: Medicare Other | Admitting: Nurse Practitioner

## 2020-01-13 ENCOUNTER — Encounter (INDEPENDENT_AMBULATORY_CARE_PROVIDER_SITE_OTHER): Payer: Self-pay | Admitting: Nurse Practitioner

## 2020-01-13 ENCOUNTER — Other Ambulatory Visit: Payer: Self-pay

## 2020-01-13 VITALS — BP 120/72 | HR 83 | Temp 97.6°F | Resp 15 | Ht 62.0 in | Wt 164.6 lb

## 2020-01-13 DIAGNOSIS — E559 Vitamin D deficiency, unspecified: Secondary | ICD-10-CM | POA: Diagnosis not present

## 2020-01-13 DIAGNOSIS — Z23 Encounter for immunization: Secondary | ICD-10-CM

## 2020-01-13 DIAGNOSIS — M8000XD Age-related osteoporosis with current pathological fracture, unspecified site, subsequent encounter for fracture with routine healing: Secondary | ICD-10-CM

## 2020-01-13 DIAGNOSIS — M810 Age-related osteoporosis without current pathological fracture: Secondary | ICD-10-CM

## 2020-01-13 DIAGNOSIS — J9610 Chronic respiratory failure, unspecified whether with hypoxia or hypercapnia: Secondary | ICD-10-CM

## 2020-01-13 DIAGNOSIS — R918 Other nonspecific abnormal finding of lung field: Secondary | ICD-10-CM | POA: Diagnosis not present

## 2020-01-13 DIAGNOSIS — J439 Emphysema, unspecified: Secondary | ICD-10-CM

## 2020-01-13 LAB — COMPLETE METABOLIC PANEL WITH GFR
AG Ratio: 1.8 (calc) (ref 1.0–2.5)
ALT: 13 U/L (ref 6–29)
AST: 11 U/L (ref 10–35)
Albumin: 4.3 g/dL (ref 3.6–5.1)
Alkaline phosphatase (APISO): 92 U/L (ref 37–153)
BUN: 14 mg/dL (ref 7–25)
CO2: 29 mmol/L (ref 20–32)
Calcium: 9.3 mg/dL (ref 8.6–10.4)
Chloride: 103 mmol/L (ref 98–110)
Creat: 0.7 mg/dL (ref 0.50–0.99)
GFR, Est African American: 108 mL/min/{1.73_m2} (ref >=60)
GFR, Est Non African American: 94 mL/min/{1.73_m2} (ref >=60)
Globulin: 2.4 g/dL (calc) (ref 1.9–3.7)
Glucose, Bld: 99 mg/dL (ref 65–99)
Potassium: 4.3 mmol/L (ref 3.5–5.3)
Sodium: 140 mmol/L (ref 135–146)
Total Bilirubin: 0.4 mg/dL (ref 0.2–1.2)
Total Protein: 6.7 g/dL (ref 6.1–8.1)

## 2020-01-13 LAB — VITAMIN D 25 HYDROXY (VIT D DEFICIENCY, FRACTURES): Vit D, 25-Hydroxy: 37 ng/mL (ref 30–100)

## 2020-01-13 MED ORDER — FLUTICASONE-SALMETEROL 250-50 MCG/DOSE IN AEPB: 1.0000 | INHALATION_SPRAY | Freq: Two times a day (BID) | RESPIRATORY_TRACT | 3 refills | Status: DC

## 2020-01-13 MED ORDER — ALENDRONATE SODIUM 10 MG PO TABS: 10.0000 mg | ORAL_TABLET | Freq: Every day | ORAL | 0 refills | Status: DC

## 2020-01-13 NOTE — Progress Notes (Signed)
Subjective:  Patient ID: Abigail Gill, female    DOB: [birth year]  Age: 59 y.o. MRN: [id number]  CC:  Chief Complaint  Patient presents with  . Follow-up    Lung nodules, osteoporosis, vitamin D deficiency  . COPD      HPI  Lung nodules: She had a CT scan completed on her chest at [facility] approximate 3 months ago.  At that time it was noted that she has a lung nodule that was 1 x 2 cm in size.  Per chart review in October 2019 nodules were noted in her left lung and they were both about 6 mm in size.  Recommendations from [facility] was to have CT scan repeated in approximately 3 months, which would be due now.  Osteoporosis: She has history of osteoporosis and also recently fell and has experienced a fracture to her left arm.  She does take vitamin D3 supplement 5000 IUs daily, she sometimes tells me she will take a calcium supplement but not regularly.  She is not on any medication to treat her osteoporosis.  She tells me she has tried Fosamax in the past and had significant nausea and vomiting with it.  But denies anaphylactic allergic reaction.  Vitamin D deficiency: As stated above she continues on vitamin D3 supplement.  Last serum level was 44 and this was collected in October 2020.  COPD: She continues on her albuterol as needed and Advair daily.  She tells me that her breathing is subjectively stable.  She is due for Pneumovax vaccine.  She tells me she did get the [place] COVID-19 vaccine but this was administered more than 2 weeks ago.   Past Medical History:  Diagnosis Date  . Asthma   . Back pain   . COPD (chronic obstructive pulmonary disease) ([place])   . GERD (gastroesophageal reflux disease)   . Osteoporosis   . Shortness of breath    with exertion      Family History  Problem Relation Age of Onset  . Alcohol abuse Mother   . Arthritis Mother   . COPD Mother   . Hyperlipidemia Mother   . Pancreatitis Mother   . Heart failure Father    . Alcohol abuse Father   . Arthritis Father   . Diabetes Father   . Heart disease Father   . Hyperlipidemia Father   . Hypertension Father   . Kidney disease Father   . Stroke Father   . COPD Sister   . Anesthesia problems Neg Hx   . Hypotension Neg Hx   . Malignant hyperthermia Neg Hx   . Pseudochol deficiency Neg Hx     Social History   Social History Narrative   Disabled due to COPD   Was a Occupational psychologist   Lives at home with Juanda Crumble   2 chihuahuas   Social History   Tobacco Use  . Smoking status: Former Smoker    Packs/day: 1.00    Years: 30.00    Pack years: 30.00    Types: Cigarettes    Start date: 09/16/1976    Quit date: 04/18/2009    Years since quitting: 10.7  . Smokeless tobacco: Never Used  Substance Use Topics  . Alcohol use: No    Alcohol/week: 0.0 standard drinks     Current Meds  Medication Sig  . albuterol (PROVENTIL HFA;VENTOLIN HFA) 108 (90 BASE) MCG/ACT inhaler Inhale 2 puffs into the lungs every 4 (four) hours as needed.  For shortness of breath   . cetirizine (ZYRTEC) 10 MG tablet Take 10 mg by mouth at bedtime.    . cholecalciferol (VITAMIN D3) 25 MCG (1000 UT) tablet Take 5,000 Units by mouth daily.  . Fluticasone-Salmeterol (ADVAIR) 250-50 MCG/DOSE AEPB Inhale 1 puff into the lungs every 12 (twelve) hours.  Marland Kitchen ibuprofen (ADVIL) 800 MG tablet Take 1 tablet (800 mg total) by mouth every 8 (eight) hours as needed.  . pregabalin (LYRICA) 25 MG capsule Take 25 mg by mouth 3 (three) times daily.  . vitamin B-12 (CYANOCOBALAMIN) 500 MCG tablet Take 500 mcg by mouth daily.    ROS:  Review of Systems  Constitutional: Negative.   Eyes: Negative.   Respiratory: Positive for cough and shortness of breath.   Cardiovascular: Negative.  Negative for chest pain.  Neurological: Negative.      Objective:   Today's Vitals: BP 120/72   Pulse 83   Temp 97.6 F (36.4 C) (Temporal)   Resp 15   Ht '5\' 2"'  (1.575 m)   Wt 164 lb 9.6 oz (74.7 kg)   SpO2  95%   BMI 30.11 kg/m  Vitals with BMI 01/13/2020 08/26/2019 07/14/2019  Height '5\' 2"'  '5\' 2"'  '5\' 2"'   Weight 164 lbs 10 oz 161 lbs 163 lbs 13 oz  BMI 30.1 13.08 65.78  Systolic 469 629 528  Diastolic 72 75 68  Pulse 83 80 99     Physical Exam Vitals reviewed.  Constitutional:      General: She is not in acute distress.    Appearance: Normal appearance.  HENT:     Head: Normocephalic and atraumatic.  Neck:     Vascular: No carotid bruit.  Cardiovascular:     Rate and Rhythm: Normal rate and regular rhythm.     Pulses: Normal pulses.     Heart sounds: Normal heart sounds.  Pulmonary:     Effort: Pulmonary effort is normal.     Breath sounds: Normal breath sounds.  Skin:    General: Skin is warm and dry.  Neurological:     General: No focal deficit present.     Mental Status: She is alert and oriented to person, place, and time.  Psychiatric:        Mood and Affect: Mood normal.        Behavior: Behavior normal.        Judgment: Judgment normal.          Assessment and Plan   1. Osteoporosis, unspecified osteoporosis type, unspecified pathological fracture presence   2. Immunization due   3. Vitamin D deficiency   4. Lung nodules   5. Osteoporosis with current pathological fracture with routine healing, unspecified osteoporosis type, subsequent encounter   6. Chronic respiratory failure, unspecified whether with hypoxia or hypercapnia ([place])      Plan: 1.,  5.  We discussed different options regarding treating her osteoporosis.  She would like to first try Fosamax again.  We discussed either doing it once a week or every day, she would like to take it every day.  I did recommend that if she experiences nausea and vomiting again that she should let us know we can consider other options such as injection or infusions.  She tells me she understands.  I also recommend that she try to get least 1200 mg of calcium per day.  2.  We will administer PPSV23 today.  3.  We  will check serum levels for further evaluation of her  vitamin D deficiency.  For now she will continue on her current supplement dosage.  4.  I will order repeat CT scan of the chest for further evaluation of her lung nodules.  6.  She will continue on her current medication regimen for now.  I did discuss that if her breathing worsens that we can consider changing her Advair to [facility].  She tells me she understands would like to stay on her current regimen for now.  Tests ordered Orders Placed This Encounter  Procedures  . CT Chest Wo Contrast  . Pneumococcal polysaccharide vaccine 23-valent greater than or equal to 2yo subcutaneous/IM  . Vitamin D, 25-hydroxy  . CMP with eGFR([facility])      Meds ordered this encounter  Medications  . alendronate (FOSAMAX) 10 MG tablet    Sig: Take 1 tablet (10 mg total) by mouth daily before breakfast. Take with a full glass of water on an empty stomach.    Dispense:  90 tablet    Refill:  0    Order Specific Question:   Supervising Provider    Answer:   Doree Albee [[id number]]    Patient to follow-up in 3 months for annual physical exam  Ailene Ards, NP

## 2020-01-13 NOTE — Addendum Note (Signed)
Addended by: Elenore Paddy on: 01/13/2020 09:28 AM   Modules accepted: Orders

## 2020-01-28 ENCOUNTER — Ambulatory Visit (HOSPITAL_COMMUNITY): Payer: Medicare Other

## 2020-02-11 ENCOUNTER — Telehealth (INDEPENDENT_AMBULATORY_CARE_PROVIDER_SITE_OTHER): Payer: Medicare Other | Admitting: Nurse Practitioner

## 2020-02-11 ENCOUNTER — Telehealth (INDEPENDENT_AMBULATORY_CARE_PROVIDER_SITE_OTHER): Payer: Self-pay | Admitting: Nurse Practitioner

## 2020-02-11 ENCOUNTER — Other Ambulatory Visit: Payer: Self-pay

## 2020-02-11 ENCOUNTER — Encounter (INDEPENDENT_AMBULATORY_CARE_PROVIDER_SITE_OTHER): Payer: Self-pay | Admitting: Nurse Practitioner

## 2020-02-11 VITALS — BP 104/74 | HR 96 | Temp 99.0°F | Ht 62.0 in | Wt 161.0 lb

## 2020-02-11 DIAGNOSIS — R059 Cough, unspecified: Secondary | ICD-10-CM

## 2020-02-11 DIAGNOSIS — R05 Cough: Secondary | ICD-10-CM | POA: Diagnosis not present

## 2020-02-11 DIAGNOSIS — Z0000 Encounter for general adult medical examination without abnormal findings: Secondary | ICD-10-CM | POA: Diagnosis not present

## 2020-02-11 MED ORDER — PREDNISONE 20 MG PO TABS: 40.0000 mg | ORAL_TABLET | Freq: Every day | ORAL | 0 refills | Status: DC

## 2020-02-11 MED ORDER — AZITHROMYCIN 250 MG PO TABS: ORAL_TABLET | ORAL | 0 refills | Status: DC

## 2020-02-11 MED ORDER — PHENYLEPHRINE-CHLORPHEN-DM 10-4-12.5 MG/5ML PO LIQD: 5.0000 mL | Freq: Four times a day (QID) | ORAL | 0 refills | Status: DC | PRN

## 2020-02-11 NOTE — Telephone Encounter (Signed)
Abigail Gill or [name], please print out the after visit summary from office visit dated 02/11/2020 and mail this to this patient's home.  Thank you.

## 2020-02-11 NOTE — Progress Notes (Signed)
Due to national recommendations of social distancing related to the [place] pandemic, an audio/visual tele-health visit was felt to be the Gill appropriate encounter type for this patient today. I connected with  Abigail Gill on 02/11/20 utilizing audio-only technology and verified that I am speaking with the correct person using two identifiers. The patient was located at their home, and I was located at home during the encounter. I discussed the limitations of evaluation and management by telemedicine. The patient expressed understanding and agreed to proceed.  The patient did not have access to technology that would allow [place] to complete the visit via video.     Subjective:   Abigail Gill is a 59 y.o. female who presents for Medicare Annual (Subsequent) preventive examination.  Review of Systems:  Positive for cough, nasal congestion, headache, sore throat.  Negative for fever and shortness of breath.  Negative for chest pain. Cardiac Risk Factors include: smoking/ tobacco exposure    In addition to her annual wellness visit she does have an acute complaint today.  She tells me that approximately 2 days ago she started experiencing a mild sore throat, nasal congestion, and a cough.  She believes she may have a sinus infection.  She denies any fever, and denies any known exposures to someone sick with COVID-19.  She has already been vaccinated against COVID-19 and this vaccine was completed more than 2 weeks ago. Objective:     Vitals: BP 104/74   Pulse 96   Ht 5\' 2"  (1.575 m)   Wt 161 lb (73 kg)   BMI 29.45 kg/m   Body mass index is 29.45 kg/m.  Advanced Directives 02/11/2020 08/26/2011  Does Patient Have a Medical Advance Directive? No Patient does not have advance directive;Patient would not like information  Would patient like information on creating a medical advance directive? No - Patient declined -  Pre-existing out of facility DNR order (yellow form or pink Gill form) - No     Tobacco Social History   Tobacco Use  Smoking Status Former Smoker  . Packs/day: 1.00  . Years: 30.00  . Pack years: 30.00  . Types: Cigarettes  . Start date: 09/16/1976  . Quit date: 04/18/2009  . Years since quitting: 10.8  Smokeless Tobacco Never Used     Counseling given: Not Answered   Clinical Intake:  Pre-visit preparation completed: Yes  Pain : No/denies pain     BMI - recorded: 29.45 Nutritional Status: BMI 25 -29 Overweight Diabetes: No  How often do you need to have someone help you when you read instructions, pamphlets, or other written materials from your doctor or pharmacy?: 1 - Never What is the last grade level you completed in school?: 9th grade; GED completed  Interpreter Needed?: No  Information entered by :: Jeralyn Ruths, NP-C  Past Medical History:  Diagnosis Date  . Asthma   . Back pain   . COPD (chronic obstructive pulmonary disease) ([place])   . GERD (gastroesophageal reflux disease)   . Osteoporosis   . Shortness of breath    with exertion  . Vitamin D deficiency    Past Surgical History:  Procedure Laterality Date  . BREAST BIOPSY Left    benign  . BREAST ENHANCEMENT SURGERY  1996   bilateral-[place]  . BREAST EXCISIONAL BIOPSY Left    benign  . BREAST LUMPECTOMY  08/30/2011   Procedure: LUMPECTOMY;  Surgeon: Donato Heinz;  Location: [facility];  Service: General;  Laterality: Left;  .  KYPHOPLASTY  2018  . TUBAL LIGATION  1985   Abigail Gill   Family History  Problem Relation Age of Onset  . Alcohol abuse Mother   . Arthritis Mother   . COPD Mother   . Hyperlipidemia Mother   . Pancreatitis Mother   . Heart failure Father   . Alcohol abuse Father   . Arthritis Father   . Diabetes Father   . Heart disease Father        several heart attacks  . Hyperlipidemia Father   . Hypertension Father   . Kidney disease Father   . Stroke Father        several strokes  . COPD Sister   . Hypertension Sister   . Anesthesia  problems Neg Hx   . Hypotension Neg Hx   . Malignant hyperthermia Neg Hx   . Pseudochol deficiency Neg Hx    Social History   Socioeconomic History  . Marital status: Married    Spouse name: Abigail Gill  . Number of children: 3  . Years of education: 47  . Highest education level: Not on file  Occupational History  . Occupation: disabled    Comment: COPD  Tobacco Use  . Smoking status: Former Smoker    Packs/day: 1.00    Years: 30.00    Pack years: 30.00    Types: Cigarettes    Start date: 09/16/1976    Quit date: 04/18/2009    Years since quitting: 10.8  . Smokeless tobacco: Never Used  Substance and Sexual Activity  . Alcohol use: No    Alcohol/week: 0.0 standard drinks  . Drug use: No  . Sexual activity: Yes  Other Topics Concern  . Not on file  Social History Narrative   Disabled due to COPD   Was a Associate Professor   Lives at home with Abigail Gill   2 chihuahuas   Social Determinants of Health   Financial Resource Strain:   . Difficulty of Paying Living Expenses:   Food Insecurity:   . Worried About Programme researcher, broadcasting/film/video in the Last Year:   . Barista in the Last Year:   Transportation Needs:   . Freight forwarder (Medical):   Marland Kitchen Lack of Transportation (Non-Medical):   Physical Activity:   . Days of Exercise per Week:   . Minutes of Exercise per Session:   Stress:   . Feeling of Stress :   Social Connections:   . Frequency of Communication with Friends and Family:   . Frequency of Social Gatherings with Friends and Family:   . Attends Religious Services:   . Active Member of Clubs or Organizations:   . Attends Banker Meetings:   Marland Kitchen Marital Status:     Outpatient Encounter Medications as of 02/11/2020  Medication Sig  . albuterol (PROVENTIL HFA;VENTOLIN HFA) 108 (90 BASE) MCG/ACT inhaler Inhale 2 puffs into the lungs every 4 (four) hours as needed. For shortness of breath   . alendronate (FOSAMAX) 10 MG tablet Take 1 tablet (10 mg total) by  mouth daily before breakfast. Take with a full glass of water on an empty stomach.  . cetirizine (ZYRTEC) 10 MG tablet Take 10 mg by mouth at bedtime.    . cholecalciferol (VITAMIN D3) 25 MCG (1000 UT) tablet Take 10,000 Units by mouth daily.   . Fluticasone-Salmeterol (ADVAIR) 250-50 MCG/DOSE AEPB Inhale 1 puff into the lungs every 12 (twelve) hours.  Marland Kitchen ibuprofen (ADVIL) 800 MG tablet Take 1  tablet (800 mg total) by mouth every 8 (eight) hours as needed.  . vitamin B-12 (CYANOCOBALAMIN) 500 MCG tablet Take 500 mcg by mouth daily.  . [DISCONTINUED] pregabalin (LYRICA) 25 MG capsule Take 25 mg by mouth 3 (three) times daily.  Marland Kitchen azithromycin (ZITHROMAX) 250 MG tablet Take 2 tablets by mouth on day 1, then take 1 tablet by mouth daily.  Marland Kitchen Phenylephrine-Chlorphen-DM 06-20-11.5 MG/5ML LIQD Take 5 mLs by mouth every 6 (six) hours as needed (cough).  . predniSONE (DELTASONE) 20 MG tablet Take 2 tablets (40 mg total) by mouth daily with breakfast.   No facility-administered encounter medications on file as of 02/11/2020.    Activities of Daily Living In your present state of health, do you have any difficulty performing the following activities: 02/11/2020  Hearing? N  Vision? N  Difficulty concentrating or making decisions? N  Walking or climbing stairs? Y  Dressing or bathing? N  Doing errands, shopping? N  Preparing Food and eating ? N  Using the Toilet? N  In the past six months, have you accidently leaked urine? N  Do you have problems with loss of bowel control? N  Managing your Medications? N  Managing your Finances? N  Housekeeping or managing your Housekeeping? N  Some recent data might be hidden    Patient Care Team: Wilson Singer, MD as PCP - General (Internal Medicine)    Assessment:   This is a routine wellness examination for [place].  Exercise Activities and Dietary recommendations Current Exercise Habits: The patient does not participate in regular exercise at present,  Type of exercise: walking, Time (Minutes): 25, Frequency (Times/Week): 4, Weekly Exercise (Minutes/Week): 100, Intensity: Mild, Exercise limited by: respiratory conditions(s)  Goals    . DIET - INCREASE WATER INTAKE    . Exercise 150 minutes per week (moderate activity)    . Exercise 5 X per week (30 min per time)       Fall Risk Fall Risk  02/11/2020 01/13/2020 01/01/2018 10/29/2017 10/16/2017  Falls in the past year? 1 1 No No No  Number falls in past yr: 0 0 - - -  Injury with Fall? 1 1 - - -  Risk for fall due to : History of fall(s) - - - -  Follow up Falls evaluation completed;Education provided;Falls prevention discussed - - - -   Is the patient's home free of loose throw rugs in walkways, pet beds, electrical cords, etc?   yes      Grab bars in the bathroom? yes      Handrails on the stairs?   yes      Adequate lighting?   yes  Timed Get Up and Go performed: Not performed as office visit was conducted remotely  Depression Screen PHQ 2/9 Scores 02/11/2020 01/13/2020 01/01/2018 10/29/2017  PHQ - 2 Score 0 0 0 0     Cognitive Function     6CIT Screen 02/11/2020  What Year? 0 points  What month? 0 points  What time? 0 points  Count back from 20 0 points  Months in reverse 0 points  Repeat phrase 0 points  Total Score 0    Immunization History  Administered Date(s) Administered  . Influenza-Unspecified 06/29/2017, 06/25/2019  . Janssen (J&J) SARS-COV-2 Vaccination 12/14/2019  . Pneumococcal Conjugate-13 07/07/2017  . Pneumococcal Polysaccharide-23 01/13/2020  . Td 01/14/2017    Qualifies for Shingles Vaccine?  Yes  Screening Tests Health Maintenance  Topic Date Due  . HIV Screening  02/10/2021 (  Originally 11/09/1975)  . INFLUENZA VACCINE  04/16/2020  . MAMMOGRAM  11/11/2020  . PAP SMEAR-Modifier  11/14/2021  . COLONOSCOPY  05/28/2026  . TETANUS/TDAP  06/17/2027  . COVID-19 Vaccine  Completed  . Hepatitis C Screening  Completed    Cancer Screenings: Lung: Low  Dose CT Chest recommended if Age 5-80 years, 30 pack-year currently smoking OR have quit w/in 15years. Patient does qualify. Breast:  Up to date on Mammogram? Yes   Up to date of Bone Density/Dexa? No Colorectal: Up-to-date  Additional Screenings:  Hepatitis C Screening: Completed and was negative     Plan:   She is up-to-date with all recommended vaccinations except for shingles vaccine.  We did discuss that going to the pharmacy may be her best option as they can often tell her what her out-of-pocket cost may be prior to having it administered.  She tells me she will consider doing this.  She tells me that she had a Pap smear last year, colon cancer screening was completed in 2017 polyps were noted.  She does not remember whether or not she was told to complete screening again in 5 or 10 years.  She does tell me that the polyps were noncancerous.  She is no longer a smoker.  She is up-to-date with mammogram.  She has elected to not undergo sexual transmitted infection screening.  Depression screening was negative today.  Hepatitis C screening has already been completed.  She does have known lung nodules and I have ordered CT scan of her chest for further evaluation of these.  She tells me she is holding off on getting this done until she can save enough money to pay for her co-pay.  She does plan on getting this completed when she has the money.  She would also be due for DEXA scan.  As for her upper respiratory symptoms, I did encourage her to get tested for COVID-19 virus.  I did tell her that if she chooses not to get tested that she needs to quarantine for at least 10 days from the time that symptoms started.  She should remain quarantined for all 10 days and can come out of quarantine if her symptoms improve after 10 days and she has been afebrile for at least 3 days.  She tells me she understands.  I will send her in an antibiotic, as needed cough syrup, and I will send her in prednisone.   Based on her history of COPD, I encouraged her to use the prednisone if she feels any worsening in her respiratory status no matter how mild.  She tells me she understands.  I have personally reviewed and noted the following in the patient's chart:   . Medical and social history . Use of alcohol, tobacco or illicit drugs  . Current medications and supplements . Functional ability and status . Nutritional status . Physical activity . Advanced directives . List of other physicians . Hospitalizations, surgeries, and ER visits in previous 12 months . Vitals . Screenings to include cognitive, depression, and falls . Referrals and appointments  In addition, I have reviewed and discussed with patient certain preventive protocols, quality metrics, and best practice recommendations. A written personalized care plan for preventive services as well as general preventive health recommendations were provided to patient.    She will follow-up as scheduled in August, at that time she should be encouraged to undergo CT scan of the chest and DEXA scan if she can afford these tests.  Elenore PaddySARAH E Vanesha Athens, NP  02/11/2020

## 2020-02-11 NOTE — Patient Instructions (Signed)
  Abigail Gill , Thank you for taking time to come for your Medicare Wellness Visit. I appreciate your ongoing commitment to your health goals. Please review the following plan we discussed and let me know if I can assist you in the future.   These are the goals we discussed: Goals    . DIET - INCREASE WATER INTAKE    . Exercise 150 minutes per week (moderate activity)    . Exercise 5 X per week (30 min per time)       This is a list of the screening recommended for you and due dates:  Health Maintenance  Topic Date Due  . HIV Screening  02/10/2021*  . Flu Shot  04/16/2020  . Mammogram  11/11/2020  . Pap Smear  11/14/2021  . Colon Cancer Screening  05/28/2026  . Tetanus Vaccine  06/17/2027  . COVID-19 Vaccine  Completed  .  Hepatitis C: One time screening is recommended by Center for Disease Control  (CDC) for  adults born from [birth year] through 1967.   Completed  *Topic was postponed. The date shown is not the original due date.

## 2020-02-16 NOTE — Telephone Encounter (Signed)
Done

## 2020-03-14 DIAGNOSIS — G90512 Complex regional pain syndrome I of left upper limb: Secondary | ICD-10-CM

## 2020-03-14 HISTORY — DX: Complex regional pain syndrome i of left upper limb: G90.512

## 2020-04-24 ENCOUNTER — Ambulatory Visit (INDEPENDENT_AMBULATORY_CARE_PROVIDER_SITE_OTHER): Payer: Medicare Other | Admitting: Internal Medicine

## 2020-04-24 ENCOUNTER — Other Ambulatory Visit: Payer: Self-pay

## 2020-04-24 ENCOUNTER — Encounter (INDEPENDENT_AMBULATORY_CARE_PROVIDER_SITE_OTHER): Payer: Self-pay | Admitting: Internal Medicine

## 2020-04-24 VITALS — BP 120/80 | HR 70 | Temp 97.3°F | Ht 62.0 in | Wt 163.6 lb

## 2020-04-24 DIAGNOSIS — Z131 Encounter for screening for diabetes mellitus: Secondary | ICD-10-CM

## 2020-04-24 DIAGNOSIS — E559 Vitamin D deficiency, unspecified: Secondary | ICD-10-CM | POA: Diagnosis not present

## 2020-04-24 DIAGNOSIS — R5381 Other malaise: Secondary | ICD-10-CM

## 2020-04-24 DIAGNOSIS — Z0001 Encounter for general adult medical examination with abnormal findings: Secondary | ICD-10-CM | POA: Diagnosis not present

## 2020-04-24 DIAGNOSIS — M810 Age-related osteoporosis without current pathological fracture: Secondary | ICD-10-CM

## 2020-04-24 DIAGNOSIS — J439 Emphysema, unspecified: Secondary | ICD-10-CM | POA: Diagnosis not present

## 2020-04-24 DIAGNOSIS — R5383 Other fatigue: Secondary | ICD-10-CM

## 2020-04-24 DIAGNOSIS — Z13220 Encounter for screening for lipoid disorders: Secondary | ICD-10-CM

## 2020-04-24 NOTE — Progress Notes (Signed)
Chief Complaint: This 59 year old lady comes in for an annual physical exam and to address her chronic conditions which are described below. HPI: She has a history of osteoporosis and her last bone density scan was approximately 4 years ago. She also has COPD which appears to be stable. She has vitamin D deficiency and takes supplementation. She had a fracture of the left arm/forearm approximately 4 months ago and is under the care of Dr. Jeannett Senior Case, orthopedics in [place].  Past Medical History:  Diagnosis Date  . Asthma   . Back pain   . COPD (chronic obstructive pulmonary disease) (HCC)   . GERD (gastroesophageal reflux disease)   . Osteoporosis   . Shortness of breath    with exertion  . Vitamin D deficiency    Past Surgical History:  Procedure Laterality Date  . BREAST BIOPSY Left    benign  . BREAST ENHANCEMENT SURGERY  1996   bilateral-[place]  . BREAST EXCISIONAL BIOPSY Left    benign  . BREAST LUMPECTOMY  08/30/2011   Procedure: LUMPECTOMY;  Surgeon: Fabio Bering;  Location: [facility];  Service: General;  Laterality: Left;  . KYPHOPLASTY  2018  . TUBAL LIGATION  1985   [name]     Social History   Social History Narrative   Disabled due to COPD   Was a Associate Professor   Lives at home with  Husband,[name] .Married for 19 years.   1 chihuahua.    Social History   Tobacco Use  . Smoking status: Former Smoker    Packs/day: 1.00    Years: 30.00    Pack years: 30.00    Types: Cigarettes    Start date: 09/16/1976    Quit date: 04/18/2009    Years since quitting: 11.0  . Smokeless tobacco: Never Used  Substance Use Topics  . Alcohol use: No    Alcohol/week: 0.0 standard drinks      Allergies:  Allergies  Allergen Reactions  . Fosamax [Alendronate Sodium] Nausea And Vomiting    Vomiting within minutes of taking     Current Meds  Medication Sig  . albuterol (PROVENTIL HFA;VENTOLIN HFA) 108 (90 BASE) MCG/ACT inhaler Inhale 2 puffs into the  lungs every 4 (four) hours as needed. For shortness of breath   . alendronate (FOSAMAX) 10 MG tablet Take 1 tablet (10 mg total) by mouth daily before breakfast. Take with a full glass of water on an empty stomach.  . cetirizine (ZYRTEC) 10 MG tablet Take 10 mg by mouth at bedtime.    . cholecalciferol (VITAMIN D3) 25 MCG (1000 UT) tablet Take 10,000 Units by mouth daily.   . Fluticasone-Salmeterol (ADVAIR) 250-50 MCG/DOSE AEPB Inhale 1 puff into the lungs every 12 (twelve) hours.  Marland Kitchen ibuprofen (ADVIL) 800 MG tablet Take 1 tablet (800 mg total) by mouth every 8 (eight) hours as needed.  Marland Kitchen oxyCODONE-acetaminophen (PERCOCET) 7.5-325 MG tablet Take 1 tablet by mouth every 4 (four) hours as needed for severe pain.  . pregabalin (LYRICA) 100 MG capsule Take 100 mg by mouth 3 (three) times daily.  Marland Kitchen tiZANidine (ZANAFLEX) 2 MG tablet Take 2 mg by mouth every 8 (eight) hours as needed.    Depression screen [facility] 2/9 04/24/2020 02/11/2020 01/13/2020 01/01/2018 10/29/2017  Decreased Interest 0 0 0 0 0  Down, Depressed, Hopeless 0 0 0 0 0  PHQ - 2 Score 0 0 0 0 0     LFY:BOFBP from the symptoms mentioned above,there are no other  symptoms referable to all systems reviewed.       Physical Exam: Blood pressure 120/80, pulse 70, temperature (!) 97.3 F (36.3 C), temperature source Temporal, height 5\' 2"  (1.575 m), weight 163 lb 9.6 oz (74.2 kg), SpO2 90 %. Vitals with BMI 04/24/2020 02/11/2020 01/13/2020  Height 5\' 2"  5\' 2"  5\' 2"   Weight 163 lbs 10 oz 161 lbs 164 lbs 10 oz  BMI 29.92 29.44 30.1  Systolic 120 104 01/15/2020  Diastolic 80 74 72  Pulse 70 96 83      She looks somewhat frail, overweight. General: Alert, cooperative, and appears to be the stated age.No pallor.  No jaundice.  No clubbing. Head: Normocephalic Eyes: Sclera white, pupils equal and reactive to light, red reflex x 2,  Ears: Normal bilaterally Oral cavity: Lips, mucosa, and tongue normal: Teeth and gums normal Neck: No adenopathy,  supple, symmetrical, trachea midline, and thyroid does not appear enlarged Respiratory: Clear to auscultation bilaterally.No wheezing, crackles or bronchial breathing. Cardiovascular: Heart sounds are present and appear to be normal without murmurs or added sounds.  No carotid bruits.  Peripheral pulses are present and equal bilaterally.: Gastrointestinal:positive bowel sounds, no hepatosplenomegaly.  No masses felt.No tenderness. Skin: Clear, No rashes noted.No worrisome skin lesions seen. Neurological: Grossly intact without focal findings, cranial nerves II through XII intact, muscle strength equal bilaterally Musculoskeletal: No acute joint abnormalities noted.Full range of movement noted with joints. Psychiatric: Affect appropriate, non-anxious.    Assessment  1. Encounter for general adult medical examination with abnormal findings   2. Osteoporosis, unspecified osteoporosis type, unspecified pathological fracture presence   3. Vitamin D deficiency   4. Pulmonary emphysema, unspecified emphysema type (HCC)   5. Malaise and fatigue   6. Screening for diabetes mellitus   7. Screening for lipoid disorders     Tests Ordered:   Orders Placed This Encounter  Procedures  . DG Bone Density  . CBC  . COMPLETE METABOLIC PANEL WITH GFR  . Hemoglobin A1c  . T3, free  . T4  . TSH  . VITAMIN D 25 Hydroxy (Vit-D Deficiency, Fractures)  . Lipid panel     Plan  25. 59 year old lady, looks older than her stated age with osteoporosis. 2. I will order a bone density scan again. 3. She will continue with her Advair inhaler for her COPD which appears to be stable. 4. She will continue with Fosamax for osteoporosis.  We will check a bone density scan. 5. Further recommendations will depend on blood results and she will follow-up with Sarah in about the next 3 to 4 months. 6. Today, in addition to a preventative visit, I independently performed an office visit to review her chronic  conditions above.     No orders of the defined types were placed in this encounter.    Claiborne Stroble C Arleen Bar   04/24/2020, 11:16 AM

## 2020-04-25 LAB — CBC
HCT: 40.6 % (ref 35.0–45.0)
Hemoglobin: 13.4 g/dL (ref 11.7–15.5)
MCH: 28.9 pg (ref 27.0–33.0)
MCHC: 33 g/dL (ref 32.0–36.0)
MCV: 87.5 fL (ref 80.0–100.0)
MPV: 10.3 fL (ref 7.5–12.5)
Platelets: 232 10*3/uL (ref 140–400)
RBC: 4.64 10*6/uL (ref 3.80–5.10)
RDW: 13.2 % (ref 11.0–15.0)
WBC: 6.1 10*3/uL (ref 3.8–10.8)

## 2020-04-25 LAB — COMPLETE METABOLIC PANEL WITH GFR
AG Ratio: 1.8 (calc) (ref 1.0–2.5)
ALT: 17 U/L (ref 6–29)
AST: 15 U/L (ref 10–35)
Albumin: 4.4 g/dL (ref 3.6–5.1)
Alkaline phosphatase (APISO): 97 U/L (ref 37–153)
BUN: 16 mg/dL (ref 7–25)
CO2: 31 mmol/L (ref 20–32)
Calcium: 9.3 mg/dL (ref 8.6–10.4)
Chloride: 105 mmol/L (ref 98–110)
Creat: 0.73 mg/dL (ref 0.50–0.99)
GFR, Est African American: 103 mL/min/{1.73_m2} (ref >=60)
GFR, Est Non African American: 89 mL/min/{1.73_m2} (ref >=60)
Globulin: 2.5 g/dL (calc) (ref 1.9–3.7)
Glucose, Bld: 88 mg/dL (ref 65–99)
Potassium: 4.2 mmol/L (ref 3.5–5.3)
Sodium: 144 mmol/L (ref 135–146)
Total Bilirubin: 0.6 mg/dL (ref 0.2–1.2)
Total Protein: 6.9 g/dL (ref 6.1–8.1)

## 2020-04-25 LAB — LIPID PANEL
Cholesterol: 191 mg/dL (ref <200)
HDL: 62 mg/dL (ref >=50)
LDL Cholesterol (Calc): 109 mg/dL (calc) — ABNORMAL HIGH
Non-HDL Cholesterol (Calc): 129 mg/dL (calc) (ref <130)
Total CHOL/HDL Ratio: 3.1 (calc) (ref <5.0)
Triglycerides: 106 mg/dL (ref <150)

## 2020-04-25 LAB — HEMOGLOBIN A1C
Hgb A1c MFr Bld: 5.8 % of total Hgb — ABNORMAL HIGH (ref <5.7)
Mean Plasma Glucose: 120 (calc)
eAG (mmol/L): 6.6 (calc)

## 2020-04-25 LAB — T3, FREE: T3, Free: 3.1 pg/mL (ref 2.3–4.2)

## 2020-04-25 LAB — TSH: TSH: 2.57 mIU/L (ref 0.40–4.50)

## 2020-04-25 LAB — VITAMIN D 25 HYDROXY (VIT D DEFICIENCY, FRACTURES): Vit D, 25-Hydroxy: 39 ng/mL (ref 30–100)

## 2020-04-25 LAB — T4: T4, Total: 6.5 ug/dL (ref 5.1–11.9)

## 2020-04-25 NOTE — Progress Notes (Signed)
Pt was called;given lab results and the Vitamin D she said she is taking 2/day currently.

## 2020-04-25 NOTE — Progress Notes (Signed)
Please call this patient and let her know that she is prediabetic.  She needs to avoid sugar, sweeteners, white flour, soda drinks of all kinds.  We can discuss more fully her diet next time.Her vitamin D levels are not optimal and she needs to make sure she is taking vitamin D3 10,000 units daily.Follow-up as scheduled.

## 2020-05-19 DIAGNOSIS — J449 Chronic obstructive pulmonary disease, unspecified: Secondary | ICD-10-CM | POA: Diagnosis not present

## 2020-05-19 DIAGNOSIS — J9610 Chronic respiratory failure, unspecified whether with hypoxia or hypercapnia: Secondary | ICD-10-CM | POA: Diagnosis not present

## 2020-06-18 DIAGNOSIS — J449 Chronic obstructive pulmonary disease, unspecified: Secondary | ICD-10-CM | POA: Diagnosis not present

## 2020-06-18 DIAGNOSIS — J9610 Chronic respiratory failure, unspecified whether with hypoxia or hypercapnia: Secondary | ICD-10-CM | POA: Diagnosis not present

## 2020-07-01 DIAGNOSIS — Z79899 Other long term (current) drug therapy: Secondary | ICD-10-CM | POA: Diagnosis not present

## 2020-07-01 DIAGNOSIS — R9431 Abnormal electrocardiogram [ECG] [EKG]: Secondary | ICD-10-CM | POA: Diagnosis not present

## 2020-07-01 DIAGNOSIS — J440 Chronic obstructive pulmonary disease with acute lower respiratory infection: Secondary | ICD-10-CM | POA: Diagnosis not present

## 2020-07-01 DIAGNOSIS — J441 Chronic obstructive pulmonary disease with (acute) exacerbation: Secondary | ICD-10-CM | POA: Diagnosis not present

## 2020-07-01 DIAGNOSIS — Z9981 Dependence on supplemental oxygen: Secondary | ICD-10-CM | POA: Diagnosis not present

## 2020-07-01 DIAGNOSIS — R059 Cough, unspecified: Secondary | ICD-10-CM | POA: Diagnosis not present

## 2020-07-01 DIAGNOSIS — R0602 Shortness of breath: Secondary | ICD-10-CM | POA: Diagnosis not present

## 2020-07-01 DIAGNOSIS — K219 Gastro-esophageal reflux disease without esophagitis: Secondary | ICD-10-CM | POA: Diagnosis not present

## 2020-07-13 ENCOUNTER — Telehealth (INDEPENDENT_AMBULATORY_CARE_PROVIDER_SITE_OTHER): Payer: Self-pay

## 2020-07-13 ENCOUNTER — Telehealth (INDEPENDENT_AMBULATORY_CARE_PROVIDER_SITE_OTHER): Payer: Self-pay | Admitting: Nurse Practitioner

## 2020-07-13 ENCOUNTER — Encounter (INDEPENDENT_AMBULATORY_CARE_PROVIDER_SITE_OTHER): Payer: Self-pay | Admitting: Nurse Practitioner

## 2020-07-13 ENCOUNTER — Ambulatory Visit (INDEPENDENT_AMBULATORY_CARE_PROVIDER_SITE_OTHER): Payer: Medicare Other | Admitting: Nurse Practitioner

## 2020-07-13 ENCOUNTER — Other Ambulatory Visit: Payer: Self-pay

## 2020-07-13 VITALS — BP 122/72 | HR 70 | Temp 97.7°F | Ht 62.0 in | Wt 161.0 lb

## 2020-07-13 DIAGNOSIS — Z23 Encounter for immunization: Secondary | ICD-10-CM

## 2020-07-13 DIAGNOSIS — N3090 Cystitis, unspecified without hematuria: Secondary | ICD-10-CM

## 2020-07-13 DIAGNOSIS — B370 Candidal stomatitis: Secondary | ICD-10-CM | POA: Diagnosis not present

## 2020-07-13 DIAGNOSIS — J431 Panlobular emphysema: Secondary | ICD-10-CM

## 2020-07-13 LAB — POCT URINALYSIS DIPSTICK
Bilirubin, UA: NEGATIVE
Blood, UA: NEGATIVE
Glucose, UA: NEGATIVE
Ketones, UA: NEGATIVE
Nitrite, UA: NEGATIVE
Protein, UA: POSITIVE — AB
Spec Grav, UA: <=1.005 — AB (ref 1.010–1.025)
Urobilinogen, UA: 0.2 E.U./dL
pH, UA: 6 (ref 5.0–8.0)

## 2020-07-13 MED ORDER — NYSTATIN 100000 UNIT/ML MT SUSP: 5.0000 mL | Freq: Four times a day (QID) | OROMUCOSAL | 0 refills | Status: DC

## 2020-07-13 MED ORDER — FLUCONAZOLE 150 MG PO TABS: 150.0000 mg | ORAL_TABLET | Freq: Once | ORAL | 0 refills | Status: AC

## 2020-07-13 MED ORDER — SULFAMETHOXAZOLE-TRIMETHOPRIM 800-160 MG PO TABS: 1.0000 | ORAL_TABLET | Freq: Two times a day (BID) | ORAL | 0 refills | Status: DC

## 2020-07-13 NOTE — Addendum Note (Signed)
Addended by: Elenore Paddy on: 07/13/2020 12:38 PM   Modules accepted: Orders

## 2020-07-13 NOTE — Telephone Encounter (Signed)
It is written under physical exam on today's visit. Oxygen dropped to 88% with ambulation on RA.

## 2020-07-13 NOTE — Telephone Encounter (Signed)
Please call this patient and let her know that unfortunately Medicare is requiring that she comes back in the office to do walking sats with and without oxygen.  Tell her I apologize for having her come back, I did not realize she would need to walk with the oxygen in addition to walking without the oxygen.  I was under the assumption that because she already has oxygen at home we would not have to do the walking part with the oxygen.  Thus, please schedule her for a nurses visit for walking saturations whether it be later this afternoon or sometime next week.  This can be completed either by your self or [name].  I will forward this note to [name] as well just in case she ends up doing the walking saturations.  In case there is any question about how to complete the walking saturations the patient is supposed to have her pulse oxygenation checked while she is resting in the room, and if the oxygenation remains above 90% she is to then walk without oxygen in the hallway, once oxygen saturations dropped below 90% she is to be placed on 2 lpm of oxygen via nasal cannula and then have her oxygen saturations checked while she is walking on the oxygen and then again when she goes back to rest.  The goal is to get her room air saturations to drop below 90% and then improved to over 90% once oxygen is applied.  If you do have any questions please let me know.

## 2020-07-13 NOTE — Progress Notes (Signed)
Subjective:  Patient ID: Abigail Gill, female    DOB: 07-01-1961  Age: 59 y.o. MRN: [id number]  CC:  Chief Complaint  Patient presents with  . Urinary Frequency    itching and urine odor, recently getting over pneumonia      HPI  This patient arrives today for the above.  She tells me she has been experiencing urinary frequency, burning with urination, and some vaginal itching for the last 3 days.  She tells me she has had a UTI in the past and feels that she has been today.  She denies any visible blood in her urine, nausea, vomiting, diarrhea, she has had some fever at home but she recently was also diagnosed with pneumonia and she is still recovering from that.  She tells me she think she is also developing some thrush in her mouth and would like for this to be evaluated.  She is also requesting flu shot to be administered today.  She is up-to-date with pneumonia vaccine.  Additionally, she does have a history of COPD and tells me that she would like to have portable oxygen concentrator because she has been noticing she feels more short of breath with activity.  She currently does have oxygen concentrator at home but would like portable system if possible.  Past Medical History:  Diagnosis Date  . Asthma   . Back pain   . COPD (chronic obstructive pulmonary disease) (HCC)   . GERD (gastroesophageal reflux disease)   . Osteoporosis   . Shortness of breath    with exertion  . Vitamin D deficiency       Family History  Problem Relation Age of Onset  . Alcohol abuse Mother   . Arthritis Mother   . COPD Mother   . Hyperlipidemia Mother   . Pancreatitis Mother   . Heart failure Father   . Alcohol abuse Father   . Arthritis Father   . Diabetes Father   . Heart disease Father        several heart attacks  . Hyperlipidemia Father   . Hypertension Father   . Kidney disease Father   . Stroke Father        several strokes  . COPD Sister   . Hypertension Sister   .  Anesthesia problems Neg Hx   . Hypotension Neg Hx   . Malignant hyperthermia Neg Hx   . 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   1 chihuahua.   Social History   Tobacco Use  . Smoking status: Former Smoker    Packs/day: 1.00    Years: 30.00    Pack years: 30.00    Types: Cigarettes    Start date: 09/16/1976    Quit date: 04/18/2009    Years since quitting: 11.2  . Smokeless tobacco: Never Used  Substance Use Topics  . Alcohol use: No    Alcohol/week: 0.0 standard drinks     Current Meds  Medication Sig  . albuterol (PROVENTIL HFA;VENTOLIN HFA) 108 (90 BASE) MCG/ACT inhaler Inhale 2 puffs into the lungs every 4 (four) hours as needed. For shortness of breath   . alendronate (FOSAMAX) 10 MG tablet Take 1 tablet (10 mg total) by mouth daily before breakfast. Take with a full glass of water on an empty  stomach.  . cetirizine (ZYRTEC) 10 MG tablet Take 10 mg by mouth at bedtime.    . cholecalciferol (VITAMIN D3) 25 MCG (1000 UT) tablet Take 10,000 Units by mouth daily.   . Fluticasone-Salmeterol (ADVAIR) 250-50 MCG/DOSE AEPB Inhale 1 puff into the lungs every 12 (twelve) hours.  Marland Kitchen ibuprofen (ADVIL) 800 MG tablet Take 1 tablet (800 mg total) by mouth every 8 (eight) hours as needed.  Marland Kitchen oxyCODONE-acetaminophen (PERCOCET) 7.5-325 MG tablet Take 1 tablet by mouth every 4 (four) hours as needed for severe pain.  . pregabalin (LYRICA) 75 MG capsule Take 75 mg by mouth 3 (three) times daily.  Marland Kitchen tiZANidine (ZANAFLEX) 2 MG tablet Take 2 mg by mouth every 8 (eight) hours as needed.    ROS:  See HPI   Objective:   Today's Vitals: BP 122/72   Pulse 70   Temp 97.7 F (36.5 C) (Temporal)   Ht 5\' 2"  (1.575 m)   Wt 161 lb (73 kg)   SpO2 95%   BMI 29.45 kg/m  Vitals with BMI 07/13/2020 04/24/2020 02/11/2020  Height 5\' 2"  5\' 2"  5\' 2"   Weight 161  lbs 163 lbs 10 oz 161 lbs  BMI 29.44 29.92 29.44  Systolic 122 120 02/13/2020  Diastolic 72 80 74  Pulse 70 70 96     Physical Exam Vitals reviewed.  Constitutional:      General: She is not in acute distress.    Appearance: Normal appearance.  HENT:     Head: Normocephalic and atraumatic.     Mouth/Throat:     Mouth: Mucous membranes are moist.     Tongue: Lesions (white film) present.  Neck:     Vascular: No carotid bruit.  Cardiovascular:     Rate and Rhythm: Normal rate and regular rhythm.     Pulses: Normal pulses.     Heart sounds: Normal heart sounds.  Pulmonary:     Effort: Pulmonary effort is normal.     Breath sounds: Normal breath sounds.  Abdominal:     Tenderness: There is no right CVA tenderness or left CVA tenderness.  Skin:    General: Skin is warm and dry.  Neurological:     General: No focal deficit present.     Mental Status: She is alert and oriented to person, place, and time.  Psychiatric:        Mood and Affect: Mood normal.        Behavior: Behavior normal.        Judgment: Judgment normal.     Walking oxygen saturations were completed today and did show that on room air at rest her oxygen saturations were 94%, but upon walking her oxygen saturations did drop to 88%.      Assessment and Plan   1. Thrush   2. Cystitis   3. Panlobular emphysema (HCC)   4. Needs flu shot      Plan: 1.  We will prescribe nystatin suspension that she can swish and spit up to 4 times daily to treat her thrush. 2.  We will send urine off for culture, point-of-care UA did show possible signs of UTI.  Will treat empirically with Bactrim, and will prescribe Diflucan as she may have a yeast infection in addition to her urinary tract infection. 3.  Walking sats did show oxygen dropped to 88%, will see if her home health agency can add portable oxygen concentrator. 4.  We will administer flu shot.   Tests ordered Orders Placed  This Encounter  Procedures  . For home  use only DME oxygen  . Flu vaccine HIGH DOSE PF (Fluzone High dose)  . Urinalysis with Culture Reflex  . POC Urinalysis Dipstick      Meds ordered this encounter  Medications  . nystatin (MYCOSTATIN) 100000 UNIT/ML suspension    Sig: Take 5 mLs (500,000 Units total) by mouth 4 (four) times daily.    Dispense:  60 mL    Refill:  0    Order Specific Question:   Supervising Provider    Answer:   Karilyn Cota, NIMISH C [[id number]]  . fluconazole (DIFLUCAN) 150 MG tablet    Sig: Take 1 tablet (150 mg total) by mouth once for 1 dose.    Dispense:  1 tablet    Refill:  0    Order Specific Question:   Supervising Provider    Answer:   Karilyn Cota, NIMISH C [[id number]]  . sulfamethoxazole-trimethoprim (BACTRIM DS) 800-160 MG tablet    Sig: Take 1 tablet by mouth 2 (two) times daily.    Dispense:  10 tablet    Refill:  0    Order Specific Question:   Supervising Provider    Answer:   Wilson Singer [[id number]]    Patient to follow-up as scheduled in approximately 2 weeks  Elenore Paddy, NP

## 2020-07-14 NOTE — Telephone Encounter (Signed)
Return call to patient on 07/13/2020 in office. Pt stated she has already came back for Sarah NP to do O2 fit test.

## 2020-07-15 LAB — URINALYSIS W MICROSCOPIC + REFLEX CULTURE
Bacteria, UA: NONE SEEN /HPF
Bilirubin Urine: NEGATIVE
Glucose, UA: NEGATIVE
Hgb urine dipstick: NEGATIVE
Hyaline Cast: NONE SEEN /LPF
Ketones, ur: NEGATIVE
Nitrites, Initial: NEGATIVE
Protein, ur: NEGATIVE
RBC / HPF: NONE SEEN /HPF (ref 0–2)
Specific Gravity, Urine: 1.01 (ref 1.001–1.03)
pH: 5.5 (ref 5.0–8.0)

## 2020-07-15 LAB — URINE CULTURE: Result:: NO GROWTH

## 2020-07-15 LAB — CULTURE INDICATED

## 2020-07-15 NOTE — Telephone Encounter (Signed)
Walking with oxygen was not completed. Apparently insurance needs her to walk on RA and on oxygen. So the walking sats need to be repeated and completed correctly.

## 2020-07-17 NOTE — Telephone Encounter (Signed)
Called patient and scheduled her for a nurse visit for 07/18/2020 at 11am per patient to do walking sats. Patient verbalized an understanding.

## 2020-07-18 ENCOUNTER — Other Ambulatory Visit: Payer: Self-pay

## 2020-07-18 ENCOUNTER — Ambulatory Visit (INDEPENDENT_AMBULATORY_CARE_PROVIDER_SITE_OTHER): Payer: Medicare Other | Admitting: Nurse Practitioner

## 2020-07-18 DIAGNOSIS — J431 Panlobular emphysema: Secondary | ICD-10-CM

## 2020-07-18 DIAGNOSIS — J9610 Chronic respiratory failure, unspecified whether with hypoxia or hypercapnia: Secondary | ICD-10-CM

## 2020-07-18 NOTE — Progress Notes (Signed)
Patient came in for Oxygen Saturation rates test today.  1) Resting Oxygen saturation on room air when sitting in the room was 91%  2) Walking up and down the hallway on room air Oxygen saturation was 87%  3) Walking up and down the hall with Oxygen via nasal canula and O2 set at 2lpm, patient was at 92%  Patient tolerated the Oxygen Saturation test well.

## 2020-07-19 DIAGNOSIS — J9610 Chronic respiratory failure, unspecified whether with hypoxia or hypercapnia: Secondary | ICD-10-CM | POA: Diagnosis not present

## 2020-07-19 DIAGNOSIS — J449 Chronic obstructive pulmonary disease, unspecified: Secondary | ICD-10-CM | POA: Diagnosis not present

## 2020-07-25 ENCOUNTER — Encounter (INDEPENDENT_AMBULATORY_CARE_PROVIDER_SITE_OTHER): Payer: Self-pay | Admitting: Nurse Practitioner

## 2020-07-25 ENCOUNTER — Other Ambulatory Visit: Payer: Self-pay

## 2020-07-25 ENCOUNTER — Ambulatory Visit (INDEPENDENT_AMBULATORY_CARE_PROVIDER_SITE_OTHER): Payer: Medicare Other | Admitting: Nurse Practitioner

## 2020-07-25 VITALS — BP 132/82 | HR 77 | Temp 97.3°F | Ht 62.0 in | Wt 163.2 lb

## 2020-07-25 DIAGNOSIS — M546 Pain in thoracic spine: Secondary | ICD-10-CM | POA: Diagnosis not present

## 2020-07-25 DIAGNOSIS — M810 Age-related osteoporosis without current pathological fracture: Secondary | ICD-10-CM

## 2020-07-25 DIAGNOSIS — M545 Low back pain, unspecified: Secondary | ICD-10-CM | POA: Diagnosis not present

## 2020-07-25 DIAGNOSIS — E559 Vitamin D deficiency, unspecified: Secondary | ICD-10-CM | POA: Diagnosis not present

## 2020-07-25 DIAGNOSIS — J9610 Chronic respiratory failure, unspecified whether with hypoxia or hypercapnia: Secondary | ICD-10-CM

## 2020-07-25 NOTE — Progress Notes (Signed)
Subjective:  Patient ID: Abigail Gill, female    DOB: Oct 07, 1960  Age: 59 y.o. MRN: [id number]  CC:  Chief Complaint  Patient presents with  . Follow-up    patient states she has had severe back pain for 3 days, middle to lower back and more right sided, wants an xray  . Back Pain  . Other    Vitamin D deficiency  . COPD  . Osteoporosis      HPI  This patient arrives today for the above.  Back pain/Osteoporosis: She has an acute complaint today of mid-low back pain.  She tells me that it started approximately 3 days ago but she does not note or remember any traumatic triggering event.  She denies any sensory changes to her legs or weakness to her legs.  She denies any shortness of breath, coughing, new incontinence of bowels or bladder, or fever.  She does have a history of osteoporosis she is on alendronate daily she is overdue for repeat DEXA scan.  She does have a history of recurrent back pain and has been evaluated by neurosurgery in the past as well as treated for this.  She would like x-ray today for further evaluation of her pain.  Vitamin D deficiency: She continues on 10,000 IUs of vitamin D3 daily.  Last serum check was 39.  COPD: She continues on albuterol as needed as well as Zyrtec, and Advair.  She has come in and down walking sats recently to qualify her for portable oxygen concentrator however she tells me she has not heard back from the home health agency to have this delivered to her.  She is up-to-date with flu, Covid, and pneumonia vaccines.  She did have pneumonia recently and continues to recover from that.    Past Medical History:  Diagnosis Date  . Asthma   . Back pain   . COPD (chronic obstructive pulmonary disease) (HCC)   . GERD (gastroesophageal reflux disease)   . Osteoporosis   . Shortness of breath    with exertion  . Vitamin D deficiency       Family History  Problem Relation Age of Onset  . Alcohol abuse Mother   . Arthritis  Mother   . COPD Mother   . Hyperlipidemia Mother   . Pancreatitis Mother   . Heart failure Father   . Alcohol abuse Father   . Arthritis Father   . Diabetes Father   . Heart disease Father        several heart attacks  . Hyperlipidemia Father   . Hypertension Father   . Kidney disease Father   . Stroke Father        several strokes  . COPD Sister   . Hypertension Sister   . Anesthesia problems Neg Hx   . Hypotension Neg Hx   . Malignant hyperthermia Neg Hx   . 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   1 chihuahua.   Social History   Tobacco Use  . Smoking status: Former Smoker    Packs/day: 1.00    Years: 30.00    Pack years: 30.00    Types: Cigarettes    Start date: 09/16/1976    Quit date: 04/18/2009    Years since quitting: 11.2  . Smokeless tobacco: Never  Used  Substance Use Topics  . Alcohol use: No    Alcohol/week: 0.0 standard drinks     Current Meds  Medication Sig  . albuterol (PROVENTIL HFA;VENTOLIN HFA) 108 (90 BASE) MCG/ACT inhaler Inhale 2 puffs into the lungs every 4 (four) hours as needed. For shortness of breath   . alendronate (FOSAMAX) 10 MG tablet Take 1 tablet (10 mg total) by mouth daily before breakfast. Take with a full glass of water on an empty stomach.  . cetirizine (ZYRTEC) 10 MG tablet Take 10 mg by mouth at bedtime.    . cholecalciferol (VITAMIN D3) 25 MCG (1000 UT) tablet Take 10,000 Units by mouth daily.   . Fluticasone-Salmeterol (ADVAIR) 250-50 MCG/DOSE AEPB Inhale 1 puff into the lungs every 12 (twelve) hours.  Marland Kitchen ibuprofen (ADVIL) 800 MG tablet Take 1 tablet (800 mg total) by mouth every 8 (eight) hours as needed.  . nystatin (MYCOSTATIN) 100000 UNIT/ML suspension Take 5 mLs (500,000 Units total) by mouth 4 (four) times daily.  Marland Kitchen oxyCODONE-acetaminophen (PERCOCET) 7.5-325 MG tablet Take 1  tablet by mouth every 4 (four) hours as needed for severe pain.  . pregabalin (LYRICA) 75 MG capsule Take 75 mg by mouth 3 (three) times daily.  Marland Kitchen tiZANidine (ZANAFLEX) 2 MG tablet Take 2 mg by mouth every 8 (eight) hours as needed.    ROS:  See HPI   Objective:   Today's Vitals: BP 132/82   Pulse 77   Temp (!) 97.3 F (36.3 C) (Temporal)   Ht 5\' 2"  (1.575 m)   Wt 163 lb 3.2 oz (74 kg)   SpO2 93%   BMI 29.85 kg/m  Vitals with BMI 07/25/2020 07/13/2020 04/24/2020  Height 5\' 2"  5\' 2"  5\' 2"   Weight 163 lbs 3 oz 161 lbs 163 lbs 10 oz  BMI 29.84 29.44 29.92  Systolic 132 122 06/24/2020  Diastolic 82 72 80  Pulse 77 70 70     Physical Exam Vitals reviewed.  Constitutional:      General: She is not in acute distress.    Appearance: Normal appearance.  HENT:     Head: Normocephalic and atraumatic.  Neck:     Vascular: No carotid bruit.  Cardiovascular:     Rate and Rhythm: Normal rate and regular rhythm.     Pulses: Normal pulses.     Heart sounds: Normal heart sounds.  Pulmonary:     Effort: Pulmonary effort is normal.     Breath sounds: Normal breath sounds.  Musculoskeletal:     Cervical back: No bony tenderness.     Thoracic back: No bony tenderness.     Lumbar back: No bony tenderness.  Skin:    General: Skin is warm and dry.  Neurological:     General: No focal deficit present.     Mental Status: She is alert and oriented to person, place, and time.     Gait: Gait is intact.  Psychiatric:        Mood and Affect: Mood normal.        Behavior: Behavior normal.        Judgment: Judgment normal.          Assessment and Plan   1. Acute right-sided thoracic back pain   2. Acute right-sided low back pain without sciatica   3. Chronic respiratory failure, unspecified whether with hypoxia or hypercapnia (HCC)   4. Vitamin D deficiency   5. Osteoporosis, unspecified osteoporosis type, unspecified pathological fracture presence  Plan: 1.,  2.,  5.  We will  order x-ray of back for further evaluation especially in light of the fact the patient has osteoporosis and is at higher risk for fracture.  Further recommendations we made based upon those results. 3.  Patient continue on current medications as prescribed and I will contact home health agency to see if we get the portable oxygen concentrator reordered. 4.  She will continue on her vitamin D3 supplement.   Tests ordered Orders Placed This Encounter  Procedures  . DG Thoracic Spine 2 View  . DG Lumbar Spine Complete      No orders of the defined types were placed in this encounter.   Patient to follow-up in 3 months or sooner as needed.  Elenore Paddy, NP

## 2020-07-26 ENCOUNTER — Telehealth (INDEPENDENT_AMBULATORY_CARE_PROVIDER_SITE_OTHER): Payer: Self-pay

## 2020-07-26 ENCOUNTER — Telehealth (INDEPENDENT_AMBULATORY_CARE_PROVIDER_SITE_OTHER): Payer: Self-pay | Admitting: Nurse Practitioner

## 2020-07-26 ENCOUNTER — Other Ambulatory Visit (INDEPENDENT_AMBULATORY_CARE_PROVIDER_SITE_OTHER): Payer: Self-pay | Admitting: Nurse Practitioner

## 2020-07-26 DIAGNOSIS — S22000A Wedge compression fracture of unspecified thoracic vertebra, initial encounter for closed fracture: Secondary | ICD-10-CM

## 2020-07-26 NOTE — Telephone Encounter (Addendum)
Just FYI, I did reach out to this patient as I was able to see patient's x-rays from yesterday in care everywhere.  No acute changes to lumbar spine however possibly new compression fracture noted to patient's thoracic spine.  Will order further imaging.  MRI was recommended, however upon discussion with patient she has had metal plate and screws placed to her left wrist for repair of fracture of wrist volar this year.  Thus, will order CT scan of thoracic spine and set of MRI.  We will also place order to neurosurgery. Patient would like to see Dr. Jordan Likes of [facility] as she has seen them in the past for her back pain.  [name], I did order CT scan of thoracic spine for further eval and placed order for the referral to Dr. Jordan Likes with neurosurgery. Please make sure these orders are completed. Thank you.

## 2020-07-26 NOTE — Telephone Encounter (Signed)
Patient called and is asking if Maralyn Sago received and reviewed her xray results.  Please advise.

## 2020-07-26 NOTE — Telephone Encounter (Signed)
Yes, sent over referral and note in proficient. Having CT order copy to get PA for imaging to collect now pending., TBA.

## 2020-07-31 DIAGNOSIS — J449 Chronic obstructive pulmonary disease, unspecified: Secondary | ICD-10-CM | POA: Diagnosis not present

## 2020-08-07 DIAGNOSIS — S22000A Wedge compression fracture of unspecified thoracic vertebra, initial encounter for closed fracture: Secondary | ICD-10-CM | POA: Insufficient documentation

## 2020-08-14 ENCOUNTER — Encounter (INDEPENDENT_AMBULATORY_CARE_PROVIDER_SITE_OTHER): Payer: Self-pay | Admitting: Nurse Practitioner

## 2020-08-15 ENCOUNTER — Telehealth (INDEPENDENT_AMBULATORY_CARE_PROVIDER_SITE_OTHER): Payer: Self-pay | Admitting: Nurse Practitioner

## 2020-08-15 ENCOUNTER — Other Ambulatory Visit: Payer: Self-pay

## 2020-08-15 ENCOUNTER — Encounter (INDEPENDENT_AMBULATORY_CARE_PROVIDER_SITE_OTHER): Payer: Self-pay | Admitting: Nurse Practitioner

## 2020-08-15 ENCOUNTER — Ambulatory Visit (INDEPENDENT_AMBULATORY_CARE_PROVIDER_SITE_OTHER): Payer: Medicare Other | Admitting: Nurse Practitioner

## 2020-08-15 VITALS — BP 136/88 | HR 93 | Temp 96.9°F | Ht 62.0 in | Wt 162.8 lb

## 2020-08-15 DIAGNOSIS — Z79891 Long term (current) use of opiate analgesic: Secondary | ICD-10-CM | POA: Diagnosis not present

## 2020-08-15 DIAGNOSIS — R109 Unspecified abdominal pain: Secondary | ICD-10-CM | POA: Diagnosis not present

## 2020-08-15 DIAGNOSIS — J439 Emphysema, unspecified: Secondary | ICD-10-CM | POA: Diagnosis not present

## 2020-08-15 DIAGNOSIS — G90512 Complex regional pain syndrome I of left upper limb: Secondary | ICD-10-CM | POA: Diagnosis not present

## 2020-08-15 DIAGNOSIS — R110 Nausea: Secondary | ICD-10-CM

## 2020-08-15 MED ORDER — FLUTICASONE-SALMETEROL 250-50 MCG/DOSE IN AEPB: 1.0000 | INHALATION_SPRAY | Freq: Two times a day (BID) | RESPIRATORY_TRACT | 3 refills | Status: DC

## 2020-08-15 MED ORDER — ONDANSETRON HCL 4 MG PO TABS: 4.0000 mg | ORAL_TABLET | Freq: Three times a day (TID) | ORAL | 1 refills | Status: DC | PRN

## 2020-08-15 NOTE — Telephone Encounter (Signed)
Abdominal ultrasound ordered for this patient today. Please make sure that this is scheduled. Thank you.

## 2020-08-15 NOTE — Progress Notes (Signed)
Subjective:  Patient ID: Abigail Gill, female    DOB: December 18, 1960  Age: 59 y.o. MRN: [id number]  CC:  Chief Complaint  Patient presents with  . Abdominal Pain    Bilateral rib pain, right side is worse than left, hard to breath at times, nausous, changes in bowel movements, worse when she eats everytime she eats      HPI  This patient arrives today for the above.  She tells me for the last 6 weeks has been having right upper quadrant and left upper quadrant abdominal pain as well as below some rib cage pain.  She tells me when the pain occurs it feels like a "knot" and can be moderate to severe in intensity.  Nothing seems to make the pain better it just eventually subsides on its own.  She tells me anytime she eats anything the pain is triggered.  She denies any vomiting but has been experiencing some intermittent nausea.  She is also been having intermittent diarrhea and constipation.  Past Medical History:  Diagnosis Date  . Asthma   . Back pain   . COPD (chronic obstructive pulmonary disease) ([place])   . GERD (gastroesophageal reflux disease)   . Osteoporosis   . Shortness of breath    with exertion  . Vitamin D deficiency       Family History  Problem Relation Age of Onset  . Alcohol abuse Mother   . Arthritis Mother   . COPD Mother   . Hyperlipidemia Mother   . Pancreatitis Mother   . Heart failure Father   . Alcohol abuse Father   . Arthritis Father   . Diabetes Father   . Heart disease Father        several heart attacks  . Hyperlipidemia Father   . Hypertension Father   . Kidney disease Father   . Stroke Father        several strokes  . COPD Sister   . Hypertension Sister   . Anesthesia problems Neg Hx   . Hypotension Neg Hx   . Malignant hyperthermia Neg Hx   . Pseudochol deficiency Neg Hx     Social History   Social History Narrative   Disabled due to COPD   Was a Occupational psychologist   Lives at home with  [place] .Married for 19 years.     1 chihuahua.   Social History   Tobacco Use  . Smoking status: Former Smoker    Packs/day: 1.00    Years: 30.00    Pack years: 30.00    Types: Cigarettes    Start date: 09/16/1976    Quit date: 04/18/2009    Years since quitting: 11.3  . Smokeless tobacco: Never Used  Substance Use Topics  . Alcohol use: No    Alcohol/week: 0.0 standard drinks     Current Meds  Medication Sig  . albuterol (PROVENTIL HFA;VENTOLIN HFA) 108 (90 BASE) MCG/ACT inhaler Inhale 2 puffs into the lungs every 4 (four) hours as needed. For shortness of breath   . alendronate (FOSAMAX) 10 MG tablet Take 1 tablet (10 mg total) by mouth daily before breakfast. Take with a full glass of water on an empty stomach.  . cetirizine (ZYRTEC) 10 MG tablet Take 10 mg by mouth at bedtime.    . cholecalciferol (VITAMIN D3) 25 MCG (1000 UT) tablet Take 10,000 Units by mouth daily.   . Fluticasone-Salmeterol (ADVAIR) 250-50 MCG/DOSE AEPB Inhale 1 puff into the lungs every  12 (twelve) hours.  Marland Kitchen ibuprofen (ADVIL) 800 MG tablet Take 1 tablet (800 mg total) by mouth every 8 (eight) hours as needed.  . nystatin (MYCOSTATIN) 100000 UNIT/ML suspension Take 5 mLs (500,000 Units total) by mouth 4 (four) times daily.  Marland Kitchen oxyCODONE-acetaminophen (PERCOCET) 7.5-325 MG tablet Take 1 tablet by mouth every 4 (four) hours as needed for severe pain.  . pregabalin (LYRICA) 75 MG capsule Take 75 mg by mouth 3 (three) times daily.  . [DISCONTINUED] Fluticasone-Salmeterol (ADVAIR) 250-50 MCG/DOSE AEPB Inhale 1 puff into the lungs every 12 (twelve) hours.    ROS:  Review of Systems  Constitutional: Negative for chills, fever and malaise/fatigue.  Respiratory: Positive for shortness of breath (stable, chronic).   Cardiovascular: Negative for chest pain.  Gastrointestinal: Positive for abdominal pain, constipation, diarrhea and nausea. Negative for blood in stool, melena and vomiting.  Neurological: Negative for dizziness and headaches.      Objective:   Today's Vitals: BP 136/88   Pulse 93   Temp (!) 96.9 F (36.1 C) (Temporal)   Ht '5\' 2"'  (1.575 m)   Wt 162 lb 12.8 oz (73.8 kg)   SpO2 94%   BMI 29.78 kg/m  Vitals with BMI 08/15/2020 07/25/2020 07/13/2020  Height '5\' 2"'  '5\' 2"'  '5\' 2"'   Weight 162 lbs 13 oz 163 lbs 3 oz 161 lbs  BMI 29.77 58.85 02.77  Systolic 412 878 676  Diastolic 88 82 72  Pulse 93 77 70     Physical Exam Vitals reviewed.  Constitutional:      General: She is not in acute distress.    Appearance: Normal appearance.  HENT:     Head: Normocephalic and atraumatic.  Neck:     Vascular: No carotid bruit.  Cardiovascular:     Rate and Rhythm: Normal rate and regular rhythm.     Pulses: Normal pulses.     Heart sounds: Normal heart sounds.  Pulmonary:     Effort: Pulmonary effort is normal.     Breath sounds: Normal breath sounds.  Abdominal:     General: Bowel sounds are increased. There is distension (mild bloating).     Palpations: Abdomen is soft. There is no hepatomegaly, splenomegaly or mass.     Tenderness: There is abdominal tenderness in the right upper quadrant and left upper quadrant. There is no guarding. Negative signs include Murphy's sign.  Skin:    General: Skin is warm and dry.  Neurological:     General: No focal deficit present.     Mental Status: She is alert and oriented to person, place, and time.  Psychiatric:        Mood and Affect: Mood normal.        Behavior: Behavior normal.        Judgment: Judgment normal.          Assessment and Plan   1. Nausea   2. Pulmonary emphysema, unspecified emphysema type ([place])   3. Abdominal pain, unspecified abdominal location      Plan: 1.,  3.  We will start by collecting some blood work for further evaluation today.  We will also order abdominal ultrasound for further evaluation.  She was told to follow a very easy to digest diet and focus on clear liquids and avoid fatty foods.  She tells me she understands.   She was told if her symptoms become acutely severe or she experiences vomiting without ability to keep food or liquids down, bloody emesis, coffee-ground emesis, blood  in stool, or dark tarry stool that she needs to proceed to the ER.  Rx for as needed zofran sent to pharmacy as well. She tells me she understands. I did discuss the above situation with Dr. Anastasio Champion (my supervising physician) and he is agreeable to the above plan.  2. She is requesting refill of her advair, will send refill to pharmacy as well.   Tests ordered Orders Placed This Encounter  Procedures  . CMP with eGFR([facility])  . Lipase  . CBC with Differential/Platelets      Meds ordered this encounter  Medications  . ondansetron (ZOFRAN) 4 MG tablet    Sig: Take 1 tablet (4 mg total) by mouth every 8 (eight) hours as needed for nausea or vomiting.    Dispense:  20 tablet    Refill:  1    Order Specific Question:   Supervising Provider    Answer:   Hurshel Party C [[id number]]  . Fluticasone-Salmeterol (ADVAIR) 250-50 MCG/DOSE AEPB    Sig: Inhale 1 puff into the lungs every 12 (twelve) hours.    Dispense:  60 each    Refill:  3    Order Specific Question:   Supervising Provider    Answer:   Doree Albee [[id number]]    Patient to follow-up as scheduled or sooner as needed.   Ailene Ards, NP

## 2020-08-15 NOTE — Patient Instructions (Signed)
To schedule an appointment with [facility] for your lab draw visit [email or web address] or Call: [phone]. Or you may go to [facility] as a walk-in. Their [place] location address is [address], [place], [place]. Their hours are Monday-Friday from 7:00AM-12:00PM and 1:00PM-5:00PM.    

## 2020-08-16 LAB — CBC WITH DIFFERENTIAL/PLATELET
Absolute Monocytes: 455 cells/uL (ref 200–950)
Basophils Absolute: 42 cells/uL (ref 0–200)
Basophils Relative: 0.6 %
Eosinophils Absolute: 154 cells/uL (ref 15–500)
Eosinophils Relative: 2.2 %
HCT: 40.4 % (ref 35.0–45.0)
Hemoglobin: 13.5 g/dL (ref 11.7–15.5)
Lymphs Abs: 1526 cells/uL (ref 850–3900)
MCH: 28.9 pg (ref 27.0–33.0)
MCHC: 33.4 g/dL (ref 32.0–36.0)
MCV: 86.5 fL (ref 80.0–100.0)
MPV: 10 fL (ref 7.5–12.5)
Monocytes Relative: 6.5 %
Neutro Abs: 4823 cells/uL (ref 1500–7800)
Neutrophils Relative %: 68.9 %
Platelets: 287 10*3/uL (ref 140–400)
RBC: 4.67 10*6/uL (ref 3.80–5.10)
RDW: 13.6 % (ref 11.0–15.0)
Total Lymphocyte: 21.8 %
WBC: 7 10*3/uL (ref 3.8–10.8)

## 2020-08-16 LAB — COMPLETE METABOLIC PANEL WITH GFR
AG Ratio: 1.8 (calc) (ref 1.0–2.5)
ALT: 14 U/L (ref 6–29)
AST: 11 U/L (ref 10–35)
Albumin: 4.2 g/dL (ref 3.6–5.1)
Alkaline phosphatase (APISO): 111 U/L (ref 37–153)
BUN: 14 mg/dL (ref 7–25)
CO2: 30 mmol/L (ref 20–32)
Calcium: 9.4 mg/dL (ref 8.6–10.4)
Chloride: 105 mmol/L (ref 98–110)
Creat: 0.76 mg/dL (ref 0.50–0.99)
GFR, Est African American: 98 mL/min/{1.73_m2} (ref >=60)
GFR, Est Non African American: 85 mL/min/{1.73_m2} (ref >=60)
Globulin: 2.3 g/dL (calc) (ref 1.9–3.7)
Glucose, Bld: 89 mg/dL (ref 65–99)
Potassium: 4.5 mmol/L (ref 3.5–5.3)
Sodium: 142 mmol/L (ref 135–146)
Total Bilirubin: 0.4 mg/dL (ref 0.2–1.2)
Total Protein: 6.5 g/dL (ref 6.1–8.1)

## 2020-08-16 LAB — LIPASE: Lipase: 39 U/L (ref 7–60)

## 2020-08-16 NOTE — Progress Notes (Signed)
OKAY I READ YOUR MESSAGE AND INSTRUCTIONS. WILL CANCEL THE APPOINTMENT.

## 2020-08-18 ENCOUNTER — Ambulatory Visit (HOSPITAL_COMMUNITY): Payer: Medicare Other

## 2020-08-18 DIAGNOSIS — J9610 Chronic respiratory failure, unspecified whether with hypoxia or hypercapnia: Secondary | ICD-10-CM | POA: Diagnosis not present

## 2020-08-18 DIAGNOSIS — J449 Chronic obstructive pulmonary disease, unspecified: Secondary | ICD-10-CM | POA: Diagnosis not present

## 2020-08-23 ENCOUNTER — Ambulatory Visit (HOSPITAL_COMMUNITY): Payer: Medicare Other

## 2020-08-24 DIAGNOSIS — M546 Pain in thoracic spine: Secondary | ICD-10-CM | POA: Diagnosis not present

## 2020-08-24 DIAGNOSIS — S22000A Wedge compression fracture of unspecified thoracic vertebra, initial encounter for closed fracture: Secondary | ICD-10-CM | POA: Diagnosis not present

## 2020-08-30 DIAGNOSIS — S22060A Wedge compression fracture of T7-T8 vertebra, initial encounter for closed fracture: Secondary | ICD-10-CM | POA: Diagnosis not present

## 2020-08-30 DIAGNOSIS — R030 Elevated blood-pressure reading, without diagnosis of hypertension: Secondary | ICD-10-CM | POA: Diagnosis not present

## 2020-08-30 DIAGNOSIS — J449 Chronic obstructive pulmonary disease, unspecified: Secondary | ICD-10-CM | POA: Diagnosis not present

## 2020-09-04 ENCOUNTER — Telehealth (INDEPENDENT_AMBULATORY_CARE_PROVIDER_SITE_OTHER): Payer: Self-pay | Admitting: Nurse Practitioner

## 2020-09-04 NOTE — Telephone Encounter (Signed)
Called patient and she stated that she had discussed with Maralyn Sago and she stated as of now she does not have any stomach pain and is feeling much better. She will let us know if she needs anything. Sending as Lorain Childes.

## 2020-09-04 NOTE — Telephone Encounter (Signed)
Okay great. I do recall her mentioning this to me now. Thank you for double checking for me.

## 2020-09-04 NOTE — Telephone Encounter (Signed)
Please call this patient and see if she ever got her abdominal ultrasound? I see where I ordered it, but I do not have any results.

## 2020-09-18 DIAGNOSIS — J449 Chronic obstructive pulmonary disease, unspecified: Secondary | ICD-10-CM | POA: Diagnosis not present

## 2020-09-18 DIAGNOSIS — J9610 Chronic respiratory failure, unspecified whether with hypoxia or hypercapnia: Secondary | ICD-10-CM | POA: Diagnosis not present

## 2020-09-20 DIAGNOSIS — S22060A Wedge compression fracture of T7-T8 vertebra, initial encounter for closed fracture: Secondary | ICD-10-CM | POA: Diagnosis not present

## 2020-09-30 DIAGNOSIS — J449 Chronic obstructive pulmonary disease, unspecified: Secondary | ICD-10-CM | POA: Diagnosis not present

## 2020-10-19 DIAGNOSIS — J449 Chronic obstructive pulmonary disease, unspecified: Secondary | ICD-10-CM | POA: Diagnosis not present

## 2020-10-19 DIAGNOSIS — J9610 Chronic respiratory failure, unspecified whether with hypoxia or hypercapnia: Secondary | ICD-10-CM | POA: Diagnosis not present

## 2020-10-25 ENCOUNTER — Telehealth (INDEPENDENT_AMBULATORY_CARE_PROVIDER_SITE_OTHER): Payer: Self-pay | Admitting: Nurse Practitioner

## 2020-10-25 ENCOUNTER — Other Ambulatory Visit: Payer: Self-pay

## 2020-10-25 ENCOUNTER — Encounter (INDEPENDENT_AMBULATORY_CARE_PROVIDER_SITE_OTHER): Payer: Self-pay | Admitting: Nurse Practitioner

## 2020-10-25 ENCOUNTER — Ambulatory Visit (INDEPENDENT_AMBULATORY_CARE_PROVIDER_SITE_OTHER): Payer: Medicare Other | Admitting: Nurse Practitioner

## 2020-10-25 VITALS — BP 138/72 | HR 86 | Temp 97.9°F | Resp 22 | Ht 62.0 in | Wt 159.0 lb

## 2020-10-25 DIAGNOSIS — E559 Vitamin D deficiency, unspecified: Secondary | ICD-10-CM

## 2020-10-25 DIAGNOSIS — J439 Emphysema, unspecified: Secondary | ICD-10-CM | POA: Diagnosis not present

## 2020-10-25 DIAGNOSIS — M810 Age-related osteoporosis without current pathological fracture: Secondary | ICD-10-CM

## 2020-10-25 DIAGNOSIS — R519 Headache, unspecified: Secondary | ICD-10-CM | POA: Diagnosis not present

## 2020-10-25 DIAGNOSIS — R7303 Prediabetes: Secondary | ICD-10-CM

## 2020-10-25 DIAGNOSIS — R918 Other nonspecific abnormal finding of lung field: Secondary | ICD-10-CM

## 2020-10-25 MED ORDER — FLUTICASONE-SALMETEROL 250-50 MCG/DOSE IN AEPB: 1.0000 | INHALATION_SPRAY | Freq: Two times a day (BID) | RESPIRATORY_TRACT | 3 refills | Status: DC

## 2020-10-25 NOTE — Telephone Encounter (Signed)
Please look into getting this patient's DEXA scan and CT scan of the chest without contrast scheduled.  Both of these are already ordered and are in the system.  Patient would prefer to have this scheduled in late spring or early summer if possible.  If you are unable to get this accomplished before you leave this morning, please just send this telephone message back and I will forward this to the next appropriate person to help with this.  Thank you so much.

## 2020-10-25 NOTE — Progress Notes (Signed)
Subjective:  Patient ID: Abigail Gill, female    DOB: 05-Feb-1961  Age: 60 y.o. MRN: [id number]  CC:  Chief Complaint  Patient presents with  . Follow-up    Since PNA   . COPD    Needs advair disk refill.  . Other    Vitamin D deficiency  . Osteoporosis  . Headache      HPI  This patient arrives today for the above.  COPD: She was seen in the emergency department in October for pneumonia.  She tells me she ended up getting the flu shortly after she was diagnosed with pneumonia.  She tells me that she is slowly starting to improve and is feeling a bit better.  She continues on albuterol as needed, Zyrtec, daily Advair (she needs a refill today).  She is up-to-date with flu, COVID-19 vaccines, and pneumonia.  She is due for the COVID-19 booster.  Vitamin D deficiency: She continues on vitamin D3 supplement.  Last serum check was collected approximately 6 months ago and it was 39.  She is on 10,000 IUs of vitamin D3 daily.  Osteoporosis: She continues on alendronate and is due for DEXA scan.  This has been ordered but has not been scheduled.  Lung nodules: She also has a history of lung nodules and is due to have CT scan of the chest, this is been ordered but is not been scheduled.  Headache: She has tells him that she is been experiencing intermittent headache for the last month.  She tells me she is also experiencing dizziness and lightheadedness with a headache.  She rates the pain as 5-6/10 and describes it as aching.  She will take an Aleve and this improves the headache.  She denies any blurred vision, or other neurologic abnormalities such as difficulty speaking, difficulty swallowing, weakness, or sensory changes.  Initially when the headaches were started she is experiencing them every other day, but now she tells me that they occur maybe once a week.  She denies any thunderclap headache.  Past Medical History:  Diagnosis Date  . Asthma   . Back pain   . COPD (chronic  obstructive pulmonary disease) ([place])   . GERD (gastroesophageal reflux disease)   . Osteoporosis   . Shortness of breath    with exertion  . Vitamin D deficiency       Family History  Problem Relation Age of Onset  . Alcohol abuse Mother   . Arthritis Mother   . COPD Mother   . Hyperlipidemia Mother   . Pancreatitis Mother   . Heart failure Father   . Alcohol abuse Father   . Arthritis Father   . Diabetes Father   . Heart disease Father        several heart attacks  . Hyperlipidemia Father   . Hypertension Father   . Kidney disease Father   . Stroke Father        several strokes  . COPD Sister   . Hypertension Sister   . Anesthesia problems Neg Hx   . Hypotension Neg Hx   . Malignant hyperthermia Neg Hx   . 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   1 chihuahua.   Social History   Tobacco Use  . Smoking status: Former Smoker  Packs/day: 1.00    Years: 30.00    Pack years: 30.00    Types: Cigarettes    Start date: 09/16/1976    Quit date: 04/18/2009    Years since quitting: 11.5  . Smokeless tobacco: Never Used  Substance Use Topics  . Alcohol use: No    Alcohol/week: 0.0 standard drinks     Current Meds  Medication Sig  . albuterol (PROVENTIL HFA;VENTOLIN HFA) 108 (90 BASE) MCG/ACT inhaler Inhale 2 puffs into the lungs every 4 (four) hours as needed. For shortness of breath  . alendronate (FOSAMAX) 10 MG tablet Take 1 tablet (10 mg total) by mouth daily before breakfast. Take with a full glass of water on an empty stomach.  . cetirizine (ZYRTEC) 10 MG tablet Take 10 mg by mouth at bedtime.  . cholecalciferol (VITAMIN D3) 25 MCG (1000 UT) tablet Take 10,000 Units by mouth daily.   Marland Kitchen ibuprofen (ADVIL) 800 MG tablet Take 1 tablet (800 mg total) by mouth every 8 (eight) hours as needed.  . nystatin (MYCOSTATIN)  100000 UNIT/ML suspension Take 5 mLs (500,000 Units total) by mouth 4 (four) times daily.  . ondansetron (ZOFRAN) 4 MG tablet Take 1 tablet (4 mg total) by mouth every 8 (eight) hours as needed for nausea or vomiting.  Marland Kitchen oxyCODONE-acetaminophen (PERCOCET) 7.5-325 MG tablet Take 1 tablet by mouth every 4 (four) hours as needed for severe pain.  . pregabalin (LYRICA) 75 MG capsule Take 75 mg by mouth 3 (three) times daily.  . [DISCONTINUED] Fluticasone-Salmeterol (ADVAIR) 250-50 MCG/DOSE AEPB Inhale 1 puff into the lungs every 12 (twelve) hours.    ROS:  Review of Systems  Constitutional: Negative for fever.  HENT: Negative for tinnitus.   Eyes: Negative for blurred vision.  Respiratory: Positive for shortness of breath (with activity). Negative for cough.   Cardiovascular: Negative for chest pain.  Neurological: Positive for dizziness and headaches. Negative for loss of consciousness.     Objective:   Today's Vitals: BP 138/72   Pulse 86   Temp 97.9 F (36.6 C)   Resp (!) 22   Ht '5\' 2"'  (1.575 m)   Wt 159 lb (72.1 kg)   SpO2 90%   BMI 29.08 kg/m  Vitals with BMI 10/25/2020 08/15/2020 07/25/2020  Height '5\' 2"'  '5\' 2"'  '5\' 2"'   Weight 159 lbs 162 lbs 13 oz 163 lbs 3 oz  BMI 29.07 10.25 85.27  Systolic 782 423 536  Diastolic 72 88 82  Pulse 86 93 77     Physical Exam Vitals reviewed.  Constitutional:      General: She is not in acute distress.    Appearance: Normal appearance.  HENT:     Head: Normocephalic and atraumatic.  Neck:     Vascular: No carotid bruit.  Cardiovascular:     Rate and Rhythm: Normal rate and regular rhythm.     Pulses: Normal pulses.     Heart sounds: Normal heart sounds.  Pulmonary:     Effort: Pulmonary effort is normal.     Breath sounds: Normal breath sounds.  Skin:    General: Skin is warm and dry.  Neurological:     General: No focal deficit present.     Mental Status: She is alert and oriented to person, place, and time.  Psychiatric:         Mood and Affect: Mood normal.        Behavior: Behavior normal.        Judgment: Judgment normal.  Assessment and Plan   1. Nonintractable headache, unspecified chronicity pattern, unspecified headache type   2. Pulmonary emphysema, unspecified emphysema type ([place])   3. Prediabetes   4. Vitamin D deficiency   5. Osteoporosis, unspecified osteoporosis type, unspecified pathological fracture presence   6. Lung nodules      Plan: 1.  Low likelihood of GCA, but will check blood work including sed rate and CRP for further evaluation.  I think most likely the headache was related to her being ill with flu and pneumonia as the headache is starting to improve as she is getting to feeling better.  I did tell her that if her headache worsens, occurs more frequently, is not improved with Aleve, etc. then she needs to let me know and we will consider MRI of the brain.  She was told that if she experience any acute symptoms such as difficulty speaking, difficulty swallowing, difficulty walking, weakness, sensory changes that she is to call 9 1 she tells me she understands. 2.  Refill on Advair sent to the pharmacy.  I encouraged her to consider getting the COVID-19 booster. 3.  We will check A1c today. 4.  We will check serum vitamin D level as well as CMP. 5.,  6.  We will see if we can get the DEXA scan and CT scan of her lungs scheduled.   Tests ordered Orders Placed This Encounter  Procedures  . CMP with eGFR([facility])  . CBC with Differential/Platelets  . Sedimentation Rate  . C-reactive Protein  . Hemoglobin A1c  . TSH  . Vitamin D, 25-hydroxy      Meds ordered this encounter  Medications  . Fluticasone-Salmeterol (ADVAIR) 250-50 MCG/DOSE AEPB    Sig: Inhale 1 puff into the lungs every 12 (twelve) hours.    Dispense:  60 each    Refill:  3    Order Specific Question:   Supervising Provider    Answer:   Doree Albee [[id number]]    Patient to follow-up in 1 month  for close monitoring of her headache, or sooner as needed.  Ailene Ards, NP

## 2020-10-25 NOTE — Telephone Encounter (Signed)
Ok I have her scheduled for both on May 25. (no auth needed) Will call with appt info.

## 2020-10-25 NOTE — Telephone Encounter (Signed)
Brandi, please see the earlier message. If this is something you can work on and complete today, do you mind trying to get these imaging tests approved/scheduled? If this process can not be completed today, please send me this message back and I will ask [name] to work on it when she returns. The patient prefers to be scheduled in the summertime. Thus, this is not a very time sensitive concern, but I would like for the prior authorization and scheduling to be completed whenever possible. Thank you.

## 2020-10-25 NOTE — Telephone Encounter (Signed)
Pt aware of appt.

## 2020-10-25 NOTE — Telephone Encounter (Signed)
Thank you so much

## 2020-10-26 LAB — CBC WITH DIFFERENTIAL/PLATELET
Absolute Monocytes: 378 cells/uL (ref 200–950)
Basophils Absolute: 31 cells/uL (ref 0–200)
Basophils Relative: 0.5 %
Eosinophils Absolute: 99 cells/uL (ref 15–500)
Eosinophils Relative: 1.6 %
HCT: 39 % (ref 35.0–45.0)
Hemoglobin: 13.1 g/dL (ref 11.7–15.5)
Lymphs Abs: 1314 cells/uL (ref 850–3900)
MCH: 29 pg (ref 27.0–33.0)
MCHC: 33.6 g/dL (ref 32.0–36.0)
MCV: 86.5 fL (ref 80.0–100.0)
MPV: 10.4 fL (ref 7.5–12.5)
Monocytes Relative: 6.1 %
Neutro Abs: 4377 cells/uL (ref 1500–7800)
Neutrophils Relative %: 70.6 %
Platelets: 222 10*3/uL (ref 140–400)
RBC: 4.51 10*6/uL (ref 3.80–5.10)
RDW: 13 % (ref 11.0–15.0)
Total Lymphocyte: 21.2 %
WBC: 6.2 10*3/uL (ref 3.8–10.8)

## 2020-10-26 LAB — COMPLETE METABOLIC PANEL WITH GFR
AG Ratio: 1.7 (calc) (ref 1.0–2.5)
ALT: 13 U/L (ref 6–29)
AST: 12 U/L (ref 10–35)
Albumin: 4.1 g/dL (ref 3.6–5.1)
Alkaline phosphatase (APISO): 91 U/L (ref 37–153)
BUN: 15 mg/dL (ref 7–25)
CO2: 27 mmol/L (ref 20–32)
Calcium: 9.6 mg/dL (ref 8.6–10.4)
Chloride: 106 mmol/L (ref 98–110)
Creat: 0.65 mg/dL (ref 0.50–0.99)
GFR, Est African American: 111 mL/min/{1.73_m2} (ref >=60)
GFR, Est Non African American: 96 mL/min/{1.73_m2} (ref >=60)
Globulin: 2.4 g/dL (calc) (ref 1.9–3.7)
Glucose, Bld: 90 mg/dL (ref 65–139)
Potassium: 3.7 mmol/L (ref 3.5–5.3)
Sodium: 143 mmol/L (ref 135–146)
Total Bilirubin: 0.5 mg/dL (ref 0.2–1.2)
Total Protein: 6.5 g/dL (ref 6.1–8.1)

## 2020-10-26 LAB — HEMOGLOBIN A1C
Hgb A1c MFr Bld: 5.8 % of total Hgb — ABNORMAL HIGH (ref <5.7)
Mean Plasma Glucose: 120 mg/dL
eAG (mmol/L): 6.6 mmol/L

## 2020-10-26 LAB — SEDIMENTATION RATE: Sed Rate: 17 mm/h (ref 0–30)

## 2020-10-26 LAB — VITAMIN D 25 HYDROXY (VIT D DEFICIENCY, FRACTURES): Vit D, 25-Hydroxy: 37 ng/mL (ref 30–100)

## 2020-10-26 LAB — TSH: TSH: 1.63 mIU/L (ref 0.40–4.50)

## 2020-10-26 LAB — C-REACTIVE PROTEIN: CRP: 3.3 mg/L (ref <8.0)

## 2020-10-31 DIAGNOSIS — J449 Chronic obstructive pulmonary disease, unspecified: Secondary | ICD-10-CM | POA: Diagnosis not present

## 2020-11-02 DIAGNOSIS — G90512 Complex regional pain syndrome I of left upper limb: Secondary | ICD-10-CM | POA: Diagnosis not present

## 2020-11-14 NOTE — Telephone Encounter (Signed)
I noticed that this patient is scheduled for CT scan of the spine, but I wanted her to be scheduled for CT scan of the chest. Will you make the necessary changes? Thank you.

## 2020-11-16 DIAGNOSIS — J449 Chronic obstructive pulmonary disease, unspecified: Secondary | ICD-10-CM | POA: Diagnosis not present

## 2020-11-16 DIAGNOSIS — J9610 Chronic respiratory failure, unspecified whether with hypoxia or hypercapnia: Secondary | ICD-10-CM | POA: Diagnosis not present

## 2020-11-22 ENCOUNTER — Ambulatory Visit (INDEPENDENT_AMBULATORY_CARE_PROVIDER_SITE_OTHER): Payer: Medicare Other | Admitting: Nurse Practitioner

## 2020-11-23 ENCOUNTER — Other Ambulatory Visit: Payer: Self-pay

## 2020-11-23 ENCOUNTER — Ambulatory Visit (INDEPENDENT_AMBULATORY_CARE_PROVIDER_SITE_OTHER): Payer: Medicare Other | Admitting: Nurse Practitioner

## 2020-11-23 ENCOUNTER — Encounter (INDEPENDENT_AMBULATORY_CARE_PROVIDER_SITE_OTHER): Payer: Self-pay | Admitting: Nurse Practitioner

## 2020-11-23 VITALS — BP 126/78 | HR 99 | Temp 97.3°F | Ht 62.0 in | Wt 156.8 lb

## 2020-11-23 DIAGNOSIS — M8000XD Age-related osteoporosis with current pathological fracture, unspecified site, subsequent encounter for fracture with routine healing: Secondary | ICD-10-CM

## 2020-11-23 DIAGNOSIS — R000 Tachycardia, unspecified: Secondary | ICD-10-CM

## 2020-11-23 DIAGNOSIS — R519 Headache, unspecified: Secondary | ICD-10-CM

## 2020-11-23 MED ORDER — ALENDRONATE SODIUM 10 MG PO TABS: 10.0000 mg | ORAL_TABLET | Freq: Every day | ORAL | 0 refills | Status: DC

## 2020-11-23 NOTE — Progress Notes (Signed)
Subjective:  Patient ID: Abigail Gill, female    DOB: 05-03-1961  Age: 61 y.o. MRN: [id number]  CC:  Chief Complaint  Patient presents with  . Follow-up    Doing okay, no concerns  . Headache      HPI  This patient arrives today for the above.  At last office visit she had mentioned that she was having headaches.  She is here for close follow-up to make sure the headaches have resolved and have not returned.  She tells me that since have seen her her headaches have essentially gone away.  She tells me she will slowly coming off of Lyrica at the last office visit and believes that this was related to withdrawal from that medication.  Now she feels overall well.  She continues to take alendronate for treatment of her osteoporosis.  She would like a refill on this today.  She is scheduled to have DEXA scan within the next upcoming weeks.  Past Medical History:  Diagnosis Date  . Asthma   . Back pain   . COPD (chronic obstructive pulmonary disease) ([facility])   . GERD (gastroesophageal reflux disease)   . Osteoporosis   . Shortness of breath    with exertion  . Vitamin D deficiency       Family History  Problem Relation Age of Onset  . Alcohol abuse Mother   . Arthritis Mother   . COPD Mother   . Hyperlipidemia Mother   . Pancreatitis Mother   . Heart failure Father   . Alcohol abuse Father   . Arthritis Father   . Diabetes Father   . Heart disease Father        several heart attacks  . Hyperlipidemia Father   . Hypertension Father   . Kidney disease Father   . Stroke Father        several strokes  . COPD Sister   . Hypertension Sister   . Anesthesia problems Neg Hx   . Hypotension Neg Hx   . Malignant hyperthermia Neg Hx   . Pseudochol deficiency Neg Hx     Social History   Social History Narrative   Disabled due to COPD   Was a Associate Professor   Lives at home with  Husband,Abigail Gill .Married for 19 years.   1 chihuahua.   Social History   Tobacco  Use  . Smoking status: Former Smoker    Packs/day: 1.00    Years: 30.00    Pack years: 30.00    Types: Cigarettes    Start date: 09/16/1976    Quit date: 04/18/2009    Years since quitting: 11.6  . Smokeless tobacco: Never Used  Substance Use Topics  . Alcohol use: No    Alcohol/week: 0.0 standard drinks     Current Meds  Medication Sig  . albuterol (PROVENTIL HFA;VENTOLIN HFA) 108 (90 BASE) MCG/ACT inhaler Inhale 2 puffs into the lungs every 4 (four) hours as needed. For shortness of breath  . cetirizine (ZYRTEC) 10 MG tablet Take 10 mg by mouth at bedtime.  . cholecalciferol (VITAMIN D3) 25 MCG (1000 UT) tablet Take 10,000 Units by mouth daily.   . Fluticasone-Salmeterol (ADVAIR) 250-50 MCG/DOSE AEPB Inhale 1 puff into the lungs every 12 (twelve) hours.  Marland Kitchen ibuprofen (ADVIL) 800 MG tablet Take 1 tablet (800 mg total) by mouth every 8 (eight) hours as needed.  . nystatin (MYCOSTATIN) 100000 UNIT/ML suspension Take 5 mLs (500,000 Units total) by mouth 4 (  four) times daily.  . ondansetron (ZOFRAN) 4 MG tablet Take 1 tablet (4 mg total) by mouth every 8 (eight) hours as needed for nausea or vomiting.  Marland Kitchen oxyCODONE-acetaminophen (PERCOCET) 7.5-325 MG tablet Take 1 tablet by mouth every 4 (four) hours as needed for severe pain.  Marland Kitchen tiZANidine (ZANAFLEX) 2 MG tablet Take 2 mg by mouth as needed.  . [DISCONTINUED] alendronate (FOSAMAX) 10 MG tablet Take 1 tablet (10 mg total) by mouth daily before breakfast. Take with a full glass of water on an empty stomach.    ROS:  Review of Systems  Constitutional: Negative for fever.  Eyes: Negative for blurred vision.  Respiratory: Positive for shortness of breath.   Cardiovascular: Negative for chest pain and palpitations.  Neurological: Negative for headaches.     Objective:   Today's Vitals: BP 126/78   Pulse 99   Temp (!) 97.3 F (36.3 C) (Temporal)   Ht 5\' 2"  (1.575 m)   Wt 156 lb 12.8 oz (71.1 kg)   SpO2 95%   BMI 28.68 kg/m  Vitals  with BMI 11/23/2020 11/23/2020 10/25/2020  Height - 5\' 2"  5\' 2"   Weight - 156 lbs 13 oz 159 lbs  BMI - 28.67 29.07  Systolic - 126 138  Diastolic - 78 72  Pulse 99 110 86     Physical Exam Vitals reviewed.  Constitutional:      General: She is not in acute distress.    Appearance: Normal appearance.  HENT:     Head: Normocephalic and atraumatic.  Neck:     Vascular: No carotid bruit.  Cardiovascular:     Rate and Rhythm: Regular rhythm. Tachycardia present.     Pulses: Normal pulses.     Heart sounds: Normal heart sounds.  Pulmonary:     Effort: Pulmonary effort is normal.     Breath sounds: Normal breath sounds.  Skin:    General: Skin is warm and dry.  Neurological:     General: No focal deficit present.     Mental Status: She is alert and oriented to person, place, and time.  Psychiatric:        Mood and Affect: Mood normal.        Behavior: Behavior normal.        Judgment: Judgment normal.          Assessment and Plan   1. Intractable headache, unspecified chronicity pattern, unspecified headache type   2. Osteoporosis with current pathological fracture with routine healing, unspecified osteoporosis type, subsequent encounter   3. Tachycardia      Plan: 1.  Seems to have resolved.  No further work-up recommended today. 2.  We will refer her alendronate and patient will have DEXA scan as scheduled. 3.  Heart rate was a bit elevated upon rooming, but it did improve upon recheck.  She did mention she had some Pepsi at lunchtime which does have a bit of caffeine in it.  She does not have any chest pain or sensation of palpitations.  Heart rhythm appeared to be regular on clinical exam.  Will not perform additional work-up today, but she was told if she experiences any chest pain or sensation of heart fluttering or palpitation that she should notify 12/23/2020 right away.  She tells me she understands.   Tests ordered No orders of the defined types were placed in this  encounter.     Meds ordered this encounter  Medications  . alendronate (FOSAMAX) 10 MG tablet  Sig: Take 1 tablet (10 mg total) by mouth daily before breakfast. Take with a full glass of water on an empty stomach.    Dispense:  90 tablet    Refill:  0    Order Specific Question:   Supervising Provider    Answer:   Wilson Singer [[id number]]    Patient to follow-up in 3 months or sooner as needed.  Elenore Paddy, NP

## 2020-11-28 DIAGNOSIS — J449 Chronic obstructive pulmonary disease, unspecified: Secondary | ICD-10-CM | POA: Diagnosis not present

## 2020-12-17 DIAGNOSIS — J449 Chronic obstructive pulmonary disease, unspecified: Secondary | ICD-10-CM | POA: Diagnosis not present

## 2020-12-17 DIAGNOSIS — J9610 Chronic respiratory failure, unspecified whether with hypoxia or hypercapnia: Secondary | ICD-10-CM | POA: Diagnosis not present

## 2020-12-29 DIAGNOSIS — J449 Chronic obstructive pulmonary disease, unspecified: Secondary | ICD-10-CM | POA: Diagnosis not present

## 2021-01-16 DIAGNOSIS — J449 Chronic obstructive pulmonary disease, unspecified: Secondary | ICD-10-CM | POA: Diagnosis not present

## 2021-01-16 DIAGNOSIS — J9610 Chronic respiratory failure, unspecified whether with hypoxia or hypercapnia: Secondary | ICD-10-CM | POA: Diagnosis not present

## 2021-01-25 DIAGNOSIS — Z79891 Long term (current) use of opiate analgesic: Secondary | ICD-10-CM | POA: Diagnosis not present

## 2021-01-25 DIAGNOSIS — G894 Chronic pain syndrome: Secondary | ICD-10-CM | POA: Diagnosis not present

## 2021-01-25 DIAGNOSIS — G90512 Complex regional pain syndrome I of left upper limb: Secondary | ICD-10-CM | POA: Diagnosis not present

## 2021-01-28 DIAGNOSIS — J449 Chronic obstructive pulmonary disease, unspecified: Secondary | ICD-10-CM | POA: Diagnosis not present

## 2021-02-07 ENCOUNTER — Other Ambulatory Visit: Payer: Self-pay

## 2021-02-07 ENCOUNTER — Ambulatory Visit (HOSPITAL_COMMUNITY)
Admission: RE | Admit: 2021-02-07 | Discharge: 2021-02-07 | Disposition: A | Discharge status: Home / Self Care | Payer: Medicare Other | Source: Ambulatory Visit | Attending: Internal Medicine | Admitting: Internal Medicine

## 2021-02-07 ENCOUNTER — Ambulatory Visit (HOSPITAL_COMMUNITY)
Admission: RE | Admit: 2021-02-07 | Discharge: 2021-02-07 | Disposition: A | Discharge status: Home / Self Care | Payer: Medicare Other | Source: Ambulatory Visit | Attending: Nurse Practitioner | Admitting: Nurse Practitioner

## 2021-02-07 DIAGNOSIS — M810 Age-related osteoporosis without current pathological fracture: Secondary | ICD-10-CM | POA: Diagnosis not present

## 2021-02-07 DIAGNOSIS — E559 Vitamin D deficiency, unspecified: Secondary | ICD-10-CM | POA: Insufficient documentation

## 2021-02-07 DIAGNOSIS — M8000XD Age-related osteoporosis with current pathological fracture, unspecified site, subsequent encounter for fracture with routine healing: Secondary | ICD-10-CM | POA: Diagnosis not present

## 2021-02-07 DIAGNOSIS — Z23 Encounter for immunization: Secondary | ICD-10-CM | POA: Diagnosis not present

## 2021-02-07 DIAGNOSIS — I2510 Atherosclerotic heart disease of native coronary artery without angina pectoris: Secondary | ICD-10-CM | POA: Diagnosis not present

## 2021-02-07 DIAGNOSIS — R918 Other nonspecific abnormal finding of lung field: Secondary | ICD-10-CM | POA: Diagnosis not present

## 2021-02-07 DIAGNOSIS — J439 Emphysema, unspecified: Secondary | ICD-10-CM | POA: Diagnosis not present

## 2021-02-07 DIAGNOSIS — S22000A Wedge compression fracture of unspecified thoracic vertebra, initial encounter for closed fracture: Secondary | ICD-10-CM | POA: Insufficient documentation

## 2021-02-07 DIAGNOSIS — J984 Other disorders of lung: Secondary | ICD-10-CM | POA: Diagnosis not present

## 2021-02-07 DIAGNOSIS — Z780 Asymptomatic menopausal state: Secondary | ICD-10-CM | POA: Diagnosis not present

## 2021-02-07 DIAGNOSIS — I700 Atherosclerosis of aorta: Secondary | ICD-10-CM | POA: Diagnosis not present

## 2021-02-15 ENCOUNTER — Other Ambulatory Visit: Payer: Self-pay

## 2021-02-15 ENCOUNTER — Encounter (INDEPENDENT_AMBULATORY_CARE_PROVIDER_SITE_OTHER): Payer: Self-pay | Admitting: Nurse Practitioner

## 2021-02-15 ENCOUNTER — Ambulatory Visit (INDEPENDENT_AMBULATORY_CARE_PROVIDER_SITE_OTHER): Payer: Medicare Other | Admitting: Nurse Practitioner

## 2021-02-15 VITALS — BP 118/82 | HR 94 | Temp 97.8°F | Ht 62.0 in | Wt 155.6 lb

## 2021-02-15 DIAGNOSIS — I2510 Atherosclerotic heart disease of native coronary artery without angina pectoris: Secondary | ICD-10-CM | POA: Diagnosis not present

## 2021-02-15 DIAGNOSIS — R918 Other nonspecific abnormal finding of lung field: Secondary | ICD-10-CM

## 2021-02-15 DIAGNOSIS — M810 Age-related osteoporosis without current pathological fracture: Secondary | ICD-10-CM | POA: Diagnosis not present

## 2021-02-15 DIAGNOSIS — I700 Atherosclerosis of aorta: Secondary | ICD-10-CM

## 2021-02-15 DIAGNOSIS — E785 Hyperlipidemia, unspecified: Secondary | ICD-10-CM

## 2021-02-15 HISTORY — DX: Hyperlipidemia, unspecified: E78.5

## 2021-02-15 MED ORDER — ATORVASTATIN CALCIUM 10 MG PO TABS: 10.0000 mg | ORAL_TABLET | Freq: Every day | ORAL | 0 refills | Status: DC

## 2021-02-15 NOTE — Progress Notes (Signed)
Subjective:  Patient ID: Abigail Gill, female    DOB: [birth year]  Age: 60 y.o. MRN: [id number]  CC:  Chief Complaint  Patient presents with  . Follow-up    Doing well, discuss test results  . Coronary Artery Disease  . Osteoporosis  . Other    Aortic atherosclerosis, lung nodules  . Hyperlipidemia      HPI  This patient arrives today for the above.  CAD/hyperlipidemia/aortic atherosclerosis: She recently had CT scan of the chest for monitoring of her lung nodules.  Did show aortic atherosclerosis as well as coronary artery disease.  She does not follow with cardiology currently.  She does have a history of mild hyperlipidemia with last LDL around 109.  Tells me she has a family history of heart attacks, but denies any personal history of heart attack or chest pain.  Osteoporosis: She continues on alendronate as well as vitamin D3 supplement.  She had DEXA scan completed last month which showed she continues to have osteoporosis with a T score of -3.4.  Lung nodules: CT scan of lungs showed lung nodules appear to be stable and do not appear to be worrisome for malignancy.  Past Medical History:  Diagnosis Date  . Aortic atherosclerosis (HCC)   . Asthma   . Back pain   . COPD (chronic obstructive pulmonary disease) (HCC)   . GERD (gastroesophageal reflux disease)   . Hyperlipidemia 02/15/2021  . Osteoporosis   . Shortness of breath    with exertion  . Vitamin D deficiency       Family History  Problem Relation Age of Onset  . Alcohol abuse Mother   . Arthritis Mother   . COPD Mother   . Hyperlipidemia Mother   . Pancreatitis Mother   . Heart failure Father   . Alcohol abuse Father   . Arthritis Father   . Diabetes Father   . Heart disease Father        several heart attacks  . Hyperlipidemia Father   . Hypertension Father   . Kidney disease Father   . Stroke Father        several strokes  . COPD Sister   . Hypertension Sister   . Anesthesia problems  Neg Hx   . Hypotension Neg Hx   . Malignant hyperthermia Neg Hx   . 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   1 chihuahua.   Social History   Tobacco Use  . Smoking status: Former Smoker    Packs/day: 1.00    Years: 30.00    Pack years: 30.00    Types: Cigarettes    Start date: 09/16/1976    Quit date: 04/18/2009    Years since quitting: 11.8  . Smokeless tobacco: Never Used  Substance Use Topics  . Alcohol use: No    Alcohol/week: 0.0 standard drinks     Current Meds  Medication Sig  . albuterol (PROVENTIL HFA;VENTOLIN HFA) 108 (90 BASE) MCG/ACT inhaler Inhale 2 puffs into the lungs every 4 (four) hours as needed. For shortness of breath  . alendronate (FOSAMAX) 10 MG tablet Take 1 tablet (10 mg total) by mouth daily before breakfast. Take with a full glass of water on an empty stomach.  Marland Kitchen aspirin 81 MG chewable tablet Chew 1 tablet by  mouth daily.  Marland Kitchen atorvastatin (LIPITOR) 10 MG tablet Take 1 tablet (10 mg total) by mouth at bedtime.  . cetirizine (ZYRTEC) 10 MG tablet Take 10 mg by mouth at bedtime.  . cholecalciferol (VITAMIN D3) 25 MCG (1000 UT) tablet Take 10,000 Units by mouth daily.   . Fluticasone-Salmeterol (ADVAIR) 250-50 MCG/DOSE AEPB Inhale 1 puff into the lungs every 12 (twelve) hours.  Marland Kitchen ibuprofen (ADVIL) 800 MG tablet Take 1 tablet (800 mg total) by mouth every 8 (eight) hours as needed.  . nystatin (MYCOSTATIN) 100000 UNIT/ML suspension Take 5 mLs (500,000 Units total) by mouth 4 (four) times daily.  . ondansetron (ZOFRAN) 4 MG tablet Take 1 tablet (4 mg total) by mouth every 8 (eight) hours as needed for nausea or vomiting.  Marland Kitchen oxyCODONE-acetaminophen (PERCOCET) 7.5-325 MG tablet Take 1 tablet by mouth every 4 (four) hours as needed for severe pain.  Marland Kitchen tiZANidine (ZANAFLEX) 2 MG tablet Take 2 mg by mouth as needed.     ROS:  Review of Systems  Eyes: Negative for blurred vision.  Respiratory: Negative for shortness of breath.   Cardiovascular: Negative for chest pain.  Neurological: Negative for dizziness and headaches.     Objective:   Today's Vitals: BP 118/82   Pulse 94   Temp 97.8 F (36.6 C) (Temporal)   Ht 5\' 2"  (1.575 m)   Wt 155 lb 9.6 oz (70.6 kg)   SpO2 (!) 78%   BMI 28.46 kg/m  Vitals with BMI 02/15/2021 11/23/2020 11/23/2020  Height 5\' 2"  - 5\' 2"   Weight 155 lbs 10 oz - 156 lbs 13 oz  BMI 28.45 - 28.67  Systolic 118 - 126  Diastolic 82 - 78  Pulse 94 99 01/23/2021     Physical Exam Vitals reviewed.  Constitutional:      General: She is not in acute distress.    Appearance: Normal appearance.  HENT:     Head: Normocephalic and atraumatic.  Neck:     Vascular: No carotid bruit.  Cardiovascular:     Rate and Rhythm: Normal rate and regular rhythm.     Pulses: Normal pulses.     Heart sounds: Normal heart sounds.  Pulmonary:     Effort: Pulmonary effort is normal.     Breath sounds: Normal breath sounds.  Skin:    General: Skin is warm and dry.  Neurological:     General: No focal deficit present.     Mental Status: She is alert and oriented to person, place, and time.  Psychiatric:        Mood and Affect: Mood normal.        Behavior: Behavior normal.        Judgment: Judgment normal.          Assessment and Plan   1. Aortic atherosclerosis (HCC)   2. Hyperlipidemia, unspecified hyperlipidemia type   3. Coronary artery disease involving native heart without angina pectoris, unspecified vessel or lesion type   4. Lung nodules   5. Age-related osteoporosis without current pathological fracture      Plan: 1.-3.  Per shared decision making we have opted to start patient on low-dose aspirin therapy as well as atorvastatin 10 mg daily.  I offered to send her to cardiologist for further assistance with managing her risk factors and CAD.  However for now she would  prefer to just start the 2 medications as stated above.  We did discuss risk and benefits of each.  We also discussed  high risk of bleeding especially if she were to have any falls.  We discussed signs and symptoms of bleeding in the gastrointestinal tract.  She tells me she understands.  She will follow-up in about 8 weeks so we can repeat CMP and lipid panel and to determine how she is tolerating the new medications. 4.  Appears stable at this time may consider repeating CT scan at a future date to monitor. 5.  She will continue on current medications for treatment of osteoporosis.   Tests ordered No orders of the defined types were placed in this encounter.     Meds ordered this encounter  Medications  . atorvastatin (LIPITOR) 10 MG tablet    Sig: Take 1 tablet (10 mg total) by mouth at bedtime.    Dispense:  90 tablet    Refill:  0    Order Specific Question:   Supervising Provider    Answer:   Wilson Singer [[id number]]    Patient to follow-up in 6-day weeks or sooner as needed.  Elenore Paddy, NP

## 2021-02-15 NOTE — Patient Instructions (Signed)
Atorvastatin Tablets What is this medicine? ATORVASTATIN (a TORE va sta tin) is a statin. It lowers bad cholesterol and triglyceride levels in the blood. It also increases good cholesterol levels. It is used with lifestyle changes, like diet and exercise. It may be used alone or with other drugs. This medicine may be used for other purposes; ask your health care provider or pharmacist if you have questions. COMMON BRAND NAME(S): Lipitor What should I tell my health care provider before I take this medicine? They need to know if you have any of these conditions:  diabetes (high blood sugar)  if you often drink alcohol  kidney disease  liver disease  muscle cramps, pain  stroke  thyroid disease  an unusual or allergic reaction to atorvastatin, other medicines, foods, dyes, or preservatives  pregnant or trying to get pregnant  breast-feeding How should I use this medicine? Take this medicine by mouth. Take it as directed on the prescription label at the same time every day. You can take it with or without food. If it upsets your stomach, take it with food. Keep taking it unless your health care provider tells you to stop. Do not take this medicine with grapefruit juice. Talk to your health care provider about the use of this medicine in children. While it may be prescribed for children as young as 10 for selected conditions, precautions do apply. Overdosage: If you think you have taken too much of this medicine contact a poison control center or emergency room at once. NOTE: This medicine is only for you. Do not share this medicine with others. What if I miss a dose? If you miss a dose, take it as soon as you can. If it is almost time for your next dose, take only that dose. Do not take double or extra doses. What may interact with this medicine? Do not take this medicine with any of the following medications:  dasabuvir; ombitasvir; paritaprevir; ritonavir  ombitasvir;  paritaprevir; ritonavir  posaconazole  red yeast rice This medicine may also interact with the following medications:  alcohol  birth control pills  certain antibiotics like erythromycin and clarithromycin  certain antivirals for HIV or hepatitis  certain medicines for cholesterol like fenofibrate, gemfibrozil, and niacin  certain medicines for fungal infections like ketoconazole and itraconazole  colchicine  cyclosporine  digoxin  grapefruit juice  rifampin This list may not describe all possible interactions. Give your health care provider a list of all the medicines, herbs, non-prescription drugs, or dietary supplements you use. Also tell them if you smoke, drink alcohol, or use illegal drugs. Some items may interact with your medicine. What should I watch for while using this medicine? Visit your health care provider for regular checks on your progress. Tell your health care provider if your symptoms do not start to get better or if they get worse. Your health care provider may tell you to stop taking this medicine if you develop muscle problems. If your muscle problems do not go away after stopping this medicine, contact your health care provider. Do not become pregnant while taking this medicine. Women should inform their health care provider if they wish to become pregnant or think they might be pregnant. There is potential for serious harm to an unborn child. Talk to your health care provider for more information. Do not breast-feed an infant while taking this medicine. Birth control may not work properly while you are taking this medicine. Talk to your health care provider about using  an extra method of birth control. This medicine may increase blood sugar. Ask your health care provider if changes in diet or medicines are needed if you have diabetes. If you are going to need surgery or other procedure, tell your health care provider that you are using this  medicine. Taking this medicine is only part of a total heart healthy program. Your health care provider may give you a special diet to follow. Avoid alcohol. Avoid smoking. Ask your health care provider how much you should exercise. What side effects may I notice from receiving this medicine? Side effects that you should report to your doctor or health care provider as soon as possible:  allergic reactions (skin rash, itching or hives; swelling of the face, lips, or tongue)  high blood sugar (increased hunger, thirst or urination; unusually weak or tired, blurry vision)  infection (fever, chills, cough, sore throat, pain or trouble passing urine)  joint pain  liver injury (dark yellow or brown urine; general ill feeling or flu-like symptoms; loss of appetite, right upper belly pain; unusually weak or tired, yellowing of the eyes or skin)  muscle injury (dark urine; trouble passing urine or change in the amount of urine; unusually weak or tired; muscle pain; back pain)  redness, blistering, peeling, or loosening of the skin, including inside the mouth Side effects that usually do not require medical attention (report to your doctor or health care provider if they continue or are bothersome):  diarrhea  nausea  upset stomach This list may not describe all possible side effects. Call your doctor for medical advice about side effects. You may report side effects to FDA at 1-800-FDA-1088. Where should I keep my medicine? Keep out of the reach of children and pets. Store at room temperature between 20 and 25 degrees C (68 and 77 degrees F). Get rid of any unused medicine after the expiration date. To get rid of medicines that are no longer needed or have expired:  Take the medicine to a medicine take-back program. Check with your pharmacy or law enforcement to find a location.  If you cannot return the medicine, check the label or package insert to see if the medicine should be thrown out  in the garbage or flushed down the toilet. If you are not sure, ask your health care provider. If it is safe to put it in the trash, take the medicine out of the container. Mix the medicine with cat litter, dirt, coffee grounds, or other unwanted substance. Seal the mixture in a bag or container. Put it in the trash. NOTE: This sheet is a summary. It may not cover all possible information. If you have questions about this medicine, talk to your doctor, pharmacist, or health care provider.  2021 Elsevier/Gold Standard (2020-08-17 12:41:57)     Aspirin and Your Heart Aspirin is a medicine that prevents the platelets in your blood from sticking together. Platelets are the cells that your blood uses for clotting. Aspirin can be used to help reduce the risk of blood clots, heart attacks, and other heart-related problems. What are the risks? Daily use of aspirin can cause side effects. Some of these include:  Bleeding. Bleeding can be minor or serious. An example of minor bleeding is bleeding from a cut, and the bleeding does not stop. An example of more serious bleeding is stomach bleeding or, rarely, bleeding into the brain. Your risk of bleeding increases if you are also taking NSAIDs, such as ibuprofen.  Increased bruising.  Upset stomach.  An allergic reaction. People who have growths inside the nose (nasal polyps) have an increased risk of developing an aspirin allergy. How to use aspirin to care for your heart  Take aspirin only as told by your health care provider. Make sure that you understand how much to take and what form to take. The two forms of aspirin are: ? Non-enteric-coated.This type of aspirin does not have a coating and is absorbed quickly. This type of aspirin also comes in a chewable form. ? Enteric-coated. This type of aspirin has a coating that releases the medicine very slowly. Enteric-coated aspirin might cause less stomach upset than non-enteric-coated aspirin. This type  of aspirin should not be chewed or crushed.  Work with your health care provider to find out whether it is safe and beneficial for you to take aspirin daily. Taking aspirin daily may be helpful if: ? You have had a heart attack or chest pain, or you are at risk for a heart attack. ? You have a condition in which certain heart vessels are blocked (coronary artery disease), and you have had a procedure to treat it. Examples are:  Open-heart surgery, such as coronary artery bypass surgery (CABG).  Coronary angioplasty,which is done to widen a blood vessel of your heart.  Having a small mesh tube, or stent, placed in your coronary artery. ? You have had certain types of stroke or a mini-stroke known as a transient ischemic attack (TIA). ? You have a narrowing of the arteries that supply the limbs (peripheral artery disease, or PAD). ? You have long-term (chronic) heart rhythm problems, such as atrial fibrillation, and your health care provider thinks aspirin may help. ? You have valve disease or have had surgery on a valve. ? You are considered at increased risk of developing coronary artery disease or PAD.   Follow these instructions at home Medicines  Take over-the-counter and prescription medicines only as told by your health care provider.  If you are taking blood thinners: ? Talk with your health care provider before you take any medicines that contain aspirin or NSAIDs, such as ibuprofen. These medicines increase your risk for dangerous bleeding. ? Take your medicine exactly as told, at the same time every day. ? Avoid activities that could cause injury or bruising, and follow instructions about how to prevent falls. ? Wear a medical alert bracelet or carry a card that lists what medicines you take. General instructions  Do not drink alcohol if: ? Your health care provider tells you not to drink. ? You are pregnant, may be pregnant, or are planning to become pregnant.  If you drink  alcohol: ? Limit how much you use to:  0-1 drink a day for women.  0-2 drinks a day for men. ? Be aware of how much alcohol is in your drink. In the [place] one drink equals one 12 oz bottle of beer (355 mL), one 5 oz glass of wine (148 mL), or one 1 oz glass of hard liquor (44 mL).  Keep all follow-up visits as told by your health care provider. This is important. Where to find more information  The American Heart Association: www.heart.org Contact a health care provider if you have:  Unusual bleeding or bruising.  Stomach pain or nausea.  Ringing in your ears.  An allergic reaction that causes hives, itchy skin, or swelling of the lips, tongue, or face. Get help right away if:  You notice that your bowel movements are bloody,  or dark red or black in color.  You vomit or cough up blood.  You have blood in your urine.  You cough, breathe loudly (wheeze), or feel short of breath.  You have chest pain, especially if the pain spreads to your arms, back, neck, or jaw.  You have a headache with confusion. You have any symptoms of a stroke. "BE FAST" is an easy way to remember the main warning signs of a stroke:  B - Balance. Signs are dizziness, sudden trouble walking, or loss of balance.  E - Eyes. Signs are trouble seeing or a sudden change in vision.  F - Face. Signs are sudden weakness or numbness of the face, or the face or eyelid drooping on one side.  A - Arms. Signs are weakness or numbness in an arm. This happens suddenly and usually on one side of the body.  S - Speech. Signs are sudden trouble speaking, slurred speech, or trouble understanding what people say.  T - Time. Time to call emergency services. Write down what time symptoms started. You have other signs of a stroke, such as:  A sudden, severe headache with no known cause.  Nausea or vomiting.  Seizure. These symptoms may represent a serious problem that is an emergency. Do not wait to see if the  symptoms will go away. Get medical help right away. Call your local emergency services (911 in the [place] Do not drive yourself to the hospital. Summary  Aspirin use can help reduce the risk of blood clots, heart attacks, and other heart-related problems.  Daily use of aspirin can cause side effects.  Take aspirin only as told by your health care provider. Make sure that you understand how much to take and what form to take.  Your health care provider will help you determine whether it is safe and beneficial for you to take aspirin daily. This information is not intended to replace advice given to you by your health care provider. Make sure you discuss any questions you have with your health care provider. Document Revised: 06/07/2019 Document Reviewed: 06/07/2019 Elsevier Patient Education  2021 [employer].

## 2021-02-16 DIAGNOSIS — J449 Chronic obstructive pulmonary disease, unspecified: Secondary | ICD-10-CM | POA: Diagnosis not present

## 2021-02-16 DIAGNOSIS — J9610 Chronic respiratory failure, unspecified whether with hypoxia or hypercapnia: Secondary | ICD-10-CM | POA: Diagnosis not present

## 2021-02-20 ENCOUNTER — Other Ambulatory Visit (INDEPENDENT_AMBULATORY_CARE_PROVIDER_SITE_OTHER): Payer: Self-pay | Admitting: Internal Medicine

## 2021-02-20 ENCOUNTER — Telehealth (INDEPENDENT_AMBULATORY_CARE_PROVIDER_SITE_OTHER): Payer: Self-pay

## 2021-02-20 DIAGNOSIS — I2510 Atherosclerotic heart disease of native coronary artery without angina pectoris: Secondary | ICD-10-CM

## 2021-02-20 NOTE — Telephone Encounter (Signed)
Okay, I have put the order in for the referral.  Thanks.

## 2021-02-28 DIAGNOSIS — J449 Chronic obstructive pulmonary disease, unspecified: Secondary | ICD-10-CM | POA: Diagnosis not present

## 2021-03-18 DIAGNOSIS — J449 Chronic obstructive pulmonary disease, unspecified: Secondary | ICD-10-CM | POA: Diagnosis not present

## 2021-03-18 DIAGNOSIS — J9610 Chronic respiratory failure, unspecified whether with hypoxia or hypercapnia: Secondary | ICD-10-CM | POA: Diagnosis not present

## 2021-03-30 DIAGNOSIS — J449 Chronic obstructive pulmonary disease, unspecified: Secondary | ICD-10-CM | POA: Diagnosis not present

## 2021-04-05 ENCOUNTER — Ambulatory Visit (INDEPENDENT_AMBULATORY_CARE_PROVIDER_SITE_OTHER): Payer: Medicare Other | Admitting: Nurse Practitioner

## 2021-04-18 DIAGNOSIS — J9610 Chronic respiratory failure, unspecified whether with hypoxia or hypercapnia: Secondary | ICD-10-CM | POA: Diagnosis not present

## 2021-04-18 DIAGNOSIS — J449 Chronic obstructive pulmonary disease, unspecified: Secondary | ICD-10-CM | POA: Diagnosis not present

## 2021-04-19 ENCOUNTER — Encounter (INDEPENDENT_AMBULATORY_CARE_PROVIDER_SITE_OTHER): Payer: Self-pay

## 2021-04-23 ENCOUNTER — Telehealth (INDEPENDENT_AMBULATORY_CARE_PROVIDER_SITE_OTHER): Payer: Self-pay

## 2021-04-23 DIAGNOSIS — J431 Panlobular emphysema: Secondary | ICD-10-CM

## 2021-04-23 NOTE — Telephone Encounter (Signed)
Called patient and she needs a refill of her Advair and her ProAir Inhalers sent to [facility] in [place]. Patient will have her heart doctor do her labs for her lipids and medication. Patient was given information for new PCP in area. Patient verbalized an understanding.

## 2021-04-24 MED ORDER — FLUTICASONE-SALMETEROL 250-50 MCG/ACT IN AEPB: 1.0000 | INHALATION_SPRAY | Freq: Two times a day (BID) | RESPIRATORY_TRACT | 1 refills | Status: DC

## 2021-04-24 MED ORDER — ALBUTEROL SULFATE HFA 108 (90 BASE) MCG/ACT IN AERS: 2.0000 | INHALATION_SPRAY | RESPIRATORY_TRACT | 2 refills | Status: DC | PRN

## 2021-04-24 NOTE — Telephone Encounter (Signed)
Refills approved and sent to [facility] in [place].

## 2021-04-26 ENCOUNTER — Ambulatory Visit (INDEPENDENT_AMBULATORY_CARE_PROVIDER_SITE_OTHER): Payer: Medicare Other | Admitting: Nurse Practitioner

## 2021-04-27 DIAGNOSIS — G90512 Complex regional pain syndrome I of left upper limb: Secondary | ICD-10-CM | POA: Diagnosis not present

## 2021-04-30 DIAGNOSIS — J449 Chronic obstructive pulmonary disease, unspecified: Secondary | ICD-10-CM | POA: Diagnosis not present

## 2021-05-02 ENCOUNTER — Ambulatory Visit: Payer: Medicare Other | Admitting: Cardiology

## 2021-05-02 ENCOUNTER — Other Ambulatory Visit: Payer: Self-pay

## 2021-05-02 ENCOUNTER — Encounter: Payer: Self-pay | Admitting: *Deleted

## 2021-05-02 ENCOUNTER — Encounter: Payer: Self-pay | Admitting: Cardiology

## 2021-05-02 VITALS — BP 136/80 | HR 69 | Ht 62.0 in | Wt 158.0 lb

## 2021-05-02 DIAGNOSIS — I700 Atherosclerosis of aorta: Secondary | ICD-10-CM | POA: Diagnosis not present

## 2021-05-02 DIAGNOSIS — R0602 Shortness of breath: Secondary | ICD-10-CM

## 2021-05-02 DIAGNOSIS — R0600 Dyspnea, unspecified: Secondary | ICD-10-CM

## 2021-05-02 DIAGNOSIS — I2510 Atherosclerotic heart disease of native coronary artery without angina pectoris: Secondary | ICD-10-CM

## 2021-05-02 DIAGNOSIS — E785 Hyperlipidemia, unspecified: Secondary | ICD-10-CM

## 2021-05-02 DIAGNOSIS — R0609 Other forms of dyspnea: Secondary | ICD-10-CM

## 2021-05-02 MED ORDER — ATORVASTATIN CALCIUM 10 MG PO TABS: 10.0000 mg | ORAL_TABLET | Freq: Every day | ORAL | 3 refills | Status: DC

## 2021-05-02 NOTE — Progress Notes (Signed)
Clinical Summary Abigail Gill is a 60 y.o.female seen today as a new patient. Last seen by our practice in 2017    1. Coronary atherosclerosis - noted by prior CT scan - sharp pain left sided, 2 total episodes last month. Both times occurred at times. Lasted just a few seconds. No other associated symptoms. Chronic SOB she attributes to COPD, she is on home O2, some progression SOB.   CAD risk factors: former smoker x 30 years, prediabetes  Past Medical History:  Diagnosis Date   Aortic atherosclerosis (HCC)    Asthma    Back pain    COPD (chronic obstructive pulmonary disease) (HCC)    GERD (gastroesophageal reflux disease)    Hyperlipidemia 02/15/2021   Osteoporosis    Shortness of breath    with exertion   Vitamin D deficiency      Allergies  Allergen Reactions   Alendronate Sodium Nausea And Vomiting    Vomiting within minutes of taking Vomiting within minutes of taking: Can take with crackers or food and can keep it down   Pregabalin Other (See Comments)    Headaches     Current Outpatient Medications  Medication Sig Dispense Refill   albuterol (VENTOLIN HFA) 108 (90 Base) MCG/ACT inhaler Inhale 2 puffs into the lungs every 4 (four) hours as needed. For shortness of breath 18 g 2   alendronate (FOSAMAX) 10 MG tablet Take 1 tablet (10 mg total) by mouth daily before breakfast. Take with a full glass of water on an empty stomach. 90 tablet 0   aspirin 81 MG chewable tablet Chew 1 tablet by mouth daily.     atorvastatin (LIPITOR) 10 MG tablet Take 1 tablet (10 mg total) by mouth at bedtime. 90 tablet 0   cetirizine (ZYRTEC) 10 MG tablet Take 10 mg by mouth at bedtime.     cholecalciferol (VITAMIN D3) 25 MCG (1000 UT) tablet Take 10,000 Units by mouth daily.      fluticasone-salmeterol (ADVAIR) 250-50 MCG/ACT AEPB Inhale 1 puff into the lungs in the morning and at bedtime. 60 each 1   Fluticasone-Salmeterol (ADVAIR) 250-50 MCG/DOSE AEPB Inhale 1 puff into the lungs  every 12 (twelve) hours. 60 each 3   ibuprofen (ADVIL) 800 MG tablet Take 1 tablet (800 mg total) by mouth every 8 (eight) hours as needed. 30 tablet 0   nystatin (MYCOSTATIN) 100000 UNIT/ML suspension Take 5 mLs (500,000 Units total) by mouth 4 (four) times daily. 60 mL 0   ondansetron (ZOFRAN) 4 MG tablet Take 1 tablet (4 mg total) by mouth every 8 (eight) hours as needed for nausea or vomiting. 20 tablet 1   oxyCODONE-acetaminophen (PERCOCET) 7.5-325 MG tablet Take 1 tablet by mouth every 4 (four) hours as needed for severe pain.     tiZANidine (ZANAFLEX) 2 MG tablet Take 2 mg by mouth as needed.     No current facility-administered medications for this visit.     Past Surgical History:  Procedure Laterality Date   BREAST BIOPSY Left    benign   BREAST ENHANCEMENT SURGERY  1996   bilateral-[place]   BREAST EXCISIONAL BIOPSY Left    benign   BREAST LUMPECTOMY  08/30/2011   Procedure: LUMPECTOMY;  Surgeon: Fabio Bering;  Location: [facility];  Service: General;  Laterality: Left;   KYPHOPLASTY  2018   TUBAL LIGATION  1985   [place]     Allergies  Allergen Reactions   Alendronate Sodium Nausea And Vomiting  Vomiting within minutes of taking Vomiting within minutes of taking: Can take with crackers or food and can keep it down   Pregabalin Other (See Comments)    Headaches      Family History  Problem Relation Age of Onset   Alcohol abuse Mother    Arthritis Mother    COPD Mother    Hyperlipidemia Mother    Pancreatitis Mother    Heart failure Father    Alcohol abuse Father    Arthritis Father    Diabetes Father    Heart disease Father        several heart attacks   Hyperlipidemia Father    Hypertension Father    Kidney disease Father    Stroke Father        several strokes   COPD Sister    Hypertension Sister    Anesthesia problems Neg Hx    Hypotension Neg Hx    Malignant hyperthermia Neg Hx    Pseudochol deficiency Neg Hx      Social  History Abigail Gill reports that she quit smoking about 12 years ago. Her smoking use included cigarettes. She started smoking about 44 years ago. She has a 30.00 pack-year smoking history. She has never used smokeless tobacco. Abigail Gill reports no history of alcohol use.   Review of Systems CONSTITUTIONAL: No weight loss, fever, chills, weakness or fatigue.  HEENT: Eyes: No visual loss, blurred vision, double vision or yellow sclerae.No hearing loss, sneezing, congestion, runny nose or sore throat.  SKIN: No rash or itching.  CARDIOVASCULAR: per hpi RESPIRATORY: per hpi GASTROINTESTINAL: No anorexia, nausea, vomiting or diarrhea. No abdominal pain or blood.  GENITOURINARY: No burning on urination, no polyuria NEUROLOGICAL: No headache, dizziness, syncope, paralysis, ataxia, numbness or tingling in the extremities. No change in bowel or bladder control.  MUSCULOSKELETAL: No muscle, back pain, joint pain or stiffness.  LYMPHATICS: No enlarged nodes. No history of splenectomy.  PSYCHIATRIC: No history of depression or anxiety.  ENDOCRINOLOGIC: No reports of sweating, cold or heat intolerance. No polyuria or polydipsia.  Marland Kitchen   Physical Examination Today's Vitals   05/02/21 0848  BP: 136/80  Pulse: 69  SpO2: 95%  Weight: 158 lb (71.7 kg)  Height: 5\' 2"  (1.575 m)   Body mass index is 28.9 kg/m.  Gen: resting comfortably, no acute distress HEENT: no scleral icterus, pupils equal round and reactive, no palptable cervical adenopathy,  CV: RRR, no m/r/g no jvd Resp: Clear to auscultation bilaterally GI: abdomen is soft, non-tender, non-distended, normal bowel sounds, no hepatosplenomegaly MSK: extremities are warm, no edema.  Skin: warm, no rash Neuro:  no focal deficits Psych: appropriate affect   Diagnostic Studies  04/2015 event monitor Tracings show normal sinus rhythm No symptoms reported No significant arrhythmias   01/2021 CT chest Tracings show normal sinus rhythm No  symptoms reported No significant arrhythmias   Assessment and Plan   1. Coronary atherosclerosis - incidental findings on CT chest, LM and multivessel atherosclerosis - infrequent atypical chest pain. Progressing DOE however that is unclear if solely related to her COPD - not able to run on treadmill due to her COPD, will plan for lexiscan, no active wheezing in clinic - continue aspirin, statin - EKG today shows SR, no acute ischemic changes    02/2021, M.D.

## 2021-05-02 NOTE — Patient Instructions (Addendum)
Medication Instructions:  Continue all current medications.  Labwork: none  Testing/Procedures: Your physician has requested that you have a lexiscan myoview. For further information please visit [email or web address]. Please follow instruction sheet, as given.  Office will contact with results via phone or letter.    Follow-Up: Pending test results   Any Other Special Instructions Will Be Listed Below (If Applicable).  If you need a refill on your cardiac medications before your next appointment, please call your pharmacy.  

## 2021-05-17 ENCOUNTER — Other Ambulatory Visit: Payer: Self-pay

## 2021-05-17 ENCOUNTER — Ambulatory Visit (HOSPITAL_COMMUNITY)
Admission: RE | Admit: 2021-05-17 | Discharge: 2021-05-17 | Disposition: A | Discharge status: Home / Self Care | Payer: Medicare Other | Source: Ambulatory Visit | Attending: Cardiology | Admitting: Cardiology

## 2021-05-17 ENCOUNTER — Encounter (HOSPITAL_COMMUNITY)
Admission: RE | Admit: 2021-05-17 | Discharge: 2021-05-17 | Disposition: A | Discharge status: Home / Self Care | Payer: Medicare Other | Source: Ambulatory Visit | Attending: Cardiology | Admitting: Cardiology

## 2021-05-17 DIAGNOSIS — R0602 Shortness of breath: Secondary | ICD-10-CM | POA: Diagnosis not present

## 2021-05-17 LAB — NM MYOCAR MULTI W/SPECT W/WALL MOTION / EF
LV dias vol: 55 mL (ref 46–106)
LV sys vol: 16 mL
Nuc Stress EF: 71 %
Peak HR: 123 {beats}/min
RATE: 0.3
Rest HR: 71 {beats}/min
Rest Nuclear Isotope Dose: 11 mCi
SDS: 1
SRS: 0
SSS: 1
ST Depression (mm): 0 mm
Stress Nuclear Isotope Dose: 30 mCi
TID: 1.22

## 2021-05-17 MED ORDER — TECHNETIUM TC 99M TETROFOSMIN IV KIT
10.0000 | PACK | Freq: Once | INTRAVENOUS | Status: AC | PRN
  Administered 2021-05-17: 11 via INTRAVENOUS

## 2021-05-17 MED ORDER — REGADENOSON 0.4 MG/5ML IV SOLN
INTRAVENOUS | Status: AC
  Administered 2021-05-17: 0.4 mg via INTRAVENOUS
  Filled 2021-05-17: qty 5

## 2021-05-17 MED ORDER — TECHNETIUM TC 99M TETROFOSMIN IV KIT
30.0000 | PACK | Freq: Once | INTRAVENOUS | Status: AC | PRN
  Administered 2021-05-17: 30 via INTRAVENOUS

## 2021-05-17 MED ORDER — SODIUM CHLORIDE FLUSH 0.9 % IV SOLN
INTRAVENOUS | Status: AC
  Administered 2021-05-17: 10 mL via INTRAVENOUS
  Filled 2021-05-17: qty 10

## 2021-05-19 DIAGNOSIS — J9610 Chronic respiratory failure, unspecified whether with hypoxia or hypercapnia: Secondary | ICD-10-CM | POA: Diagnosis not present

## 2021-05-19 DIAGNOSIS — J449 Chronic obstructive pulmonary disease, unspecified: Secondary | ICD-10-CM | POA: Diagnosis not present

## 2021-05-23 ENCOUNTER — Telehealth: Payer: Self-pay | Admitting: *Deleted

## 2021-05-23 NOTE — Telephone Encounter (Signed)
Pt called for stress test results. Results given pt voiced understanding.

## 2021-05-28 ENCOUNTER — Telehealth: Payer: Self-pay

## 2021-05-28 NOTE — Telephone Encounter (Signed)
Pt notified and voiced understanding. Pt had no questions or concerns at this time. Pt has been added to recall list for 1 yr f/u.

## 2021-05-28 NOTE — Telephone Encounter (Signed)
-----   Message from Antoine Poche, MD sent at 05/27/2021  9:44 AM EDT ----- Normal stress tests, there is no evidence of any significant blockages in the heart. F/u 1 year  Dominga Ferry MD

## 2021-05-31 DIAGNOSIS — J449 Chronic obstructive pulmonary disease, unspecified: Secondary | ICD-10-CM | POA: Diagnosis not present

## 2021-06-08 ENCOUNTER — Other Ambulatory Visit: Payer: Self-pay

## 2021-06-08 ENCOUNTER — Ambulatory Visit (INDEPENDENT_AMBULATORY_CARE_PROVIDER_SITE_OTHER): Payer: Medicare Other | Admitting: Internal Medicine

## 2021-06-08 ENCOUNTER — Encounter: Payer: Self-pay | Admitting: Internal Medicine

## 2021-06-08 VITALS — BP 131/79 | HR 83 | Resp 18 | Ht 62.0 in | Wt 160.0 lb

## 2021-06-08 DIAGNOSIS — Z23 Encounter for immunization: Secondary | ICD-10-CM

## 2021-06-08 DIAGNOSIS — Z7689 Persons encountering health services in other specified circumstances: Secondary | ICD-10-CM

## 2021-06-08 DIAGNOSIS — J9611 Chronic respiratory failure with hypoxia: Secondary | ICD-10-CM

## 2021-06-08 DIAGNOSIS — Z114 Encounter for screening for human immunodeficiency virus [HIV]: Secondary | ICD-10-CM

## 2021-06-08 DIAGNOSIS — I700 Atherosclerosis of aorta: Secondary | ICD-10-CM

## 2021-06-08 DIAGNOSIS — J431 Panlobular emphysema: Secondary | ICD-10-CM

## 2021-06-08 DIAGNOSIS — M8000XD Age-related osteoporosis with current pathological fracture, unspecified site, subsequent encounter for fracture with routine healing: Secondary | ICD-10-CM | POA: Diagnosis not present

## 2021-06-08 DIAGNOSIS — S52502S Unspecified fracture of the lower end of left radius, sequela: Secondary | ICD-10-CM

## 2021-06-08 DIAGNOSIS — E782 Mixed hyperlipidemia: Secondary | ICD-10-CM | POA: Diagnosis not present

## 2021-06-08 DIAGNOSIS — M4854XA Collapsed vertebra, not elsewhere classified, thoracic region, initial encounter for fracture: Secondary | ICD-10-CM | POA: Insufficient documentation

## 2021-06-08 DIAGNOSIS — Z1231 Encounter for screening mammogram for malignant neoplasm of breast: Secondary | ICD-10-CM

## 2021-06-08 MED ORDER — DENOSUMAB 60 MG/ML ~~LOC~~ SOSY: 60.0000 mg | PREFILLED_SYRINGE | Freq: Once | SUBCUTANEOUS | 0 refills | Status: AC

## 2021-06-08 MED ORDER — FLUTICASONE-SALMETEROL 250-50 MCG/ACT IN AEPB: 1.0000 | INHALATION_SPRAY | Freq: Two times a day (BID) | RESPIRATORY_TRACT | 1 refills | Status: DC

## 2021-06-08 NOTE — Assessment & Plan Note (Signed)
On statin.

## 2021-06-08 NOTE — Assessment & Plan Note (Signed)
Followed by orthopedic surgeon On Percocet as needed

## 2021-06-08 NOTE — Assessment & Plan Note (Signed)
Due to COPD, has quit smoking more than 10 years ago As portable O2 as needed

## 2021-06-08 NOTE — Patient Instructions (Signed)
Please continue taking medications as prescribed.  Please call clinic for Prolia administration once you receive it.

## 2021-06-08 NOTE — Assessment & Plan Note (Signed)
Well-controlled with Advair and as needed albuterol °Has portable O2 as needed °

## 2021-06-08 NOTE — Progress Notes (Signed)
New Patient Office Visit  Subjective:  Patient ID: Abigail Gill, female    DOB: 1961/05/08  Age: 60 y.o. MRN: [id number]  CC:  Chief Complaint  Patient presents with   New Patient (Initial Visit)    New patient was being seen at dr Warrick Paris[facility] office. Just establishing care     HPI Abigail Gill is a 60 y.o. female with PMH of atherosclerosis of aorta, HLD, COPD, osteoporosis with history of thoracic and lumbar compression fractures and environmental allergies who presents for establishing care.  She recently had a Lexiscan done for evaluation for CAD, which was normal.  Her blood pressure is WNL.  She denies any chest pain or palpitations.  She has history of chronic respiratory failure due to COPD.  She uses Advair and as needed albuterol inhalers.  She is on Zyrtec for environmental allergies.  She denies any dyspnea or wheezing currently.  She uses O2 as needed while walking and during exertion.  She has history of osteoporosis and compression fractures of thoracic and lumbar vertebra.  She did not tolerate alendronate in the past.  Her last DEXA scan showed persistent osteoporosis, for which she is currently only taking calcium and vitamin D supplement.  She sees orthopedic surgeon for closed fracture of left distal radius and ulna.  She is on Percocet as needed for severe pain.  She has had 1 dose of J&J vaccine.  She is up-to-date with pneumococcal vaccine.  She received flu vaccine in the office today.  Past Medical History:  Diagnosis Date   Aortic atherosclerosis ([place])    Asthma    Back pain    Complex regional pain syndrome type 1 of left upper extremity 03/14/2020   COPD (chronic obstructive pulmonary disease) (HCC)    Former cigarette smoker 07/07/2017   GERD (gastroesophageal reflux disease)    Hyperlipidemia 02/15/2021   Osteoporosis    Shortness of breath    with exertion   Vitamin D deficiency     Past Surgical History:  Procedure Laterality Date   BREAST  BIOPSY Left    benign   BREAST ENHANCEMENT SURGERY  1996   bilateral-[place]   BREAST EXCISIONAL BIOPSY Left    benign   BREAST LUMPECTOMY  08/30/2011   Procedure: LUMPECTOMY;  Surgeon: Donato Heinz;  Location: [facility];  Service: General;  Laterality: Left;   KYPHOPLASTY  2018   TUBAL LIGATION  1985   [place]    Family History  Problem Relation Age of Onset   Alcohol abuse Mother    Arthritis Mother    COPD Mother    Hyperlipidemia Mother    Pancreatitis Mother    Heart failure Father    Alcohol abuse Father    Arthritis Father    Diabetes Father    Heart disease Father        several heart attacks   Hyperlipidemia Father    Hypertension Father    Kidney disease Father    Stroke Father        several strokes   COPD Sister    Hypertension Sister    Anesthesia problems Neg Hx    Hypotension Neg Hx    Malignant hyperthermia Neg Hx    Pseudochol deficiency Neg Hx     Social History   Socioeconomic History   Marital status: Married    Spouse name: Abigail Gill   Number of children: 3   Years of education: 11   Highest education level: Not on  file  Occupational History   Occupation: disabled    Comment: COPD  Tobacco Use   Smoking status: Former    Packs/day: 1.00    Years: 30.00    Pack years: 30.00    Types: Cigarettes    Start date: 09/16/1976    Quit date: 04/18/2009    Years since quitting: 12.1   Smokeless tobacco: Never  Vaping Use   Vaping Use: Never used  Substance and Sexual Activity   Alcohol use: No    Alcohol/week: 0.0 standard drinks   Drug use: No   Sexual activity: Yes  Other Topics Concern   Not on file  Social History Narrative   Disabled due to COPD   Was a pharmacy tech   Lives at home with  [place] .Married for 19 years.   1 chihuahua.   Social Determinants of Health   Financial Resource Strain: Not on file  Food Insecurity: Not on file  Transportation Needs: Not on file  Physical Activity: Not on file  Stress:  Not on file  Social Connections: Not on file  Intimate Partner Violence: Not on file    ROS Review of Systems  Constitutional:  Negative for chills and fever.  HENT:  Negative for congestion, sinus pressure, sinus pain and sore throat.   Eyes:  Negative for pain and discharge.  Respiratory:  Negative for cough and shortness of breath.   Cardiovascular:  Negative for chest pain and palpitations.  Gastrointestinal:  Negative for abdominal pain, constipation, diarrhea, nausea and vomiting.  Endocrine: Negative for polydipsia and polyuria.  Genitourinary:  Negative for dysuria and hematuria.  Musculoskeletal:  Positive for arthralgias, back pain and gait problem. Negative for neck pain and neck stiffness.  Skin:  Negative for rash.  Neurological:  Negative for dizziness and weakness.  Psychiatric/Behavioral:  Negative for agitation and behavioral problems.    Objective:   Today's Vitals: BP 131/79 (BP Location: Left Arm, Patient Position: Sitting, Cuff Size: Normal)   Pulse 83   Resp 18   Ht '5\' 2"'  (1.575 m)   Wt 160 lb 0.6 oz (72.6 kg)   SpO2 93%   BMI 29.27 kg/m   Physical Exam Vitals reviewed.  Constitutional:      General: She is not in acute distress.    Appearance: She is not diaphoretic.  HENT:     Head: Normocephalic and atraumatic.     Nose: Nose normal.     Mouth/Throat:     Mouth: Mucous membranes are moist.  Eyes:     General: No scleral icterus.    Extraocular Movements: Extraocular movements intact.  Cardiovascular:     Rate and Rhythm: Normal rate and regular rhythm.     Pulses: Normal pulses.     Heart sounds: Normal heart sounds. No murmur heard. Pulmonary:     Breath sounds: Normal breath sounds. No wheezing or rales.  Abdominal:     Palpations: Abdomen is soft.     Tenderness: There is no abdominal tenderness.  Musculoskeletal:     Cervical back: Neck supple. No tenderness.     Right lower leg: No edema.     Left lower leg: No edema.  Skin:     General: Skin is warm.     Findings: No rash.  Neurological:     General: No focal deficit present.     Mental Status: She is alert and oriented to person, place, and time.  Psychiatric:        Mood  and Affect: Mood normal.        Behavior: Behavior normal.    Assessment & Plan:   Problem List Items Addressed This Visit       Cardiovascular and Mediastinum   Aortic atherosclerosis ([place])    On statin      Relevant Orders   Lipid panel     Respiratory   Panlobular emphysema ([place])    Well-controlled with Advair and as needed albuterol Has portable O2 as needed      Relevant Medications   fluticasone-salmeterol (ADVAIR) 250-50 MCG/ACT AEPB   Chronic respiratory failure (HCC)    Due to COPD, has quit smoking more than 10 years ago As portable O2 as needed        Musculoskeletal and Integument   Osteoporosis with pathological fracture with routine healing    History of compression fracture of thoracic and lumbar vertebra s/p kyphoplasty Did not tolerate bisphosphonate Last DEXA scan reviewed -would prefer to start Prolia Continue calcium and vitamin D supplements      Relevant Medications   denosumab (PROLIA) 60 MG/ML SOSY injection   Other Relevant Orders   Vitamin D 1,25 dihydroxy   Closed fracture of left distal radius and ulna    Followed by orthopedic surgeon On Percocet as needed        Other   Hyperlipidemia    On statin      Other Visit Diagnoses     Encounter to establish care    -  Primary Care established History and medications reviewed with the patient   Relevant Orders   CMP14+EGFR   CBC with Differential/Platelet   Screening mammogram for breast cancer       Relevant Orders   MM 3D SCREEN BREAST BILATERAL   Need for immunization against influenza       Relevant Orders   Flu Vaccine QUAD 8moIM (Fluarix, Fluzone & Alfiuria Quad PF) (Completed)   Encounter for screening for HIV       Relevant Orders   HIV antibody (with reflex)        Outpatient Encounter Medications as of 06/08/2021  Medication Sig   albuterol (VENTOLIN HFA) 108 (90 Base) MCG/ACT inhaler Inhale 2 puffs into the lungs every 4 (four) hours as needed. For shortness of breath   aspirin 81 MG chewable tablet Chew 1 tablet by mouth daily.   atorvastatin (LIPITOR) 10 MG tablet Take 1 tablet (10 mg total) by mouth at bedtime.   cetirizine (ZYRTEC) 10 MG tablet Take 10 mg by mouth at bedtime.   cholecalciferol (VITAMIN D3) 25 MCG (1000 UT) tablet Take 10,000 Units by mouth daily.    denosumab (PROLIA) 60 MG/ML SOSY injection Inject 60 mg into the skin once for 1 dose.   oxyCODONE-acetaminophen (PERCOCET) 7.5-325 MG tablet Take 1 tablet by mouth every 4 (four) hours as needed for severe pain.   [DISCONTINUED] fluticasone-salmeterol (ADVAIR) 250-50 MCG/ACT AEPB Inhale 1 puff into the lungs in the morning and at bedtime.   fluticasone-salmeterol (ADVAIR) 250-50 MCG/ACT AEPB Inhale 1 puff into the lungs in the morning and at bedtime.   [DISCONTINUED] alendronate (FOSAMAX) 10 MG tablet Take 1 tablet (10 mg total) by mouth daily before breakfast. Take with a full glass of water on an empty stomach. (Patient not taking: Reported on 06/08/2021)   [DISCONTINUED] Fluticasone-Salmeterol (ADVAIR) 250-50 MCG/DOSE AEPB Inhale 1 puff into the lungs every 12 (twelve) hours. (Patient not taking: Reported on 06/08/2021)   [DISCONTINUED] ibuprofen (ADVIL) 800 MG tablet  Take 1 tablet (800 mg total) by mouth every 8 (eight) hours as needed. (Patient not taking: Reported on 06/08/2021)   [DISCONTINUED] nystatin (MYCOSTATIN) 100000 UNIT/ML suspension Take 5 mLs (500,000 Units total) by mouth 4 (four) times daily. (Patient not taking: Reported on 06/08/2021)   [DISCONTINUED] ondansetron (ZOFRAN) 4 MG tablet Take 1 tablet (4 mg total) by mouth every 8 (eight) hours as needed for nausea or vomiting. (Patient not taking: Reported on 06/08/2021)   [DISCONTINUED] tiZANidine (ZANAFLEX) 2 MG tablet  Take 2 mg by mouth as needed. (Patient not taking: Reported on 06/08/2021)   No facility-administered encounter medications on file as of 06/08/2021.    Follow-up: Return in about 3 months (around 09/07/2021) for COPD and osteoporosis.   Lindell Spar, MD

## 2021-06-08 NOTE — Assessment & Plan Note (Addendum)
History of compression fracture of thoracic and lumbar vertebra s/p kyphoplasty Did not tolerate bisphosphonate Last DEXA scan reviewed -would prefer to start Prolia Continue calcium and vitamin D supplements

## 2021-06-12 ENCOUNTER — Other Ambulatory Visit: Payer: Self-pay

## 2021-06-12 ENCOUNTER — Ambulatory Visit (INDEPENDENT_AMBULATORY_CARE_PROVIDER_SITE_OTHER): Payer: Medicare Other

## 2021-06-12 DIAGNOSIS — Z0000 Encounter for general adult medical examination without abnormal findings: Secondary | ICD-10-CM

## 2021-06-12 NOTE — Patient Instructions (Signed)
Health Maintenance, Female Adopting a healthy lifestyle and getting preventive care are important in promoting health and wellness. Ask your health care provider about: The right schedule for you to have regular tests and exams. Things you can do on your own to prevent diseases and keep yourself healthy. What should I know about diet, weight, and exercise? Eat a healthy diet  Eat a diet that includes plenty of vegetables, fruits, low-fat dairy products, and lean protein. Do not eat a lot of foods that are high in solid fats, added sugars, or sodium. Maintain a healthy weight Body mass index (BMI) is used to identify weight problems. It estimates body fat based on height and weight. Your health care provider can help determine your BMI and help you achieve or maintain a healthy weight. Get regular exercise Get regular exercise. This is one of the most important things you can do for your health. Most adults should: Exercise for at least 150 minutes each week. The exercise should increase your heart rate and make you sweat (moderate-intensity exercise). Do strengthening exercises at least twice a week. This is in addition to the moderate-intensity exercise. Spend less time sitting. Even light physical activity can be beneficial. Watch cholesterol and blood lipids Have your blood tested for lipids and cholesterol at 62 years of age, then have this test every 5 years. Have your cholesterol levels checked more often if: Your lipid or cholesterol levels are high. You are older than 62 years of age. You are at high risk for heart disease. What should I know about cancer screening? Depending on your health history and family history, you may need to have cancer screening at various ages. This may include screening for: Breast cancer. Cervical cancer. Colorectal cancer. Skin cancer. Lung cancer. What should I know about heart disease, diabetes, and high blood pressure? Blood pressure and heart  disease High blood pressure causes heart disease and increases the risk of stroke. This is more likely to develop in people who have high blood pressure readings, are of African descent, or are overweight. Have your blood pressure checked: Every 3-5 years if you are 18-39 years of age. Every year if you are 40 years old or older. Diabetes Have regular diabetes screenings. This checks your fasting blood sugar level. Have the screening done: Once every three years after age 40 if you are at a normal weight and have a low risk for diabetes. More often and at a younger age if you are overweight or have a high risk for diabetes. What should I know about preventing infection? Hepatitis B If you have a higher risk for hepatitis B, you should be screened for this virus. Talk with your health care provider to find out if you are at risk for hepatitis B infection. Hepatitis C Testing is recommended for: Everyone born from 1945 through 1965. Anyone with known risk factors for hepatitis C. Sexually transmitted infections (STIs) Get screened for STIs, including gonorrhea and chlamydia, if: You are sexually active and are younger than 62 years of age. You are older than 62 years of age and your health care provider tells you that you are at risk for this type of infection. Your sexual activity has changed since you were last screened, and you are at increased risk for chlamydia or gonorrhea. Ask your health care provider if you are at risk. Ask your health care provider about whether you are at high risk for HIV. Your health care provider may recommend a prescription medicine   to help prevent HIV infection. If you choose to take medicine to prevent HIV, you should first get tested for HIV. You should then be tested every 3 months for as long as you are taking the medicine. Pregnancy If you are about to stop having your period (premenopausal) and you may become pregnant, seek counseling before you get  pregnant. Take 400 to 800 micrograms (mcg) of folic acid every day if you become pregnant. Ask for birth control (contraception) if you want to prevent pregnancy. Osteoporosis and menopause Osteoporosis is a disease in which the bones lose minerals and strength with aging. This can result in bone fractures. If you are 65 years old or older, or if you are at risk for osteoporosis and fractures, ask your health care provider if you should: Be screened for bone loss. Take a calcium or vitamin D supplement to lower your risk of fractures. Be given hormone replacement therapy (HRT) to treat symptoms of menopause. Follow these instructions at home: Lifestyle Do not use any products that contain nicotine or tobacco, such as cigarettes, e-cigarettes, and chewing tobacco. If you need help quitting, ask your health care provider. Do not use street drugs. Do not share needles. Ask your health care provider for help if you need support or information about quitting drugs. Alcohol use Do not drink alcohol if: Your health care provider tells you not to drink. You are pregnant, may be pregnant, or are planning to become pregnant. If you drink alcohol: Limit how much you use to 0-1 drink a day. Limit intake if you are breastfeeding. Be aware of how much alcohol is in your drink. In the [place] one drink equals one 12 oz bottle of beer (355 mL), one 5 oz glass of wine (148 mL), or one 1 oz glass of hard liquor (44 mL). General instructions Schedule regular health, dental, and eye exams. Stay current with your vaccines. Tell your health care provider if: You often feel depressed. You have ever been abused or do not feel safe at home. Summary Adopting a healthy lifestyle and getting preventive care are important in promoting health and wellness. Follow your health care provider's instructions about healthy diet, exercising, and getting tested or screened for diseases. Follow your health care provider's  instructions on monitoring your cholesterol and blood pressure. This information is not intended to replace advice given to you by your health care provider. Make sure you discuss any questions you have with your health care provider. Document Revised: 11/10/2020 Document Reviewed: 08/26/2018 Elsevier Patient Education  2022 Elsevier Inc.  

## 2021-06-12 NOTE — Progress Notes (Signed)
Subjective:   Abigail Gill is a 60 y.o. female who presents for Medicare Annual (Subsequent) preventive examination. I connected with  Abigail Gill on 06/12/21 by a audio enabled telemedicine application and verified that I am speaking with the correct person using two identifiers.  Patient Location: Home  Provider Location: Home Office  I discussed the limitations of evaluation and management by telemedicine. The patient expressed understanding and agreed to proceed.   Review of Systems    Defer to PCP        Objective:    There were no vitals filed for this visit. There is no height or weight on file to calculate BMI.  Advanced Directives 06/12/2021 02/11/2020 08/26/2011  Does Patient Have a Medical Advance Directive? No No Patient does not have advance directive;Patient would not like information  Would patient like information on creating a medical advance directive? - No - Patient declined -  Pre-existing out of facility DNR order (yellow form or pink MOST form) - - No    Current Medications (verified) Outpatient Encounter Medications as of 06/12/2021  Medication Sig   albuterol (VENTOLIN HFA) 108 (90 Base) MCG/ACT inhaler Inhale 2 puffs into the lungs every 4 (four) hours as needed. For shortness of breath   aspirin 81 MG chewable tablet Chew 1 tablet by mouth daily.   atorvastatin (LIPITOR) 10 MG tablet Take 1 tablet (10 mg total) by mouth at bedtime.   cetirizine (ZYRTEC) 10 MG tablet Take 10 mg by mouth at bedtime.   cholecalciferol (VITAMIN D3) 25 MCG (1000 UT) tablet Take 10,000 Units by mouth daily.    fluticasone-salmeterol (ADVAIR) 250-50 MCG/ACT AEPB Inhale 1 puff into the lungs in the morning and at bedtime.   oxyCODONE-acetaminophen (PERCOCET) 7.5-325 MG tablet Take 1 tablet by mouth every 4 (four) hours as needed for severe pain.   PROLIA 60 MG/ML SOSY injection Inject into the skin.   No facility-administered encounter medications on file as of 06/12/2021.     Allergies (verified) Alendronate sodium and Pregabalin   History: Past Medical History:  Diagnosis Date   Aortic atherosclerosis (HCC)    Asthma    Back pain    Complex regional pain syndrome type 1 of left upper extremity 03/14/2020   COPD (chronic obstructive pulmonary disease) (HCC)    Former cigarette smoker 07/07/2017   GERD (gastroesophageal reflux disease)    Hyperlipidemia 02/15/2021   Osteoporosis    Shortness of breath    with exertion   Vitamin D deficiency    Past Surgical History:  Procedure Laterality Date   BREAST BIOPSY Left    benign   BREAST ENHANCEMENT SURGERY  1996   bilateral-[place]   BREAST EXCISIONAL BIOPSY Left    benign   BREAST LUMPECTOMY  08/30/2011   Procedure: LUMPECTOMY;  Surgeon: Fabio Bering;  Location: [facility];  Service: General;  Laterality: Left;   KYPHOPLASTY  2018   TUBAL LIGATION  1985   Abigail Gill   Family History  Problem Relation Age of Onset   Alcohol abuse Mother    Arthritis Mother    COPD Mother    Hyperlipidemia Mother    Pancreatitis Mother    Heart failure Father    Alcohol abuse Father    Arthritis Father    Diabetes Father    Heart disease Father        several heart attacks   Hyperlipidemia Father    Hypertension Father    Kidney disease Father  Stroke Father        several strokes   COPD Sister    Hypertension Sister    Anesthesia problems Neg Hx    Hypotension Neg Hx    Malignant hyperthermia Neg Hx    Pseudochol deficiency Neg Hx    Social History   Socioeconomic History   Marital status: Married    Spouse name: Abigail Gill   Number of children: 3   Years of education: 11   Highest education level: Not on file  Occupational History   Occupation: disabled    Comment: COPD  Tobacco Use   Smoking status: Former    Packs/day: 1.00    Years: 30.00    Pack years: 30.00    Types: Cigarettes    Start date: 09/16/1976    Quit date: 04/18/2009    Years since quitting: 12.1   Smokeless  tobacco: Never  Vaping Use   Vaping Use: Never used  Substance and Sexual Activity   Alcohol use: No    Alcohol/week: 0.0 standard drinks   Drug use: No   Sexual activity: Yes  Other Topics Concern   Not on file  Social History Narrative   Disabled due to COPD   Was a pharmacy tech   Lives at home with  Husband,Abigail Gill .Married for 19 years.   1 chihuahua.   Social Determinants of Health   Financial Resource Strain: Low Risk    Difficulty of Paying Living Expenses: Not hard at all  Food Insecurity: No Food Insecurity   Worried About Programme researcher, broadcasting/film/video in the Last Year: Never true   Ran Out of Food in the Last Year: Never true  Transportation Needs: No Transportation Needs   Lack of Transportation (Medical): No   Lack of Transportation (Non-Medical): No  Physical Activity: Inactive   Days of Exercise per Week: 0 days   Minutes of Exercise per Session: 0 min  Stress: No Stress Concern Present   Feeling of Stress : Not at all  Social Connections: Unknown   Frequency of Communication with Friends and Family: More than three times a week   Frequency of Social Gatherings with Friends and Family: Once a week   Attends Religious Services: Not on Scientist, clinical (histocompatibility and immunogenetics) or Organizations: No   Attends Banker Meetings: Never   Marital Status: Married    Tobacco Counseling Counseling given: Not Answered   Clinical Intake:  Pre-visit preparation completed: Yes        Diabetes: No  How often do you need to have someone help you when you read instructions, pamphlets, or other written materials from your doctor or pharmacy?: 1 - Never What is the last grade level you completed in school?: 11TH  Diabetic?NO  Interpreter Needed?: No  Information entered by :: Tarra Pence J,CMA   Activities of Daily Living In your present state of health, do you have any difficulty performing the following activities: 06/12/2021  Hearing? N  Vision? N  Difficulty  concentrating or making decisions? N  Walking or climbing stairs? Y  Dressing or bathing? N  Doing errands, shopping? N  Preparing Food and eating ? N  Using the Toilet? N  In the past six months, have you accidently leaked urine? N  Do you have problems with loss of bowel control? N  Managing your Medications? N  Managing your Finances? N  Housekeeping or managing your Housekeeping? N  Some recent data might be hidden    Patient  Care Team: Anabel Halon, MD as PCP - General (Internal Medicine)  Indicate any recent Medical Services you may have received from other than [facility] providers in the past year (date may be approximate).     Assessment:   This is a routine wellness examination for Abigail Gill.  Hearing/Vision screen No results found.  Dietary issues and exercise activities discussed: Current Exercise Habits: The patient does not participate in regular exercise at present   Goals Addressed   None    Depression Screen PHQ 2/9 Scores 06/08/2021 02/15/2021 10/25/2020 04/24/2020 02/11/2020 01/13/2020 01/01/2018  PHQ - 2 Score 0 0 0 0 0 0 0  PHQ- 9 Score - 0 - - - - -  Exception Documentation - - - Medical reason - - -    Fall Risk Fall Risk  06/08/2021 10/25/2020 04/24/2020 02/11/2020 01/13/2020  Falls in the past year? 0 0 0 1 1  Number falls in past yr: 0 0 0 0 0  Injury with Fall? 0 0 0 1 1  Risk for fall due to : No Fall Risks No Fall Risks No Fall Risks History of fall(s) -  Follow up Falls evaluation completed Falls evaluation completed Falls evaluation completed Falls evaluation completed;Education provided;Falls prevention discussed -    FALL RISK PREVENTION PERTAINING TO THE HOME:  Any stairs in or around the home? Yes  If so, are there any without handrails? No  Home free of loose throw rugs in walkways, pet beds, electrical cords, etc? Yes  Adequate lighting in your home to reduce risk of falls? Yes   ASSISTIVE DEVICES UTILIZED TO PREVENT FALLS:  Life alert? No  Use  of a cane, walker or w/c? Yes  Grab bars in the bathroom? Yes  Shower chair or bench in shower? Yes  Elevated toilet seat or a handicapped toilet? Yes   TIMED UP AND GO:  Was the test performed?  N/A .  Length of time to ambulate 10 feet: N/A sec.     Cognitive Function:     6CIT Screen 06/12/2021 02/11/2020  What Year? 0 points 0 points  What month? 0 points 0 points  What time? 0 points 0 points  Count back from 20 0 points 0 points  Months in reverse 0 points 0 points  Repeat phrase 0 points 0 points  Total Score 0 0    Immunizations Immunization History  Administered Date(s) Administered   Influenza, Quadrivalent, Recombinant, Inj, Pf 06/25/2019   Influenza,inj,Quad PF,6+ Mos 06/29/2017, 07/13/2020, 06/08/2021   Influenza-Unspecified 06/29/2017, 06/25/2019   Janssen (J&J) SARS-COV-2 Vaccination 01/22/2020   Pneumococcal Conjugate-13 07/07/2017   Pneumococcal Polysaccharide-23 01/13/2020   Td 01/14/2017    TDAP status: Up to date  Flu Vaccine status: Up to date    Covid-19 vaccine status: Information provided on how to obtain vaccines.   Qualifies for Shingles Vaccine? Yes   Zostavax completed No   Shingrix Completed?: No.    Education has been provided regarding the importance of this vaccine. Patient has been advised to call insurance company to determine out of pocket expense if they have not yet received this vaccine. Advised may also receive vaccine at local pharmacy or Health Dept. Verbalized acceptance and understanding.  Screening Tests Health Maintenance  Topic Date Due   Zoster Vaccines- Shingrix (1 of 2) Never done   COVID-19 Vaccine (2 - Janssen risk series) 02/19/2020   MAMMOGRAM  11/11/2020   PAP SMEAR-Modifier  11/14/2021   COLONOSCOPY (Pts 45-41yrs Insurance coverage will need  to be confirmed)  05/28/2026   TETANUS/TDAP  06/17/2027   INFLUENZA VACCINE  Completed   Hepatitis C Screening  Completed   HIV Screening  Completed   HPV VACCINES   Aged Out    Health Maintenance  Health Maintenance Due  Topic Date Due   Zoster Vaccines- Shingrix (1 of 2) Never done   COVID-19 Vaccine (2 - Janssen risk series) 02/19/2020   MAMMOGRAM  11/11/2020    Colorectal cancer screening: Type of screening: Colonoscopy. Completed 05/28/2016. Repeat every 10 years  Mammogram status: Ordered 06/15/2021. Pt provided with contact info and advised to call to schedule appt.  Scheduled for 06/15/2021    Lung Cancer Screening: (Low Dose CT Chest recommended if Age 46-80 years, 30 pack-year currently smoking OR have quit w/in 15years.) does not qualify.   Lung Cancer Screening Referral: NO  Additional Screening:  Hepatitis C Screening: does qualify; Completed 08/16/2018  Vision Screening: Recommended annual ophthalmology exams for early detection of glaucoma and other disorders of the eye. Is the patient up to date with their annual eye exam?  Yes  Who is the provider or what is the name of the office in which the patient attends annual eye exams? Dr. Mayford Knife If pt is not established with a provider, would they like to be referred to a provider to establish care? No .   Dental Screening: Recommended annual dental exams for proper oral hygiene  Community Resource Referral / Chronic Care Management: CRR required this visit?  No   CCM required this visit?  No      Plan:     I have personally reviewed and noted the following in the patient's chart:   Medical and social history Use of alcohol, tobacco or illicit drugs  Current medications and supplements including opioid prescriptions.  Functional ability and status Nutritional status Physical activity Advanced directives List of other physicians Hospitalizations, surgeries, and ER visits in previous 12 months Vitals Screenings to include cognitive, depression, and falls Referrals and appointments  In addition, I have reviewed and discussed with patient certain preventive protocols,  quality metrics, and best practice recommendations. A written personalized care plan for preventive services as well as general preventive health recommendations were provided to patient.     Victorio Palm, [facility]   06/12/2021   Nurse Notes: Non Face to Face 30 minute visit     Ms. Luan Pulling , Thank you for taking time to come for your Medicare Wellness Visit. I appreciate your ongoing commitment to your health goals. Please review the following plan we discussed and let me know if I can assist you in the future.   These are the goals we discussed:  Goals      DIET - INCREASE WATER INTAKE     Exercise 150 minutes per week (moderate activity)     Exercise 5 X per week (30 min per time)        This is a list of the screening recommended for you and due dates:  Health Maintenance  Topic Date Due   Zoster (Shingles) Vaccine (1 of 2) Never done   COVID-19 Vaccine (2 - Janssen risk series) 02/19/2020   Mammogram  11/11/2020   Pap Smear  11/14/2021   Colon Cancer Screening  05/28/2026   Tetanus Vaccine  06/17/2027   Flu Shot  Completed   Hepatitis C Screening: USPSTF Recommendation to screen - Ages 18-79 yo.  Completed   HIV Screening  Completed   HPV Vaccine  Aged Out

## 2021-06-15 ENCOUNTER — Ambulatory Visit (HOSPITAL_COMMUNITY)
Admission: RE | Admit: 2021-06-15 | Discharge: 2021-06-15 | Disposition: A | Discharge status: Home / Self Care | Payer: Medicare Other | Source: Ambulatory Visit | Attending: Internal Medicine | Admitting: Internal Medicine

## 2021-06-15 ENCOUNTER — Other Ambulatory Visit: Payer: Self-pay

## 2021-06-15 ENCOUNTER — Other Ambulatory Visit: Payer: Self-pay | Admitting: Internal Medicine

## 2021-06-15 DIAGNOSIS — Z1231 Encounter for screening mammogram for malignant neoplasm of breast: Secondary | ICD-10-CM | POA: Insufficient documentation

## 2021-06-16 LAB — CMP14+EGFR
ALT: 16 IU/L (ref 0–32)
AST: 17 IU/L (ref 0–40)
Albumin/Globulin Ratio: 2.1 (ref 1.2–2.2)
Albumin: 4.7 g/dL (ref 3.8–4.8)
Alkaline Phosphatase: 114 IU/L (ref 44–121)
BUN/Creatinine Ratio: 13 (ref 12–28)
BUN: 10 mg/dL (ref 8–27)
Bilirubin Total: 0.4 mg/dL (ref 0.0–1.2)
CO2: 24 mmol/L (ref 20–29)
Calcium: 9.7 mg/dL (ref 8.7–10.3)
Chloride: 103 mmol/L (ref 96–106)
Creatinine, Ser: 0.77 mg/dL (ref 0.57–1.00)
Globulin, Total: 2.2 g/dL (ref 1.5–4.5)
Glucose: 89 mg/dL (ref 65–99)
Potassium: 4.2 mmol/L (ref 3.5–5.2)
Sodium: 142 mmol/L (ref 134–144)
Total Protein: 6.9 g/dL (ref 6.0–8.5)
eGFR: 87 mL/min/{1.73_m2} (ref >59)

## 2021-06-16 LAB — VITAMIN D 1,25 DIHYDROXY
Vitamin D 1, 25 (OH)2 Total: 46 pg/mL
Vitamin D2 1, 25 (OH)2: <10 pg/mL
Vitamin D3 1, 25 (OH)2: 46 pg/mL

## 2021-06-16 LAB — LIPID PANEL
Chol/HDL Ratio: 2.5 ratio (ref 0.0–4.4)
Cholesterol, Total: 179 mg/dL (ref 100–199)
HDL: 71 mg/dL (ref >39)
LDL Chol Calc (NIH): 90 mg/dL (ref 0–99)
Triglycerides: 100 mg/dL (ref 0–149)
VLDL Cholesterol Cal: 18 mg/dL (ref 5–40)

## 2021-06-16 LAB — CBC WITH DIFFERENTIAL/PLATELET
Basophils Absolute: 0 10*3/uL (ref 0.0–0.2)
Basos: 1 %
EOS (ABSOLUTE): 0.2 10*3/uL (ref 0.0–0.4)
Eos: 3 %
Hematocrit: 38.8 % (ref 34.0–46.6)
Hemoglobin: 13.2 g/dL (ref 11.1–15.9)
Immature Grans (Abs): 0 10*3/uL (ref 0.0–0.1)
Immature Granulocytes: 0 %
Lymphocytes Absolute: 1.5 10*3/uL (ref 0.7–3.1)
Lymphs: 25 %
MCH: 29.5 pg (ref 26.6–33.0)
MCHC: 34 g/dL (ref 31.5–35.7)
MCV: 87 fL (ref 79–97)
Monocytes Absolute: 0.3 10*3/uL (ref 0.1–0.9)
Monocytes: 6 %
Neutrophils Absolute: 4 10*3/uL (ref 1.4–7.0)
Neutrophils: 65 %
Platelets: 244 10*3/uL (ref 150–450)
RBC: 4.48 x10E6/uL (ref 3.77–5.28)
RDW: 12.7 % (ref 11.7–15.4)
WBC: 6 10*3/uL (ref 3.4–10.8)

## 2021-06-16 LAB — HIV ANTIBODY (ROUTINE TESTING W REFLEX): HIV Screen 4th Generation wRfx: NONREACTIVE

## 2021-06-18 DIAGNOSIS — J449 Chronic obstructive pulmonary disease, unspecified: Secondary | ICD-10-CM | POA: Diagnosis not present

## 2021-06-18 DIAGNOSIS — J9610 Chronic respiratory failure, unspecified whether with hypoxia or hypercapnia: Secondary | ICD-10-CM | POA: Diagnosis not present

## 2021-06-21 ENCOUNTER — Other Ambulatory Visit (HOSPITAL_COMMUNITY): Payer: Self-pay | Admitting: Internal Medicine

## 2021-06-21 DIAGNOSIS — R928 Other abnormal and inconclusive findings on diagnostic imaging of breast: Secondary | ICD-10-CM

## 2021-06-30 DIAGNOSIS — J449 Chronic obstructive pulmonary disease, unspecified: Secondary | ICD-10-CM | POA: Diagnosis not present

## 2021-07-19 DIAGNOSIS — J9610 Chronic respiratory failure, unspecified whether with hypoxia or hypercapnia: Secondary | ICD-10-CM | POA: Diagnosis not present

## 2021-07-19 DIAGNOSIS — J449 Chronic obstructive pulmonary disease, unspecified: Secondary | ICD-10-CM | POA: Diagnosis not present

## 2021-07-31 DIAGNOSIS — J449 Chronic obstructive pulmonary disease, unspecified: Secondary | ICD-10-CM | POA: Diagnosis not present

## 2021-09-07 ENCOUNTER — Encounter: Payer: Self-pay | Admitting: Internal Medicine

## 2021-09-07 ENCOUNTER — Other Ambulatory Visit: Payer: Self-pay

## 2021-09-07 ENCOUNTER — Ambulatory Visit (INDEPENDENT_AMBULATORY_CARE_PROVIDER_SITE_OTHER): Payer: Medicare Other | Admitting: Internal Medicine

## 2021-09-07 DIAGNOSIS — J431 Panlobular emphysema: Secondary | ICD-10-CM | POA: Diagnosis not present

## 2021-09-07 DIAGNOSIS — M8000XD Age-related osteoporosis with current pathological fracture, unspecified site, subsequent encounter for fracture with routine healing: Secondary | ICD-10-CM | POA: Diagnosis not present

## 2021-09-07 DIAGNOSIS — R110 Nausea: Secondary | ICD-10-CM

## 2021-09-07 MED ORDER — ONDANSETRON HCL 4 MG PO TABS: 4.0000 mg | ORAL_TABLET | Freq: Three times a day (TID) | ORAL | 0 refills | Status: DC | PRN

## 2021-09-07 NOTE — Assessment & Plan Note (Signed)
History of compression fracture of thoracic and lumbar vertebra s/p kyphoplasty Did not tolerate bisphosphonate Last DEXA scan reviewed -would prefer to start Prolia, wants to discuss in the next visit Continue calcium and vitamin D supplements

## 2021-09-07 NOTE — Patient Instructions (Signed)
Please take Zofran as needed for nausea. Okay to take Pepcid 20 mg for gastritis/acid reflux/bloating.  Please continue to take other medications as prescribed.

## 2021-09-07 NOTE — Progress Notes (Signed)
Virtual Visit via Telephone Note   This visit type was conducted due to national recommendations for restrictions regarding the COVID-19 Pandemic (e.g. social distancing) in an effort to limit this patient's exposure and mitigate transmission in our community.  Due to her co-morbid illnesses, this patient is at least at moderate risk for complications without adequate follow up.  This format is felt to be most appropriate for this patient at this time.  The patient did not have access to video technology/had technical difficulties with video requiring transitioning to audio format only (telephone).  All issues noted in this document were discussed and addressed.  No physical exam could be performed with this format.  Evaluation Performed:  Follow-up visit  Date:  09/07/2021   ID:  Abigail Gill, Abigail Gill [birth year], MRN [id number]  Patient Location: Home Provider Location: Office/Clinic  Participants: Patient Location of Patient: Home Location of Provider: Telehealth Consent was obtain for visit to be over via telehealth. I verified that I am speaking with the correct person using two identifiers.  PCP:  Anabel Halon, MD   Chief Complaint: Nausea  History of Present Illness:    Abigail Gill is a 60 y.o. female with PMH of atherosclerosis of aorta, HLD, COPD, osteoporosis with history of thoracic and lumbar compression fractures and environmental allergies who has a televisit for follow-up of her chronic medical conditions.  She has been having nausea for last 2 days, but denies any vomiting, diarrhea, melena or hematochezia currently.  Denies any fever or chills.  She has taken Zofran for nausea in the past.  Denies any recent outdoor or unusual food intake.  She has history of osteoporosis and compression fractures of thoracic and lumbar vertebra.  She did not tolerate alendronate in the past.  Her last DEXA scan showed persistent osteoporosis, for which she is currently only  taking calcium and vitamin D supplement.  She was planned to start on Prolia in the last visit, but she could not get it as it was not covered by her insurance. She sees orthopedic surgeon for closed fracture of left distal radius and ulna.  She is on Percocet as needed for severe pain.  The patient does not have symptoms concerning for COVID-19 infection (fever, chills, cough, or new shortness of breath).   Past Medical, Surgical, Social History, Allergies, and Medications have been Reviewed.  Past Medical History:  Diagnosis Date   Aortic atherosclerosis (HCC)    Asthma    Back pain    Complex regional pain syndrome type 1 of left upper extremity 03/14/2020   COPD (chronic obstructive pulmonary disease) (HCC)    Former cigarette smoker 07/07/2017   GERD (gastroesophageal reflux disease)    Hyperlipidemia 02/15/2021   Osteoporosis    Shortness of breath    with exertion   Vitamin D deficiency    Past Surgical History:  Procedure Laterality Date   BREAST BIOPSY Left    benign   BREAST ENHANCEMENT SURGERY  1996   bilateral-[place]   BREAST EXCISIONAL BIOPSY Left    benign   BREAST LUMPECTOMY  08/30/2011   Procedure: LUMPECTOMY;  Surgeon: Fabio Bering;  Location: [facility];  Service: General;  Laterality: Left;   KYPHOPLASTY  2018   TUBAL LIGATION  1985   [place]     Current Meds  Medication Sig   albuterol (VENTOLIN HFA) 108 (90 Base) MCG/ACT inhaler Inhale 2 puffs into the lungs every 4 (four) hours as needed. For  shortness of breath   aspirin 81 MG chewable tablet Chew 1 tablet by mouth daily.   atorvastatin (LIPITOR) 10 MG tablet Take 1 tablet (10 mg total) by mouth at bedtime.   cetirizine (ZYRTEC) 10 MG tablet Take 10 mg by mouth at bedtime.   cholecalciferol (VITAMIN D3) 25 MCG (1000 UT) tablet Take 10,000 Units by mouth daily.    fluticasone-salmeterol (ADVAIR) 250-50 MCG/ACT AEPB Inhale 1 puff into the lungs in the morning and at bedtime.    oxyCODONE-acetaminophen (PERCOCET) 7.5-325 MG tablet Take 1 tablet by mouth every 4 (four) hours as needed for severe pain.   PROLIA 60 MG/ML SOSY injection Inject into the skin.     Allergies:   Alendronate sodium and Pregabalin   ROS:   Please see the history of present illness.     All other systems reviewed and are negative.   Labs/Other Tests and Data Reviewed:    Recent Labs: 10/25/2020: TSH 1.63 06/08/2021: ALT 16; BUN 10; Creatinine, Ser 0.77; Hemoglobin 13.2; Platelets 244; Potassium 4.2; Sodium 142   Recent Lipid Panel Lab Results  Component Value Date/Time   CHOL 179 06/08/2021 10:44 AM   TRIG 100 06/08/2021 10:44 AM   HDL 71 06/08/2021 10:44 AM   CHOLHDL 2.5 06/08/2021 10:44 AM   CHOLHDL 3.1 04/24/2020 11:21 AM   LDLCALC 90 06/08/2021 10:44 AM   LDLCALC 109 (H) 04/24/2020 11:21 AM    Wt Readings from Last 3 Encounters:  06/08/21 160 lb 0.6 oz (72.6 kg)  05/02/21 158 lb (71.7 kg)  02/15/21 155 lb 9.6 oz (70.6 kg)     ASSESSMENT & PLAN:    Nausea Zofran as needed Avoid hot and spicy food Maintain adequate hydration by taking at least 64 ounces of fluid in a day Advised to try Pepcid as needed for acid reflux  Osteoporosis with pathological fracture with routine healing History of compression fracture of thoracic and lumbar vertebra s/p kyphoplasty Did not tolerate bisphosphonate Last DEXA scan reviewed -would prefer to start Prolia, wants to discuss in the next visit Continue calcium and vitamin D supplements  Panlobular emphysema (HCC) Well-controlled with Advair and as needed albuterol Has portable O2 as needed  Time:   Today, I have spent 18 minutes reviewing the chart, including problem list, medications, and with the patient with telehealth technology discussing the above problems.   Medication Adjustments/Labs and Tests Ordered: Current medicines are reviewed at length with the patient today.  Concerns regarding medicines are outlined above.    Tests Ordered: No orders of the defined types were placed in this encounter.   Medication Changes: No orders of the defined types were placed in this encounter.    Note: This dictation was prepared with Dragon dictation along with smaller phrase technology. Similar sounding words can be transcribed inadequately or may not be corrected upon review. Any transcriptional errors that result from this process are unintentional.      Disposition:  Follow up  Signed, Anabel Halon, MD  09/07/2021 11:17 AM     [facility]

## 2021-09-07 NOTE — Assessment & Plan Note (Signed)
Well-controlled with Advair and as needed albuterol Has portable O2 as needed

## 2021-09-18 DIAGNOSIS — J9610 Chronic respiratory failure, unspecified whether with hypoxia or hypercapnia: Secondary | ICD-10-CM | POA: Diagnosis not present

## 2021-09-18 DIAGNOSIS — J449 Chronic obstructive pulmonary disease, unspecified: Secondary | ICD-10-CM | POA: Diagnosis not present

## 2021-09-30 DIAGNOSIS — J449 Chronic obstructive pulmonary disease, unspecified: Secondary | ICD-10-CM | POA: Diagnosis not present

## 2021-10-19 DIAGNOSIS — J449 Chronic obstructive pulmonary disease, unspecified: Secondary | ICD-10-CM | POA: Diagnosis not present

## 2021-10-31 DIAGNOSIS — J449 Chronic obstructive pulmonary disease, unspecified: Secondary | ICD-10-CM | POA: Diagnosis not present

## 2021-11-16 DIAGNOSIS — J449 Chronic obstructive pulmonary disease, unspecified: Secondary | ICD-10-CM | POA: Diagnosis not present

## 2021-11-28 DIAGNOSIS — J449 Chronic obstructive pulmonary disease, unspecified: Secondary | ICD-10-CM | POA: Diagnosis not present

## 2021-12-17 DIAGNOSIS — J449 Chronic obstructive pulmonary disease, unspecified: Secondary | ICD-10-CM | POA: Diagnosis not present

## 2021-12-29 DIAGNOSIS — J449 Chronic obstructive pulmonary disease, unspecified: Secondary | ICD-10-CM | POA: Diagnosis not present

## 2022-01-07 ENCOUNTER — Ambulatory Visit: Payer: Medicare Other | Admitting: Internal Medicine

## 2022-01-08 ENCOUNTER — Encounter: Payer: Self-pay | Admitting: Internal Medicine

## 2022-01-08 ENCOUNTER — Ambulatory Visit (INDEPENDENT_AMBULATORY_CARE_PROVIDER_SITE_OTHER): Payer: Medicare Other | Admitting: Internal Medicine

## 2022-01-08 VITALS — BP 138/82 | HR 77 | Resp 18 | Ht 62.0 in | Wt 165.2 lb

## 2022-01-08 DIAGNOSIS — J9611 Chronic respiratory failure with hypoxia: Secondary | ICD-10-CM | POA: Diagnosis not present

## 2022-01-08 DIAGNOSIS — M8000XD Age-related osteoporosis with current pathological fracture, unspecified site, subsequent encounter for fracture with routine healing: Secondary | ICD-10-CM | POA: Diagnosis not present

## 2022-01-08 DIAGNOSIS — E782 Mixed hyperlipidemia: Secondary | ICD-10-CM

## 2022-01-08 DIAGNOSIS — I700 Atherosclerosis of aorta: Secondary | ICD-10-CM

## 2022-01-08 DIAGNOSIS — J431 Panlobular emphysema: Secondary | ICD-10-CM

## 2022-01-08 DIAGNOSIS — J0100 Acute maxillary sinusitis, unspecified: Secondary | ICD-10-CM

## 2022-01-08 MED ORDER — BREZTRI AEROSPHERE 160-9-4.8 MCG/ACT IN AERO: 2.0000 | INHALATION_SPRAY | Freq: Two times a day (BID) | RESPIRATORY_TRACT | 11 refills | Status: DC

## 2022-01-08 MED ORDER — AZITHROMYCIN 250 MG PO TABS: ORAL_TABLET | ORAL | 0 refills | Status: AC

## 2022-01-08 NOTE — Assessment & Plan Note (Signed)
Uncontrolled with Advair, switched to [place] ?Uses as needed albuterol ?Has portable O2 as needed ?

## 2022-01-08 NOTE — Assessment & Plan Note (Signed)
History of compression fracture of thoracic and lumbar vertebra s/p kyphoplasty ?Did not tolerate bisphosphonate ?Last DEXA scan reviewed -would prefer to start Prolia or Forteo - referred to Endocrinology ?Continue calcium and vitamin D supplements ?

## 2022-01-08 NOTE — Assessment & Plan Note (Signed)
Due to COPD, has quit smoking more than 10 years ago ?Has portable O2 as needed ?

## 2022-01-08 NOTE — Assessment & Plan Note (Signed)
Noted on CT chest On aspirin and statin 

## 2022-01-08 NOTE — Progress Notes (Signed)
? ?Established Patient Office Visit ? ?Subjective:  ?Patient ID: Abigail Gill, female    DOB: 1961-02-23  Age: 60 y.o. MRN: [id number] ? ?CC:  ?Chief Complaint  ?Patient presents with  ? Follow-up  ?  4 month follow up osteoporosis pt also thinks she has sinus infection has had sore throat for a week   ? ? ?HPI ?Abigail Gill is a 61 y.o. female with past medical history of atherosclerosis of aorta, HLD, COPD, osteoporosis with history of thoracic and lumbar compression fractures and environmental allergies who presents for f/u of her chronic medical conditions. ? ?She has history of osteoporosis and compression fractures of thoracic and lumbar vertebra.  She did not tolerate alendronate in the past.  Her last DEXA scan showed persistent osteoporosis, for which she is currently only taking calcium and vitamin D supplement.  She was not able to afford Prolia. ? ?COPD: She has to use her albuterol frequently despite using Advair on a regular basis.  She also uses O2 as needed for dyspnea. ? ?She complains of sore throat, nasal congestion and postnasal drip for the last 1 week.  She denies any fever, chills or recent worsening of dyspnea or wheezing. ? ? ? ? ?Past Medical History:  ?Diagnosis Date  ? Aortic atherosclerosis (HCC)   ? Asthma   ? Back pain   ? Complex regional pain syndrome type 1 of left upper extremity 03/14/2020  ? COPD (chronic obstructive pulmonary disease) (HCC)   ? Former cigarette smoker 07/07/2017  ? GERD (gastroesophageal reflux disease)   ? Hyperlipidemia 02/15/2021  ? Osteoporosis   ? Shortness of breath   ? with exertion  ? Vitamin D deficiency   ? ? ?Past Surgical History:  ?Procedure Laterality Date  ? BREAST BIOPSY Left   ? benign  ? BREAST ENHANCEMENT SURGERY  1996  ? bilateral-[place]  ? BREAST EXCISIONAL BIOPSY Left   ? benign  ? BREAST LUMPECTOMY  08/30/2011  ? Procedure: LUMPECTOMY;  Surgeon: Fabio Bering;  Location: [facility];  Service: General;  Laterality: Left;  ? KYPHOPLASTY   2018  ? TUBAL LIGATION  1985  ? [name]  ? ? ?Family History  ?Problem Relation Age of Onset  ? Alcohol abuse Mother   ? Arthritis Mother   ? COPD Mother   ? Hyperlipidemia Mother   ? Pancreatitis Mother   ? Heart failure Father   ? Alcohol abuse Father   ? Arthritis Father   ? Diabetes Father   ? Heart disease Father   ?     several heart attacks  ? Hyperlipidemia Father   ? Hypertension Father   ? Kidney disease Father   ? Stroke Father   ?     several strokes  ? COPD Sister   ? Hypertension Sister   ? Anesthesia problems Neg Hx   ? Hypotension Neg Hx   ? Malignant hyperthermia Neg Hx   ? Pseudochol deficiency Neg Hx   ? ? ?Social History  ? ?Socioeconomic History  ? Marital status: Married  ?  Spouse name: [name]  ? Number of children: 3  ? Years of education: 49  ? Highest education level: Not on file  ?Occupational History  ? Occupation: disabled  ?  Comment: COPD  ?Tobacco Use  ? Smoking status: Former  ?  Packs/day: 1.00  ?  Years: 30.00  ?  Pack years: 30.00  ?  Types: Cigarettes  ?  Start date: 09/16/1976  ?  Quit date: 04/18/2009  ?  Years since quitting: 12.7  ? Smokeless tobacco: Never  ?Vaping Use  ? Vaping Use: Never used  ?Substance and Sexual Activity  ? Alcohol use: No  ?  Alcohol/week: 0.0 standard drinks  ? Drug use: No  ? Sexual activity: Yes  ?Other Topics Concern  ? Not on file  ?Social History Narrative  ? Disabled due to COPD  ? Was a pharmacy tech  ? Lives at home with  Husband,[name] .Married for 19 years.  ? 1 chihuahua.  ? ?Social Determinants of Health  ? ?Financial Resource Strain: Low Risk   ? Difficulty of Paying Living Expenses: Not hard at all  ?Food Insecurity: No Food Insecurity  ? Worried About Programme researcher, broadcasting/film/video in the Last Year: Never true  ? Ran Out of Food in the Last Year: Never true  ?Transportation Needs: No Transportation Needs  ? Lack of Transportation (Medical): No  ? Lack of Transportation (Non-Medical): No  ?Physical Activity: Inactive  ? Days of Exercise per Week:  0 days  ? Minutes of Exercise per Session: 0 min  ?Stress: No Stress Concern Present  ? Feeling of Stress : Not at all  ?Social Connections: Unknown  ? Frequency of Communication with Friends and Family: More than three times a week  ? Frequency of Social Gatherings with Friends and Family: Once a week  ? Attends Religious Services: Not on file  ? Active Member of Clubs or Organizations: No  ? Attends Banker Meetings: Never  ? Marital Status: Married  ?Intimate Partner Violence: Not At Risk  ? Fear of Current or Ex-Partner: No  ? Emotionally Abused: No  ? Physically Abused: No  ? Sexually Abused: No  ? ? ?Outpatient Medications Prior to Visit  ?Medication Sig Dispense Refill  ? albuterol (VENTOLIN HFA) 108 (90 Base) MCG/ACT inhaler Inhale 2 puffs into the lungs every 4 (four) hours as needed. For shortness of breath 18 g 2  ? aspirin 81 MG chewable tablet Chew 1 tablet by mouth daily.    ? atorvastatin (LIPITOR) 10 MG tablet Take 1 tablet (10 mg total) by mouth at bedtime. 90 tablet 3  ? cetirizine (ZYRTEC) 10 MG tablet Take 10 mg by mouth at bedtime.    ? cholecalciferol (VITAMIN D3) 25 MCG (1000 UT) tablet Take 10,000 Units by mouth daily.     ? ondansetron (ZOFRAN) 4 MG tablet Take 1 tablet (4 mg total) by mouth every 8 (eight) hours as needed for nausea or vomiting. 20 tablet 0  ? PROLIA 60 MG/ML SOSY injection Inject into the skin.    ? fluticasone-salmeterol (ADVAIR) 250-50 MCG/ACT AEPB Inhale 1 puff into the lungs in the morning and at bedtime. 60 each 1  ? oxyCODONE-acetaminophen (PERCOCET) 7.5-325 MG tablet Take 1 tablet by mouth every 4 (four) hours as needed for severe pain. (Patient not taking: Reported on 01/08/2022)    ? ?No facility-administered medications prior to visit.  ? ? ?Allergies  ?Allergen Reactions  ? Alendronate Sodium Nausea And Vomiting  ?  Vomiting within minutes of taking ?Vomiting within minutes of taking: Can take with crackers or food and can keep it down  ? Pregabalin  Other (See Comments)  ?  Headaches  ? ? ?ROS ?Review of Systems  ?Constitutional:  Negative for chills and fever.  ?HENT:  Positive for congestion, sinus pressure and sore throat.   ?Eyes:  Negative for pain and discharge.  ?Respiratory:  Positive for  cough and shortness of breath.   ?Cardiovascular:  Negative for chest pain and palpitations.  ?Gastrointestinal:  Negative for abdominal pain, diarrhea, nausea and vomiting.  ?Endocrine: Negative for polydipsia and polyuria.  ?Genitourinary:  Negative for dysuria and hematuria.  ?Musculoskeletal:  Positive for arthralgias, back pain and gait problem. Negative for neck pain and neck stiffness.  ?Skin:  Negative for rash.  ?Neurological:  Negative for dizziness and weakness.  ?Psychiatric/Behavioral:  Negative for agitation and behavioral problems.   ? ?  ?Objective:  ?  ?Physical Exam ?Vitals reviewed.  ?Constitutional:   ?   General: She is not in acute distress. ?   Appearance: She is not diaphoretic.  ?HENT:  ?   Head: Normocephalic and atraumatic.  ?   Nose: Congestion present.  ?   Mouth/Throat:  ?   Mouth: Mucous membranes are moist.  ?Eyes:  ?   General: No scleral icterus. ?   Extraocular Movements: Extraocular movements intact.  ?Cardiovascular:  ?   Rate and Rhythm: Normal rate and regular rhythm.  ?   Pulses: Normal pulses.  ?   Heart sounds: Normal heart sounds. No murmur heard. ?Pulmonary:  ?   Breath sounds: Normal breath sounds. No wheezing or rales.  ?Musculoskeletal:  ?   Cervical back: Neck supple. No tenderness.  ?   Right lower leg: No edema.  ?   Left lower leg: No edema.  ?Skin: ?   General: Skin is warm.  ?   Findings: No rash.  ?Neurological:  ?   General: No focal deficit present.  ?   Mental Status: She is alert and oriented to person, place, and time.  ?Psychiatric:     ?   Mood and Affect: Mood normal.     ?   Behavior: Behavior normal.  ? ? ?BP 138/82 (BP Location: Right Arm, Patient Position: Sitting, Cuff Size: Normal)   Pulse 77   Resp  18   Ht 5\' 2"  (1.575 m)   Wt 165 lb 3.2 oz (74.9 kg)   SpO2 91%   BMI 30.22 kg/m?  ?Wt Readings from Last 3 Encounters:  ?01/08/22 165 lb 3.2 oz (74.9 kg)  ?06/08/21 160 lb 0.6 oz (72.6 kg)  ?05/02/21

## 2022-01-08 NOTE — Patient Instructions (Signed)
Please continue taking medications as prescribed.  Please continue to follow low salt diet and ambulate as tolerated.  Please consider getting Shingrix vaccine at your local pharmacy. 

## 2022-01-08 NOTE — Assessment & Plan Note (Signed)
On statin.

## 2022-01-16 DIAGNOSIS — J449 Chronic obstructive pulmonary disease, unspecified: Secondary | ICD-10-CM | POA: Diagnosis not present

## 2022-01-28 DIAGNOSIS — J449 Chronic obstructive pulmonary disease, unspecified: Secondary | ICD-10-CM | POA: Diagnosis not present

## 2022-02-12 ENCOUNTER — Other Ambulatory Visit (HOSPITAL_COMMUNITY)
Admission: RE | Admit: 2022-02-12 | Discharge: 2022-02-12 | Disposition: A | Discharge status: Home / Self Care | Payer: Medicare Other | Source: Ambulatory Visit | Attending: Family Medicine | Admitting: Family Medicine

## 2022-02-12 ENCOUNTER — Encounter: Payer: Self-pay | Admitting: Family Medicine

## 2022-02-12 ENCOUNTER — Ambulatory Visit (INDEPENDENT_AMBULATORY_CARE_PROVIDER_SITE_OTHER): Payer: Medicare Other | Admitting: Family Medicine

## 2022-02-12 VITALS — BP 116/74 | HR 72 | Ht 62.0 in | Wt 164.4 lb

## 2022-02-12 DIAGNOSIS — Z01419 Encounter for gynecological examination (general) (routine) without abnormal findings: Secondary | ICD-10-CM | POA: Insufficient documentation

## 2022-02-12 DIAGNOSIS — Z1151 Encounter for screening for human papillomavirus (HPV): Secondary | ICD-10-CM | POA: Diagnosis not present

## 2022-02-12 DIAGNOSIS — Z124 Encounter for screening for malignant neoplasm of cervix: Secondary | ICD-10-CM

## 2022-02-12 NOTE — Patient Instructions (Signed)
I appreciate the opportunity to provide care to you today!   - we will contact you inform you of your pap smear results    Please continue to a heart-healthy diet and increase your physical activities. Try to exercise for at least three times a week.      It was a pleasure to see you and I look forward to continuing to work together on your health and well-being. Please do not hesitate to call the office if you need care or have questions about your care.   Have a wonderful day and week. With Gratitude, Gilmore Laroche MSN, FNP-BC

## 2022-02-12 NOTE — Progress Notes (Signed)
Established Patient Office Visit  Subjective:  Patient ID: Abigail Gill, female    DOB: [birth year]  Age: 61 y.o. MRN: [id number]  CC:  Chief Complaint  Patient presents with   Gynecologic Exam    Pt is here to have pap smear. Last pap smear was normal.     HPI Abigail Gill is a 61 y.o. postmenopausal female with past medical history of hyperlipidemia and aortic atherosclerosis present for a pap smear. She declined STD screening today.     Past Medical History:  Diagnosis Date   Aortic atherosclerosis ([place])    Asthma    Back pain    Complex regional pain syndrome type 1 of left upper extremity 03/14/2020   COPD (chronic obstructive pulmonary disease) (HCC)    Former cigarette smoker 07/07/2017   GERD (gastroesophageal reflux disease)    Hyperlipidemia 02/15/2021   Osteoporosis    Shortness of breath    with exertion   Vitamin D deficiency     Past Surgical History:  Procedure Laterality Date   BREAST BIOPSY Left    benign   BREAST ENHANCEMENT SURGERY  1996   bilateral-[place]   BREAST EXCISIONAL BIOPSY Left    benign   BREAST LUMPECTOMY  08/30/2011   Procedure: LUMPECTOMY;  Surgeon: Donato Heinz;  Location: [facility];  Service: General;  Laterality: Left;   KYPHOPLASTY  2018   [place]    Family History  Problem Relation Age of Onset   Alcohol abuse Mother    Arthritis Mother    COPD Mother    Hyperlipidemia Mother    Pancreatitis Mother    Heart failure Father    Alcohol abuse Father    Arthritis Father    Diabetes Father    Heart disease Father        several heart attacks   Hyperlipidemia Father    Hypertension Father    Kidney disease Father    Stroke Father        several strokes   COPD Sister    Hypertension Sister    Anesthesia problems Neg Hx    Hypotension Neg Hx    Malignant hyperthermia Neg Hx    Pseudochol deficiency Neg Hx     Social History   Socioeconomic History   Marital status: Married     Spouse name: [name]   Number of children: 3   Years of education: 11   Highest education level: Not on file  Occupational History   Occupation: disabled    Comment: COPD  Tobacco Use   Smoking status: Former    Packs/day: 1.00    Years: 30.00    Pack years: 30.00    Types: Cigarettes    Start date: 09/16/1976    Quit date: 04/18/2009    Years since quitting: 12.8   Smokeless tobacco: Never  Vaping Use   Vaping Use: Never used  Substance and Sexual Activity   Alcohol use: No    Alcohol/week: 0.0 standard drinks   Drug use: No   Sexual activity: Yes  Other Topics Concern   Not on file  Social History Narrative   Disabled due to COPD   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   1 chihuahua.   Social Determinants of Health   Financial Resource Strain: Low Risk    Difficulty of Paying Living Expenses: Not hard at all  Food Insecurity:  No Food Insecurity   Worried About Charity fundraiser in the Last Year: Never true   Ran Out of Food in the Last Year: Never true  Transportation Needs: No Transportation Needs   Lack of Transportation (Medical): No   Lack of Transportation (Non-Medical): No  Physical Activity: Inactive   Days of Exercise per Week: 0 days   Minutes of Exercise per Session: 0 min  Stress: No Stress Concern Present   Feeling of Stress : Not at all  Social Connections: Unknown   Frequency of Communication with Friends and Family: More than three times a week   Frequency of Social Gatherings with Friends and Family: Once a week   Attends Religious Services: Not on Electrical engineer or Organizations: No   Attends Archivist Meetings: Never   Marital Status: Married  Human resources officer Violence: Not At Risk   Fear of Current or Ex-Partner: No   Emotionally Abused: No   Physically Abused: No   Sexually Abused: No    Outpatient Medications Prior to Visit  Medication Sig Dispense Refill    albuterol (VENTOLIN HFA) 108 (90 Base) MCG/ACT inhaler Inhale 2 puffs into the lungs every 4 (four) hours as needed. For shortness of breath 18 g 2   aspirin 81 MG chewable tablet Chew 1 tablet by mouth daily.     atorvastatin (LIPITOR) 10 MG tablet Take 1 tablet (10 mg total) by mouth at bedtime. 90 tablet 3   Budeson-Glycopyrrol-Formoterol (BREZTRI AEROSPHERE) 160-9-4.8 MCG/ACT AERO Inhale 2 puffs into the lungs 2 (two) times daily. 10.7 g 11   cetirizine (ZYRTEC) 10 MG tablet Take 10 mg by mouth at bedtime.     cholecalciferol (VITAMIN D3) 25 MCG (1000 UT) tablet Take 10,000 Units by mouth daily.      ondansetron (ZOFRAN) 4 MG tablet Take 1 tablet (4 mg total) by mouth every 8 (eight) hours as needed for nausea or vomiting. 20 tablet 0   PROLIA 60 MG/ML SOSY injection Inject into the skin.     No facility-administered medications prior to visit.    Allergies  Allergen Reactions   Alendronate Sodium Nausea And Vomiting    Vomiting within minutes of taking Vomiting within minutes of taking: Can take with crackers or food and can keep it down   Pregabalin Other (See Comments)    Headaches    ROS Review of Systems  Constitutional:  Negative for diaphoresis, fatigue and fever.  HENT:  Negative for rhinorrhea, sinus pressure and sinus pain.   Eyes:  Negative for photophobia, redness and visual disturbance.  Respiratory:  Negative for shortness of breath, wheezing and stridor.   Cardiovascular:  Negative for chest pain and palpitations.  Gastrointestinal:  Negative for constipation, nausea and rectal pain.  Endocrine: Negative for polydipsia, polyphagia and polyuria.  Genitourinary:  Negative for pelvic pain, vaginal discharge and vaginal pain.  Musculoskeletal:  Negative for back pain and neck pain.  Skin:  Negative for rash and wound.  Neurological:  Negative for tremors, light-headedness and numbness.  Hematological:  Does not bruise/bleed easily.  Psychiatric/Behavioral:  Negative  for confusion and sleep disturbance.      Objective:    Physical Exam Constitutional:      Appearance: Normal appearance.  HENT:     Head: Normocephalic.     Right Ear: External ear normal.     Left Ear: External ear normal.  Cardiovascular:     Rate and Rhythm: Normal rate and  regular rhythm.     Pulses: Normal pulses.     Heart sounds: Normal heart sounds.  Pulmonary:     Effort: Pulmonary effort is normal.     Breath sounds: Normal breath sounds.  Genitourinary:    Exam position: Lithotomy position.     Tanner stage (genital): 5.     Labia:        Right: No rash, tenderness, lesion or injury.        Left: No rash, tenderness, lesion or injury.      Comments: Vaginal wall: pink and rugated, smooth and non-tender; absence of lesions, edema, and erythema. Labia Majora and Minora: present bilaterally, moist, soft tissue, and homogeneous; free of edema and ulcerations. Clitoris is anatomically present, above the urethral, and free of lesions, masses, and ulceration.    Neurological:     Mental Status: She is alert.  Psychiatric:     Comments: Normal affect    BP 116/74   Pulse 72   Ht 5' 2" (1.575 m)   Wt 164 lb 6.4 oz (74.6 kg)   SpO2 93%   BMI 30.07 kg/m  Wt Readings from Last 3 Encounters:  02/12/22 164 lb 6.4 oz (74.6 kg)  01/08/22 165 lb 3.2 oz (74.9 kg)  06/08/21 160 lb 0.6 oz (72.6 kg)    Lab Results  Component Value Date   TSH 1.63 10/25/2020   Lab Results  Component Value Date   WBC 6.0 06/08/2021   HGB 13.2 06/08/2021   HCT 38.8 06/08/2021   MCV 87 06/08/2021   PLT 244 06/08/2021   Lab Results  Component Value Date   NA 142 06/08/2021   K 4.2 06/08/2021   CO2 24 06/08/2021   GLUCOSE 89 06/08/2021   BUN 10 06/08/2021   CREATININE 0.77 06/08/2021   BILITOT 0.4 06/08/2021   ALKPHOS 114 06/08/2021   AST 17 06/08/2021   ALT 16 06/08/2021   PROT 6.9 06/08/2021   ALBUMIN 4.7 06/08/2021   CALCIUM 9.7 06/08/2021   ANIONGAP 11 12/26/2017   EGFR  87 06/08/2021   Lab Results  Component Value Date   CHOL 179 06/08/2021   Lab Results  Component Value Date   HDL 71 06/08/2021   Lab Results  Component Value Date   LDLCALC 90 06/08/2021   Lab Results  Component Value Date   TRIG 100 06/08/2021   Lab Results  Component Value Date   CHOLHDL 2.5 06/08/2021   Lab Results  Component Value Date   HGBA1C 5.8 (H) 10/25/2020      Assessment & Plan:   Problem List Items Addressed This Visit       Other   Cervical cancer screening - Primary    - According to the [employer] (ACS), cytology and HPV co-testing (preferred) every 5 years or cytology alone (acceptable) every 3 years. -  ACS recommends stopping screening if the patient is >65 and has had 3 consecutive negative cytology results or 2 consecutive co-testing results within the previous 10 years and the most recent test within the past 5 years.        Relevant Orders   Cytology - PAP    No orders of the defined types were placed in this encounter.   Follow-up: No follow-ups on file.    Alvira Monday, FNP

## 2022-02-12 NOTE — Assessment & Plan Note (Addendum)
-   According to the American Cancer Society (ACS), cytology and HPV co-testing (preferred) every 5 years or cytology alone (acceptable) every 3 years. -  ACS recommends stopping screening if the patient is >65 and has had 3 consecutive negative cytology results or 2 consecutive co-testing results within the previous 10 years and the most recent test within the past 5 years.

## 2022-02-14 LAB — CYTOLOGY - PAP
Adequacy: ABSENT
Comment: NEGATIVE
Diagnosis: NEGATIVE
High risk HPV: NEGATIVE

## 2022-02-14 NOTE — Progress Notes (Signed)
Please inform the patient that her pap test came back negative.

## 2022-02-16 DIAGNOSIS — J449 Chronic obstructive pulmonary disease, unspecified: Secondary | ICD-10-CM | POA: Diagnosis not present

## 2022-02-28 DIAGNOSIS — J449 Chronic obstructive pulmonary disease, unspecified: Secondary | ICD-10-CM | POA: Diagnosis not present

## 2022-03-07 ENCOUNTER — Ambulatory Visit: Payer: Self-pay | Admitting: "Endocrinology

## 2022-03-18 DIAGNOSIS — J449 Chronic obstructive pulmonary disease, unspecified: Secondary | ICD-10-CM | POA: Diagnosis not present

## 2022-03-30 DIAGNOSIS — J449 Chronic obstructive pulmonary disease, unspecified: Secondary | ICD-10-CM | POA: Diagnosis not present

## 2022-04-18 DIAGNOSIS — J449 Chronic obstructive pulmonary disease, unspecified: Secondary | ICD-10-CM | POA: Diagnosis not present

## 2022-04-30 DIAGNOSIS — J449 Chronic obstructive pulmonary disease, unspecified: Secondary | ICD-10-CM | POA: Diagnosis not present

## 2022-05-19 DIAGNOSIS — J449 Chronic obstructive pulmonary disease, unspecified: Secondary | ICD-10-CM | POA: Diagnosis not present

## 2022-05-31 DIAGNOSIS — J449 Chronic obstructive pulmonary disease, unspecified: Secondary | ICD-10-CM | POA: Diagnosis not present

## 2022-06-07 ENCOUNTER — Other Ambulatory Visit: Payer: Self-pay | Admitting: Cardiology

## 2022-06-07 DIAGNOSIS — I700 Atherosclerosis of aorta: Secondary | ICD-10-CM

## 2022-06-07 DIAGNOSIS — E785 Hyperlipidemia, unspecified: Secondary | ICD-10-CM

## 2022-06-13 ENCOUNTER — Encounter: Payer: Self-pay | Admitting: Nurse Practitioner

## 2022-06-13 ENCOUNTER — Ambulatory Visit (INDEPENDENT_AMBULATORY_CARE_PROVIDER_SITE_OTHER): Payer: Medicare Other | Admitting: Nurse Practitioner

## 2022-06-13 DIAGNOSIS — Z0000 Encounter for general adult medical examination without abnormal findings: Secondary | ICD-10-CM | POA: Diagnosis not present

## 2022-06-13 DIAGNOSIS — Z122 Encounter for screening for malignant neoplasm of respiratory organs: Secondary | ICD-10-CM

## 2022-06-13 NOTE — Progress Notes (Signed)
I connected with  Abigail Gill on 06/13/22 by a audio enabled telemedicine application and verified that I am speaking with the correct person using two identifiers.  Patient Location: Home  Provider Location: Home Office  I discussed the limitations of evaluation and management by telemedicine. The patient expressed understanding and agreed to proceed.  Subjective:   Abigail Gill is a 61 y.o. female who presents for Medicare Annual (Subsequent) preventive examination.  Review of Systems           Objective:    There were no vitals filed for this visit. There is no height or weight on file to calculate BMI.     06/12/2021    4:09 PM 02/11/2020   10:05 AM 08/26/2011    8:03 AM  Advanced Directives  Does Patient Have a Medical Advance Directive? No No Patient does not have advance directive;Patient would not like information  Would patient like information on creating a medical advance directive?  No - Patient declined   Pre-existing out of facility DNR order (yellow form or pink MOST form)   No    Current Medications (verified) Outpatient Encounter Medications as of 06/13/2022  Medication Sig   albuterol (VENTOLIN HFA) 108 (90 Base) MCG/ACT inhaler Inhale 2 puffs into the lungs every 4 (four) hours as needed. For shortness of breath   aspirin 81 MG chewable tablet Chew 1 tablet by mouth daily.   atorvastatin (LIPITOR) 10 MG tablet TAKE 1 TABLET BY MOUTH AT BEDTIME   Budeson-Glycopyrrol-Formoterol (BREZTRI AEROSPHERE) 160-9-4.8 MCG/ACT AERO Inhale 2 puffs into the lungs 2 (two) times daily.   cetirizine (ZYRTEC) 10 MG tablet Take 10 mg by mouth at bedtime.   cholecalciferol (VITAMIN D3) 25 MCG (1000 UT) tablet Take 10,000 Units by mouth daily.    ondansetron (ZOFRAN) 4 MG tablet Take 1 tablet (4 mg total) by mouth every 8 (eight) hours as needed for nausea or vomiting.   PROLIA 60 MG/ML SOSY injection Inject into the skin.   [DISCONTINUED] atorvastatin (LIPITOR) 10 MG tablet  Take 1 tablet (10 mg total) by mouth at bedtime.   No facility-administered encounter medications on file as of 06/13/2022.    Allergies (verified) Alendronate sodium and Pregabalin   History: Past Medical History:  Diagnosis Date   Aortic atherosclerosis (HCC)    Asthma    Back pain    Complex regional pain syndrome type 1 of left upper extremity 03/14/2020   COPD (chronic obstructive pulmonary disease) (HCC)    Former cigarette smoker 07/07/2017   GERD (gastroesophageal reflux disease)    Hyperlipidemia 02/15/2021   Osteoporosis    Shortness of breath    with exertion   Vitamin D deficiency    Past Surgical History:  Procedure Laterality Date   BREAST BIOPSY Left    benign   BREAST ENHANCEMENT SURGERY  1996   bilateral-[place]   BREAST EXCISIONAL BIOPSY Left    benign   BREAST LUMPECTOMY  08/30/2011   Procedure: LUMPECTOMY;  Surgeon: Fabio Bering;  Location: [facility];  Service: General;  Laterality: Left;   KYPHOPLASTY  2018   TUBAL LIGATION  1985   [place]   Family History  Problem Relation Age of Onset   Alcohol abuse Mother    Arthritis Mother    COPD Mother    Hyperlipidemia Mother    Pancreatitis Mother    Heart failure Father    Alcohol abuse Father    Arthritis Father    Diabetes Father  Heart disease Father        several heart attacks   Hyperlipidemia Father    Hypertension Father    Kidney disease Father    Stroke Father        several strokes   COPD Sister    Hypertension Sister    Anesthesia problems Neg Hx    Hypotension Neg Hx    Malignant hyperthermia Neg Hx    Pseudochol deficiency Neg Hx    Social History   Socioeconomic History   Marital status: Married    Spouse name: [name]   Number of children: 3   Years of education: 11   Highest education level: Not on file  Occupational History   Occupation: disabled    Comment: COPD  Tobacco Use   Smoking status: Former    Packs/day: 1.00    Years: 30.00    Total pack  years: 30.00    Types: Cigarettes    Start date: 09/16/1976    Quit date: 04/18/2009    Years since quitting: 13.1   Smokeless tobacco: Never  Vaping Use   Vaping Use: Never used  Substance and Sexual Activity   Alcohol use: No    Alcohol/week: 0.0 standard drinks of alcohol   Drug use: No   Sexual activity: Yes  Other Topics Concern   Not on file  Social History Narrative   Disabled due to COPD   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   1 chihuahua.   Social Determinants of Health   Financial Resource Strain: Low Risk  (06/12/2021)   Overall Financial Resource Strain (CARDIA)    Difficulty of Paying Living Expenses: Not hard at all  Food Insecurity: No Food Insecurity (06/12/2021)   Hunger Vital Sign    Worried About Running Out of Food in the Last Year: Never true    Ran Out of Food in the Last Year: Never true  Transportation Needs: No Transportation Needs (06/12/2021)   PRAPARE - Administrator, Civil Service (Medical): No    Lack of Transportation (Non-Medical): No  Physical Activity: Inactive (06/12/2021)   Exercise Vital Sign    Days of Exercise per Week: 0 days    Minutes of Exercise per Session: 0 min  Stress: No Stress Concern Present (06/12/2021)   [employer] of Occupational Health - Occupational Stress Questionnaire    Feeling of Stress : Not at all  Social Connections: Unknown (06/12/2021)   Social Connection and Isolation Panel [NHANES]    Frequency of Communication with Friends and Family: More than three times a week    Frequency of Social Gatherings with Friends and Family: Once a week    Attends Religious Services: Not on Marketing executive or Organizations: No    Attends Banker Meetings: Never    Marital Status: Married    Tobacco Counseling Counseling given: Not Answered   Clinical Intake:                 Diabetic?no         Activities of Daily  Living     No data to display          Patient Care Team: Anabel Halon, MD as PCP - General (Internal Medicine)  Indicate any recent Medical Services you may have received from other than [facility] providers in the past year (date may be approximate).  Assessment:   This is a routine wellness examination for [name].  Hearing/Vision screen No results found.  Dietary issues and exercise activities discussed:     Goals Addressed   None    Depression Screen    02/12/2022    9:29 AM 01/08/2022    9:32 AM 09/07/2021   10:50 AM 06/08/2021    9:59 AM 02/15/2021    8:28 AM 10/25/2020   10:58 AM 04/24/2020   10:48 AM  PHQ 2/9 Scores  PHQ - 2 Score 0 0 0 0 0 0 0  PHQ- 9 Score     0    Exception Documentation       Medical reason    Fall Risk    02/12/2022    9:29 AM 01/08/2022    9:32 AM 09/07/2021   10:50 AM 06/08/2021    9:59 AM 10/25/2020   10:58 AM  Fall Risk   Falls in the past year? 0 0  0 0  Number falls in past yr: 0 0 0 0 0  Injury with Fall? 0 0 0 0 0  Risk for fall due to : No Fall Risks No Fall Risks No Fall Risks No Fall Risks No Fall Risks  Follow up Falls evaluation completed Falls evaluation completed Falls evaluation completed Falls evaluation completed Falls evaluation completed    FALL RISK PREVENTION PERTAINING TO THE HOME:  Any stairs in or around the home? Yes  If so, are there any without handrails? No  Home free of loose throw rugs in walkways, pet beds, electrical cords, etc? Yes  Adequate lighting in your home to reduce risk of falls? Yes   ASSISTIVE DEVICES UTILIZED TO PREVENT FALLS:  Life alert? No  Use of a cane, walker or w/c? No  Grab bars in the bathroom? Yes  Shower chair or bench in shower? No  Elevated toilet seat or a handicapped toilet? yes  TIMED UP AND GO:    Cognitive Function:        06/12/2021    4:10 PM 02/11/2020   10:09 AM  6CIT Screen  What Year? 0 points 0 points  What month? 0 points 0 points  What time? 0  points 0 points  Count back from 20 0 points 0 points  Months in reverse 0 points 0 points  Repeat phrase 0 points 0 points  Total Score 0 points 0 points    Immunizations Immunization History  Administered Date(s) Administered   Influenza, Quadrivalent, Recombinant, Inj, Pf 06/25/2019   Influenza,inj,Quad PF,6+ Mos 06/29/2017, 07/13/2020, 06/08/2021   Influenza-Unspecified 06/29/2017, 06/25/2019   Janssen (J&J) SARS-COV-2 Vaccination 01/22/2020   Pneumococcal Conjugate-13 07/07/2017   Pneumococcal Polysaccharide-23 01/13/2020   Td 01/14/2017    TDAP status: Up to date  Due for flu vaccine, will get vaccine at her upcoming CPE  PNA vaccine , N/A  Covid-19 vaccine status: Information provided on how to obtain vaccines.   Qualifies for Shingles Vaccine? Yes   Zostavax completed  Shingrix Completed?: No.    Education has been provided regarding the importance of this vaccine. Patient has been advised to call insurance company to determine out of pocket expense if they have not yet received this vaccine. Advised may also receive vaccine at local pharmacy or Health Dept. Verbalized acceptance and understanding.  Screening Tests Health Maintenance  Topic Date Due   Zoster Vaccines- Shingrix (1 of 2) Never done   COVID-19 Vaccine (2 - Janssen risk series) 02/19/2020   INFLUENZA VACCINE  04/16/2022   MAMMOGRAM  06/16/2023   PAP SMEAR-Modifier  02/12/2025   COLONOSCOPY (Pts 45-21yrs Insurance coverage will need to be confirmed)  05/28/2026   TETANUS/TDAP  06/17/2027   Hepatitis C Screening  Completed   HIV Screening  Completed   HPV VACCINES  Aged Out    Health Maintenance  Health Maintenance Due  Topic Date Due   Zoster Vaccines- Shingrix (1 of 2) Never done   COVID-19 Vaccine (2 - Janssen risk series) 02/19/2020   INFLUENZA VACCINE  04/16/2022  UP to date with colonoscopy next due in 2027 Patient encouraged to get her diagnostic  mammogram and [place] of right breast done as  planned     Lung Cancer Screening: (Low Dose CT Chest recommended if Age 28-80 years, 30 pack-year currently smoking OR have quit w/in 15years.) does qualify.   Lung Cancer Screening Referral:   Additional Screening:  Hepatitis C Screening: does qualify; Completed yes  Vision Screening: Recommended annual ophthalmology exams for early detection of glaucoma and other disorders of the eye. Is the patient up to date with their annual eye exam?  No  Who is the provider or what is the name of the office in which the patient attends annual eye exams? None  If pt is not established with a provider, would they like to be referred to a provider to establish care? No . Will discuss with PCP at her upcoming appointment  Dental Screening: Recommended annual dental exams for proper oral hygiene  Community Resource Referral / Chronic Care Management: CRR required this visit?  No   CCM required this visit?  No      Plan:     I have personally reviewed and noted the following in the patient's chart:   Medical and social history Use of alcohol, tobacco or illicit drugs  Current medications and supplements including opioid prescriptions. Patient is not currently taking opioid prescriptions. Functional ability and status Nutritional status Physical activity Advanced directives List of other physicians Hospitalizations, surgeries, and ER visits in previous 12 months Vitals Screenings to include cognitive, depression, and falls Referrals and appointments  In addition, I have reviewed and discussed with patient certain preventive protocols, quality metrics, and best practice recommendations. A written personalized care plan for preventive services as well as general preventive health recommendations were provided to patient.     Donell Beers, FNP   06/13/2022   Nurse Notes:

## 2022-06-13 NOTE — Patient Instructions (Addendum)
  Ms. Mcferran , Thank you for taking time to come for your Medicare Wellness Visit. I appreciate your ongoing commitment to your health goals. Please review the following plan we discussed and let me know if I can assist you in the future.   These are the goals we discussed:  Please get the advanced directive forms from the office at your next appointment.   Goals      DIET - INCREASE WATER INTAKE     Exercise 150 minutes per week (moderate activity)     Exercise 5 X per week (30 min per time)        This is a list of the screening recommended for you and due dates:  Health Maintenance  Topic Date Due   Zoster (Shingles) Vaccine (1 of 2) Never done   COVID-19 Vaccine (2 - Janssen risk series) 02/19/2020   Flu Shot  04/16/2022   Mammogram  06/16/2023   Pap Smear  02/12/2025   Colon Cancer Screening  05/28/2026   Tetanus Vaccine  06/17/2027   Hepatitis C Screening: USPSTF Recommendation to screen - Ages 18-79 yo.  Completed   HIV Screening  Completed   HPV Vaccine  Aged Out

## 2022-06-14 NOTE — Addendum Note (Signed)
Addended by: Eual Fines on: 06/14/2022 01:29 PM   Modules accepted: Orders, Level of Service

## 2022-06-18 DIAGNOSIS — J449 Chronic obstructive pulmonary disease, unspecified: Secondary | ICD-10-CM | POA: Diagnosis not present

## 2022-06-30 DIAGNOSIS — J449 Chronic obstructive pulmonary disease, unspecified: Secondary | ICD-10-CM | POA: Diagnosis not present

## 2022-07-05 ENCOUNTER — Encounter: Payer: Self-pay | Admitting: Cardiology

## 2022-07-05 ENCOUNTER — Ambulatory Visit: Payer: Medicare Other | Attending: Cardiology | Admitting: Cardiology

## 2022-07-05 DIAGNOSIS — E785 Hyperlipidemia, unspecified: Secondary | ICD-10-CM

## 2022-07-05 DIAGNOSIS — I700 Atherosclerosis of aorta: Secondary | ICD-10-CM | POA: Diagnosis not present

## 2022-07-05 MED ORDER — ATORVASTATIN CALCIUM 10 MG PO TABS: 10.0000 mg | ORAL_TABLET | Freq: Every day | ORAL | 3 refills | Status: DC

## 2022-07-05 NOTE — Progress Notes (Signed)
Clinical Summary Abigail Gill is a 61 y.o.female seen today as a new patient. Last seen by our practice in 2017       1. Coronary atherosclerosis - noted by prior CT scan - sharp pain left sided, 2 total episodes last month. Both times occurred at times. Lasted just a few seconds. No other associated symptoms. Chronic SOB she attributes to COPD, she is on home O2, some progression SOB.    CAD risk factors: former smoker x 30 years, prediabetes  05/2021 nuclear stress: no ischemia - compliant with meds -upcoming labs with pcp - 05/2021 TC 179 TG 100 HDL 71 LDL 90 - no recent chest pains    2.COPD - followed by pcp Past Medical History:  Diagnosis Date   Aortic atherosclerosis ([place])    Asthma    Back pain    Complex regional pain syndrome type 1 of left upper extremity 03/14/2020   COPD (chronic obstructive pulmonary disease) (HCC)    Former cigarette smoker 07/07/2017   GERD (gastroesophageal reflux disease)    Hyperlipidemia 02/15/2021   Osteoporosis    Shortness of breath    with exertion   Vitamin D deficiency      Allergies  Allergen Reactions   Alendronate Sodium Nausea And Vomiting    Vomiting within minutes of taking Vomiting within minutes of taking: Can take with crackers or food and can keep it down   Pregabalin Other (See Comments)    Headaches     Current Outpatient Medications  Medication Sig Dispense Refill   albuterol (VENTOLIN HFA) 108 (90 Base) MCG/ACT inhaler Inhale 2 puffs into the lungs every 4 (four) hours as needed. For shortness of breath 18 g 2   aspirin 81 MG chewable tablet Chew 1 tablet by mouth daily.     atorvastatin (LIPITOR) 10 MG tablet TAKE 1 TABLET BY MOUTH AT BEDTIME 90 tablet 3   Budeson-Glycopyrrol-Formoterol (BREZTRI AEROSPHERE) 160-9-4.8 MCG/ACT AERO Inhale 2 puffs into the lungs 2 (two) times daily. 10.7 g 11   cetirizine (ZYRTEC) 10 MG tablet Take 10 mg by mouth at bedtime.     cholecalciferol (VITAMIN D3) 25 MCG (1000  UT) tablet Take 10,000 Units by mouth daily.      ondansetron (ZOFRAN) 4 MG tablet Take 1 tablet (4 mg total) by mouth every 8 (eight) hours as needed for nausea or vomiting. 20 tablet 0   PROLIA 60 MG/ML SOSY injection Inject into the skin.     No current facility-administered medications for this visit.     Past Surgical History:  Procedure Laterality Date   BREAST BIOPSY Left    benign   BREAST ENHANCEMENT SURGERY  1996   bilateral-[place]   BREAST EXCISIONAL BIOPSY Left    benign   BREAST LUMPECTOMY  08/30/2011   Procedure: LUMPECTOMY;  Surgeon: Donato Heinz;  Location: [facility];  Service: General;  Laterality: Left;   KYPHOPLASTY  2018   TUBAL LIGATION  1985   [place]     Allergies  Allergen Reactions   Alendronate Sodium Nausea And Vomiting    Vomiting within minutes of taking Vomiting within minutes of taking: Can take with crackers or food and can keep it down   Pregabalin Other (See Comments)    Headaches      Family History  Problem Relation Age of Onset   Alcohol abuse Mother    Arthritis Mother    COPD Mother    Hyperlipidemia Mother  Pancreatitis Mother    Heart failure Father    Alcohol abuse Father    Arthritis Father    Diabetes Father    Heart disease Father        several heart attacks   Hyperlipidemia Father    Hypertension Father    Kidney disease Father    Stroke Father        several strokes   COPD Sister    Hypertension Sister    Anesthesia problems Neg Hx    Hypotension Neg Hx    Malignant hyperthermia Neg Hx    Pseudochol deficiency Neg Hx      Social History Ms. Gunkel reports that she quit smoking about 13 years ago. Her smoking use included cigarettes. She started smoking about 45 years ago. She has a 30.00 pack-year smoking history. She has never used smokeless tobacco. Ms. Cinquemani reports no history of alcohol use.   Review of Systems CONSTITUTIONAL: No weight loss, fever, chills, weakness or fatigue.  HEENT:  Eyes: No visual loss, blurred vision, double vision or yellow sclerae.No hearing loss, sneezing, congestion, runny nose or sore throat.  SKIN: No rash or itching.  CARDIOVASCULAR: per hpi RESPIRATORY: No shortness of breath, cough or sputum.  GASTROINTESTINAL: No anorexia, nausea, vomiting or diarrhea. No abdominal pain or blood.  GENITOURINARY: No burning on urination, no polyuria NEUROLOGICAL: No headache, dizziness, syncope, paralysis, ataxia, numbness or tingling in the extremities. No change in bowel or bladder control.  MUSCULOSKELETAL: No muscle, back pain, joint pain or stiffness.  LYMPHATICS: No enlarged nodes. No history of splenectomy.  PSYCHIATRIC: No history of depression or anxiety.  ENDOCRINOLOGIC: No reports of sweating, cold or heat intolerance. No polyuria or polydipsia.  Marland Kitchen   Physical Examination Today's Vitals   07/05/22 0837  BP: 130/84  Pulse: 75  SpO2: 96%  Weight: 164 lb (74.4 kg)  Height: 5\' 2"  (1.575 m)   Body mass index is 30 kg/m.  Gen: resting comfortably, no acute distress HEENT: no scleral icterus, pupils equal round and reactive, no palptable cervical adenopathy,  CV: RRR, no m/r/g no jvd Resp: Clear to auscultation bilaterally GI: abdomen is soft, non-tender, non-distended, normal bowel sounds, no hepatosplenomegaly MSK: extremities are warm, no edema.  Skin: warm, no rash Neuro:  no focal deficits Psych: appropriate affect   Diagnostic Studies 04/2015 event monitor Tracings show normal sinus rhythm No symptoms reported No significant arrhythmias     01/2021 CT chest Tracings show normal sinus rhythm No symptoms reported No significant arrhythmias  05/2021 lexiscan   The study is normal. The study is low risk. There are no perfusion defects   No ST deviation was noted.   LV perfusion is normal.   Left ventricular function is normal.  Assessment and Plan   1. Coronary atherosclerosis - incidental findings on CT chest, LM and  multivessel atherosclerosis - nuclear stress without ischemia - on aspirin, statin - no recent symptoms - continue medical therapy, risk factor modification -EKG today shows NSR, no ischemic changes    F/u 1 year   06/2021, M.D.

## 2022-07-05 NOTE — Patient Instructions (Signed)

## 2022-07-15 ENCOUNTER — Ambulatory Visit (INDEPENDENT_AMBULATORY_CARE_PROVIDER_SITE_OTHER): Payer: Medicare Other | Admitting: Internal Medicine

## 2022-07-15 ENCOUNTER — Encounter: Payer: Self-pay | Admitting: Internal Medicine

## 2022-07-15 VITALS — BP 122/82 | HR 63 | Resp 18 | Ht 62.0 in | Wt 162.8 lb

## 2022-07-15 DIAGNOSIS — Z23 Encounter for immunization: Secondary | ICD-10-CM | POA: Diagnosis not present

## 2022-07-15 DIAGNOSIS — Z0001 Encounter for general adult medical examination with abnormal findings: Secondary | ICD-10-CM | POA: Diagnosis not present

## 2022-07-15 DIAGNOSIS — E782 Mixed hyperlipidemia: Secondary | ICD-10-CM | POA: Diagnosis not present

## 2022-07-15 DIAGNOSIS — R7303 Prediabetes: Secondary | ICD-10-CM | POA: Diagnosis not present

## 2022-07-15 DIAGNOSIS — E559 Vitamin D deficiency, unspecified: Secondary | ICD-10-CM

## 2022-07-15 DIAGNOSIS — J339 Nasal polyp, unspecified: Secondary | ICD-10-CM

## 2022-07-15 DIAGNOSIS — J431 Panlobular emphysema: Secondary | ICD-10-CM

## 2022-07-15 DIAGNOSIS — M8000XD Age-related osteoporosis with current pathological fracture, unspecified site, subsequent encounter for fracture with routine healing: Secondary | ICD-10-CM | POA: Diagnosis not present

## 2022-07-15 MED ORDER — ALENDRONATE SODIUM 70 MG PO TABS: 70.0000 mg | ORAL_TABLET | ORAL | 11 refills | Status: DC

## 2022-07-15 NOTE — Assessment & Plan Note (Signed)
Reports sore feeling in the left nostril, likely has a nasal polyp Nasal saline spray as needed to avoid dry nostrils Referred to ENT

## 2022-07-15 NOTE — Assessment & Plan Note (Signed)
Well-controlled with Abigail Gill Uses as needed albuterol Has portable O2 as needed

## 2022-07-15 NOTE — Assessment & Plan Note (Signed)
On statin.

## 2022-07-15 NOTE — Assessment & Plan Note (Signed)
History of compression fracture of thoracic and lumbar vertebra s/p kyphoplasty Did not tolerate bisphosphonate in the past, but prefers to try again Started alendronate Last DEXA scan reviewed -would prefer to start Prolia or Forteo - referred to Endocrinology, but she did not f/u Continue calcium and vitamin D supplements

## 2022-07-15 NOTE — Patient Instructions (Signed)
Please continue to take medications as prescribed.  Okay to use nasal saline spray as needed for dry nostrils.  Please continue to follow low salt diet and perform moderate exercise/walking at least 150 mins/week.

## 2022-07-15 NOTE — Progress Notes (Signed)
Established Patient Office Visit  Subjective:  Patient ID: Abigail Gill, female    DOB: Nov 25, 1960  Age: 61 y.o. MRN: [id number]  CC:  Chief Complaint  Patient presents with   Annual Exam    Annual exam sore on inside of nose for 3 weeks     HPI Abigail Gill is a 61 y.o. female with past medical history of atherosclerosis of aorta, HLD, COPD, osteoporosis with history of thoracic and lumbar compression fractures and environmental allergies who presents for annual physical.  She has history of osteoporosis and compression fractures of thoracic and lumbar vertebra.  She did not tolerate alendronate in the past due to nausea.  Her last DEXA scan showed persistent osteoporosis, for which she is currently only taking calcium and vitamin D supplement.  She was not able to afford Prolia.  She prefers to try alendronate again.  COPD: She has seen improvement with Judithann Sauger now.  She uses albuterol as needed for dyspnea or wheezing.  She also uses O2 as needed for dyspnea.  She reports sore feeling in left nostril and has noticed a bump.  Denies any recent bleeding, but has been itching or picking over it, which has caused more irritation.    Past Medical History:  Diagnosis Date   Aortic atherosclerosis ([place])    Asthma    Back pain    Complex regional pain syndrome type 1 of left upper extremity 03/14/2020   COPD (chronic obstructive pulmonary disease) (HCC)    Former cigarette smoker 07/07/2017   GERD (gastroesophageal reflux disease)    Hyperlipidemia 02/15/2021   Osteoporosis    Shortness of breath    with exertion   Vitamin D deficiency     Past Surgical History:  Procedure Laterality Date   BREAST BIOPSY Left    benign   BREAST ENHANCEMENT SURGERY  1996   bilateral-[place]   BREAST EXCISIONAL BIOPSY Left    benign   BREAST LUMPECTOMY  08/30/2011   Procedure: LUMPECTOMY;  Surgeon: Donato Heinz;  Location: [facility];  Service: General;  Laterality: Left;    KYPHOPLASTY  2018   TUBAL LIGATION  1985   [place]    Family History  Problem Relation Age of Onset   Alcohol abuse Mother    Arthritis Mother    COPD Mother    Hyperlipidemia Mother    Pancreatitis Mother    Heart failure Father    Alcohol abuse Father    Arthritis Father    Diabetes Father    Heart disease Father        several heart attacks   Hyperlipidemia Father    Hypertension Father    Kidney disease Father    Stroke Father        several strokes   COPD Sister    Hypertension Sister    Anesthesia problems Neg Hx    Hypotension Neg Hx    Malignant hyperthermia Neg Hx    Pseudochol deficiency Neg Hx     Social History   Socioeconomic History   Marital status: Married    Spouse name: Juanda Crumble   Number of children: 3   Years of education: 11   Highest education level: Not on file  Occupational History   Occupation: disabled    Comment: COPD  Tobacco Use   Smoking status: Former    Packs/day: 1.00    Years: 30.00    Total pack years: 30.00    Types: Cigarettes    Start  date: 09/16/1976    Quit date: 04/18/2009    Years since quitting: 13.2    Passive exposure: Never   Smokeless tobacco: Never  Vaping Use   Vaping Use: Never used  Substance and Sexual Activity   Alcohol use: No    Alcohol/week: 0.0 standard drinks of alcohol   Drug use: No   Sexual activity: Yes  Other Topics Concern   Not on file  Social History Narrative   Disabled due to COPD   Was a pharmacy tech   Lives at home with  [place] .Married for 19 years.   1 chihuahua.   Social Determinants of Health   Financial Resource Strain: Medium Risk (06/13/2022)   Overall Financial Resource Strain (CARDIA)    Difficulty of Paying Living Expenses: Somewhat hard  Food Insecurity: No Food Insecurity (06/13/2022)   Hunger Vital Sign    Worried About Running Out of Food in the Last Year: Never true    Ran Out of Food in the Last Year: Never true  Transportation Needs: No  Transportation Needs (06/13/2022)   PRAPARE - Hydrologist (Medical): No    Lack of Transportation (Non-Medical): No  Physical Activity: Inactive (06/13/2022)   Exercise Vital Sign    Days of Exercise per Week: 0 days    Minutes of Exercise per Session: 0 min  Stress: No Stress Concern Present (06/13/2022)   [place]    Feeling of Stress : Not at all  Social Connections: Socially Isolated (06/13/2022)   Social Connection and Isolation Panel [NHANES]    Frequency of Communication with Friends and Family: More than three times a week    Frequency of Social Gatherings with Friends and Family: Once a week    Attends Religious Services: Never    Marine scientist or Organizations: No    Attends Archivist Meetings: Never    Marital Status: Separated  Intimate Partner Violence: Not At Risk (06/13/2022)   Humiliation, Afraid, Rape, and Kick questionnaire    Fear of Current or Ex-Partner: No    Emotionally Abused: No    Physically Abused: No    Sexually Abused: No    Outpatient Medications Prior to Visit  Medication Sig Dispense Refill   albuterol (VENTOLIN HFA) 108 (90 Base) MCG/ACT inhaler Inhale 2 puffs into the lungs every 4 (four) hours as needed. For shortness of breath 18 g 2   aspirin 81 MG chewable tablet Chew 1 tablet by mouth daily.     atorvastatin (LIPITOR) 10 MG tablet Take 1 tablet (10 mg total) by mouth at bedtime. 90 tablet 3   Budeson-Glycopyrrol-Formoterol (BREZTRI AEROSPHERE) 160-9-4.8 MCG/ACT AERO Inhale 2 puffs into the lungs 2 (two) times daily. 10.7 g 11   cetirizine (ZYRTEC) 10 MG tablet Take 10 mg by mouth as needed.     cholecalciferol (VITAMIN D3) 25 MCG (1000 UT) tablet Take 10,000 Units by mouth daily.      ondansetron (ZOFRAN) 4 MG tablet Take 1 tablet (4 mg total) by mouth every 8 (eight) hours as needed for nausea or vomiting. 20 tablet 0   PROLIA 60  MG/ML SOSY injection Inject into the skin. (Patient not taking: Reported on 07/15/2022)     No facility-administered medications prior to visit.    Allergies  Allergen Reactions   Pregabalin Other (See Comments)    Headaches    ROS Review of Systems  Constitutional:  Negative for  chills and fever.  HENT:  Negative for congestion, sinus pressure and sore throat.   Eyes:  Negative for pain and discharge.  Respiratory:  Negative for cough and shortness of breath.   Cardiovascular:  Negative for chest pain and palpitations.  Gastrointestinal:  Negative for abdominal pain, diarrhea, nausea and vomiting.  Endocrine: Negative for polydipsia and polyuria.  Genitourinary:  Negative for dysuria and hematuria.  Musculoskeletal:  Positive for arthralgias, back pain and gait problem. Negative for neck pain and neck stiffness.  Skin:  Negative for rash.  Neurological:  Negative for dizziness and weakness.  Psychiatric/Behavioral:  Negative for agitation and behavioral problems.       Objective:    Physical Exam Vitals reviewed.  Constitutional:      General: She is not in acute distress.    Appearance: She is not diaphoretic.  HENT:     Head: Normocephalic and atraumatic.     Nose: No congestion.     Comments: Mild nasal mucosal edema and likely nasal polyp in left nostril    Mouth/Throat:     Mouth: Mucous membranes are moist.  Eyes:     General: No scleral icterus.    Extraocular Movements: Extraocular movements intact.  Neck:     Vascular: No carotid bruit.  Cardiovascular:     Rate and Rhythm: Normal rate and regular rhythm.     Pulses: Normal pulses.     Heart sounds: Normal heart sounds. No murmur heard. Pulmonary:     Breath sounds: Normal breath sounds. No wheezing or rales.  Abdominal:     Palpations: Abdomen is soft.     Tenderness: There is no abdominal tenderness.  Musculoskeletal:     Cervical back: Neck supple. No tenderness.     Right lower leg: No edema.      Left lower leg: No edema.  Skin:    General: Skin is warm.     Findings: No rash.  Neurological:     General: No focal deficit present.     Mental Status: She is alert and oriented to person, place, and time.     Cranial Nerves: No cranial nerve deficit.     Sensory: No sensory deficit.     Motor: No weakness.  Psychiatric:        Mood and Affect: Mood normal.        Behavior: Behavior normal.     BP 122/82 (BP Location: Right Arm, Patient Position: Sitting, Cuff Size: Normal)   Pulse 63   Resp 18   Ht _0  (1.575 m)   Wt 162 lb 12.8 oz (73.8 kg)   SpO2 92%   BMI 29.78 kg/m  Wt Readings from Last 3 Encounters:  07/15/22 162 lb 12.8 oz (73.8 kg)  07/05/22 164 lb (74.4 kg)  02/12/22 164 lb 6.4 oz (74.6 kg)    Lab Results  Component Value Date   TSH 1.63 10/25/2020   Lab Results  Component Value Date   WBC 6.0 06/08/2021   HGB 13.2 06/08/2021   HCT 38.8 06/08/2021   MCV 87 06/08/2021   PLT 244 06/08/2021   Lab Results  Component Value Date   NA 142 06/08/2021   K 4.2 06/08/2021   CO2 24 06/08/2021   GLUCOSE 89 06/08/2021   BUN 10 06/08/2021   CREATININE 0.77 06/08/2021   BILITOT 0.4 06/08/2021   ALKPHOS 114 06/08/2021   AST 17 06/08/2021   ALT 16 06/08/2021   PROT 6.9 06/08/2021  ALBUMIN 4.7 06/08/2021   CALCIUM 9.7 06/08/2021   ANIONGAP 11 12/26/2017   EGFR 87 06/08/2021   Lab Results  Component Value Date   CHOL 179 06/08/2021   Lab Results  Component Value Date   HDL 71 06/08/2021   Lab Results  Component Value Date   LDLCALC 90 06/08/2021   Lab Results  Component Value Date   TRIG 100 06/08/2021   Lab Results  Component Value Date   CHOLHDL 2.5 06/08/2021   Lab Results  Component Value Date   HGBA1C 5.8 (H) 10/25/2020      Assessment & Plan:   Problem List Items Addressed This Visit       Respiratory   Panlobular emphysema ([place])    Well-controlled with Breztri Uses as needed albuterol Has portable O2 as needed       Relevant Orders   TSH   CBC with Differential/Platelet   Nasal polyp    Reports sore feeling in the left nostril, likely has a nasal polyp Nasal saline spray as needed to avoid dry nostrils Referred to ENT      Relevant Orders   Ambulatory referral to ENT     Musculoskeletal and Integument   Osteoporosis with pathological fracture with routine healing    History of compression fracture of thoracic and lumbar vertebra s/p kyphoplasty Did not tolerate bisphosphonate in the past, but prefers to try again Started alendronate Last DEXA scan reviewed -would prefer to start Prolia or Forteo - referred to Endocrinology, but she did not f/u Continue calcium and vitamin D supplements      Relevant Medications   alendronate (FOSAMAX) 70 MG tablet   Other Relevant Orders   TSH   CBC with Differential/Platelet     Other   Vitamin D deficiency   Relevant Orders   VITAMIN D 25 Hydroxy (Vit-D Deficiency, Fractures)   Hyperlipidemia    On statin      Relevant Orders   Lipid panel   Encounter for general adult medical examination with abnormal findings - Primary    Physical exam as documented. Fasting blood tests today. Flu vaccine today. Advised to get Shingrix and Tdap vaccine at local pharmacy.      Relevant Orders   TSH   Other Visit Diagnoses     Prediabetes       Relevant Orders   Hemoglobin A1c   CMP14+EGFR   Need for immunization against influenza       Relevant Orders   Flu Vaccine QUAD 77moIM (Fluarix, Fluzone & Alfiuria Quad PF) (Completed)       Meds ordered this encounter  Medications   alendronate (FOSAMAX) 70 MG tablet    Sig: Take 1 tablet (70 mg total) by mouth every 7 (seven) days. Take with a full glass of water on an empty stomach.    Dispense:  4 tablet    Refill:  11    Follow-up: Return in about 6 months (around 01/14/2023) for Osteoporosis and HLD.    RLindell Spar MD

## 2022-07-15 NOTE — Assessment & Plan Note (Signed)
Physical exam as documented. Fasting blood tests today. Flu vaccine today. Advised to get Shingrix and Tdap vaccine at local pharmacy.

## 2022-07-16 LAB — CMP14+EGFR
ALT: 20 IU/L (ref 0–32)
AST: 16 IU/L (ref 0–40)
Albumin/Globulin Ratio: 2.2 (ref 1.2–2.2)
Albumin: 4.6 g/dL (ref 3.9–4.9)
Alkaline Phosphatase: 122 IU/L — ABNORMAL HIGH (ref 44–121)
BUN/Creatinine Ratio: 16 (ref 12–28)
BUN: 12 mg/dL (ref 8–27)
Bilirubin Total: 0.6 mg/dL (ref 0.0–1.2)
CO2: 25 mmol/L (ref 20–29)
Calcium: 10 mg/dL (ref 8.7–10.3)
Chloride: 102 mmol/L (ref 96–106)
Creatinine, Ser: 0.76 mg/dL (ref 0.57–1.00)
Globulin, Total: 2.1 g/dL (ref 1.5–4.5)
Glucose: 95 mg/dL (ref 70–99)
Potassium: 5.4 mmol/L — ABNORMAL HIGH (ref 3.5–5.2)
Sodium: 142 mmol/L (ref 134–144)
Total Protein: 6.7 g/dL (ref 6.0–8.5)
eGFR: 88 mL/min/{1.73_m2} (ref >59)

## 2022-07-16 LAB — LIPID PANEL
Chol/HDL Ratio: 2.4 ratio (ref 0.0–4.4)
Cholesterol, Total: 171 mg/dL (ref 100–199)
HDL: 72 mg/dL (ref >39)
LDL Chol Calc (NIH): 83 mg/dL (ref 0–99)
Triglycerides: 88 mg/dL (ref 0–149)
VLDL Cholesterol Cal: 16 mg/dL (ref 5–40)

## 2022-07-16 LAB — HEMOGLOBIN A1C
Est. average glucose Bld gHb Est-mCnc: 120 mg/dL
Hgb A1c MFr Bld: 5.8 % — ABNORMAL HIGH (ref 4.8–5.6)

## 2022-07-16 LAB — VITAMIN D 25 HYDROXY (VIT D DEFICIENCY, FRACTURES): Vit D, 25-Hydroxy: 53.4 ng/mL (ref 30.0–100.0)

## 2022-07-16 LAB — CBC WITH DIFFERENTIAL/PLATELET
Basophils Absolute: 0 10*3/uL (ref 0.0–0.2)
Basos: 1 %
EOS (ABSOLUTE): 0.1 10*3/uL (ref 0.0–0.4)
Eos: 2 %
Hematocrit: 42.1 % (ref 34.0–46.6)
Hemoglobin: 13.8 g/dL (ref 11.1–15.9)
Immature Grans (Abs): 0 10*3/uL (ref 0.0–0.1)
Immature Granulocytes: 0 %
Lymphocytes Absolute: 1.4 10*3/uL (ref 0.7–3.1)
Lymphs: 22 %
MCH: 29.2 pg (ref 26.6–33.0)
MCHC: 32.8 g/dL (ref 31.5–35.7)
MCV: 89 fL (ref 79–97)
Monocytes Absolute: 0.4 10*3/uL (ref 0.1–0.9)
Monocytes: 6 %
Neutrophils Absolute: 4.4 10*3/uL (ref 1.4–7.0)
Neutrophils: 69 %
Platelets: 263 10*3/uL (ref 150–450)
RBC: 4.72 x10E6/uL (ref 3.77–5.28)
RDW: 12.6 % (ref 11.7–15.4)
WBC: 6.4 10*3/uL (ref 3.4–10.8)

## 2022-07-16 LAB — TSH: TSH: 1.76 u[IU]/mL (ref 0.450–4.500)

## 2022-07-19 DIAGNOSIS — J449 Chronic obstructive pulmonary disease, unspecified: Secondary | ICD-10-CM | POA: Diagnosis not present

## 2022-07-22 ENCOUNTER — Ambulatory Visit (HOSPITAL_COMMUNITY)
Admission: RE | Admit: 2022-07-22 | Discharge: 2022-07-22 | Disposition: A | Discharge status: Home / Self Care | Payer: Medicare Other | Source: Ambulatory Visit | Attending: Nurse Practitioner | Admitting: Nurse Practitioner

## 2022-07-22 DIAGNOSIS — I700 Atherosclerosis of aorta: Secondary | ICD-10-CM | POA: Insufficient documentation

## 2022-07-22 DIAGNOSIS — Z87891 Personal history of nicotine dependence: Secondary | ICD-10-CM | POA: Insufficient documentation

## 2022-07-22 DIAGNOSIS — Z122 Encounter for screening for malignant neoplasm of respiratory organs: Secondary | ICD-10-CM | POA: Diagnosis not present

## 2022-07-22 DIAGNOSIS — J439 Emphysema, unspecified: Secondary | ICD-10-CM | POA: Diagnosis not present

## 2022-07-22 DIAGNOSIS — I2510 Atherosclerotic heart disease of native coronary artery without angina pectoris: Secondary | ICD-10-CM | POA: Insufficient documentation

## 2022-07-23 NOTE — Progress Notes (Signed)
LDCT negative for lung cancer. Continue yearly screening.

## 2022-07-31 DIAGNOSIS — J449 Chronic obstructive pulmonary disease, unspecified: Secondary | ICD-10-CM | POA: Diagnosis not present

## 2022-08-07 IMAGING — CT CT CHEST W/O CM
2 of 4 series · 15 of 36 positions shown, 18 images · non-contrast
Comparison: 07/31/2019

CLINICAL DATA: Follow-up lung nodule

EXAM:
CT CHEST WITHOUT CONTRAST
TECHNIQUE: Multidetector CT imaging of the chest was performed following the
standard protocol without IV contrast.

[Series 2: routine chest without · axial · non-contrast · 0.59mm/px · z∈[+1261,+1497]mm · 12 of 140 slices shown, 15 images]
[im 11/140  mediastinal]
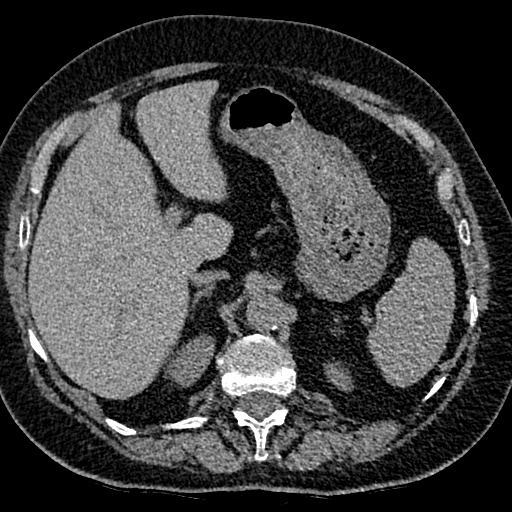
[im 11/140  lung]
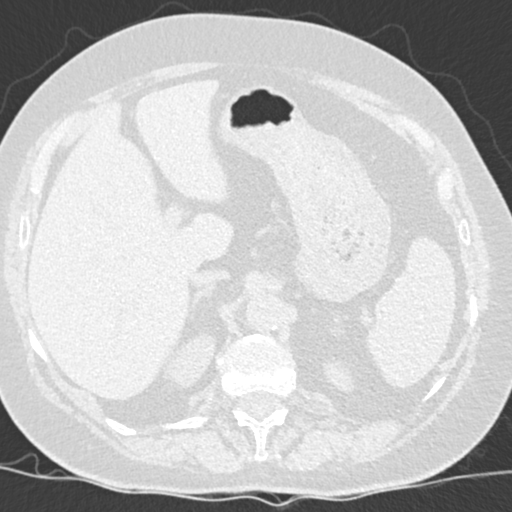
[im 22/140  lung]
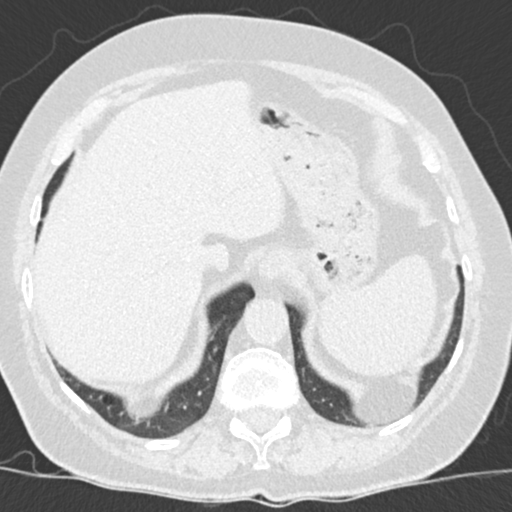
[im 33/140  lung]
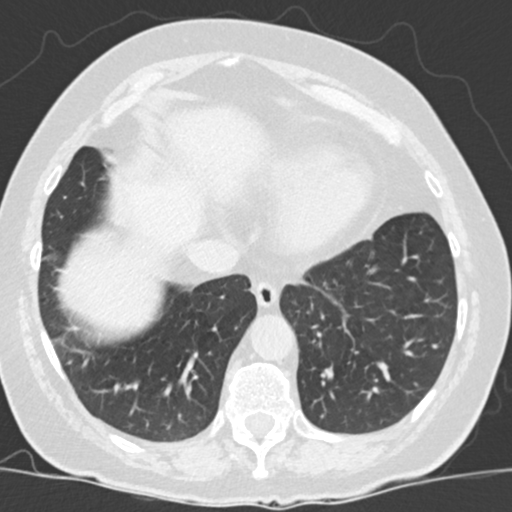
[im 43/140  lung]
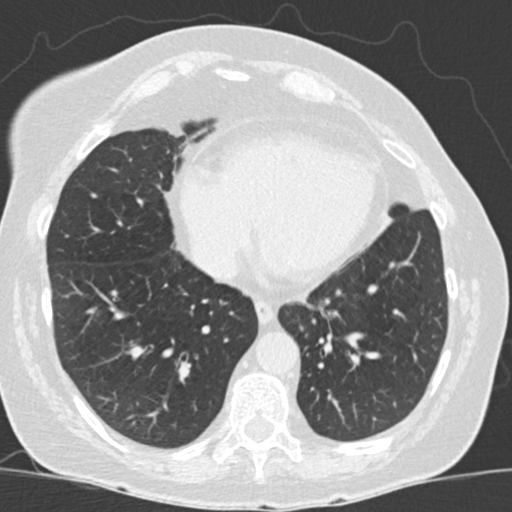
[im 54/140  mediastinal]
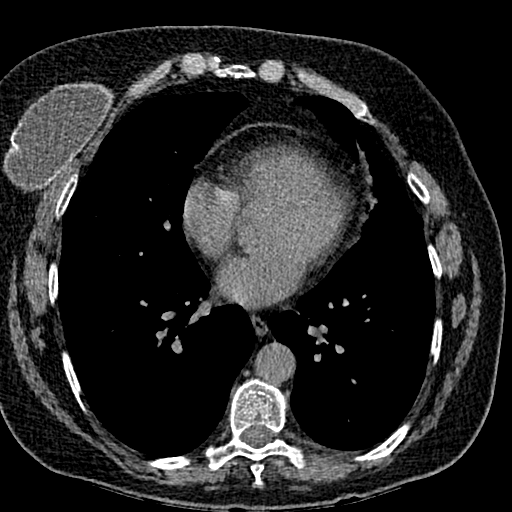
[im 54/140  lung]
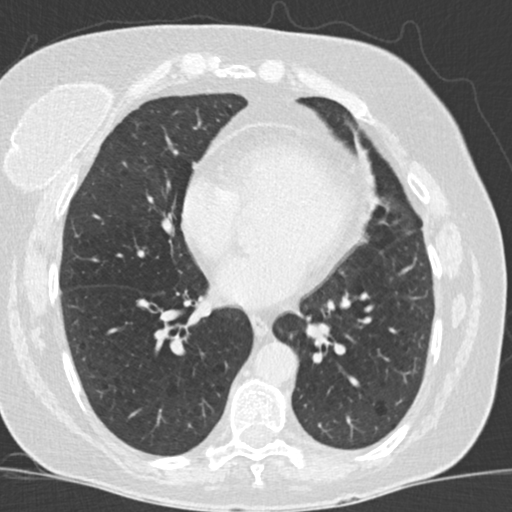
[im 65/140  lung]
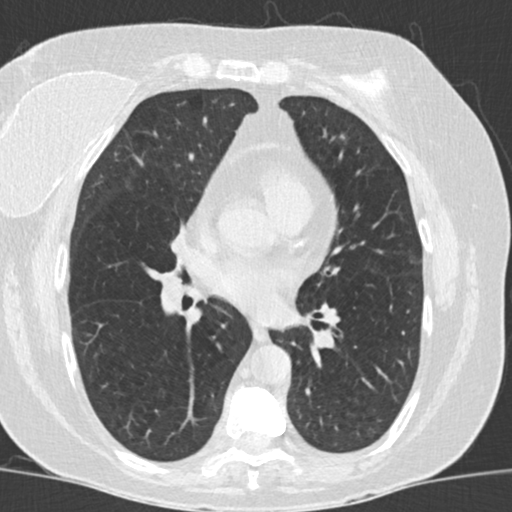
[im 75/140  lung]
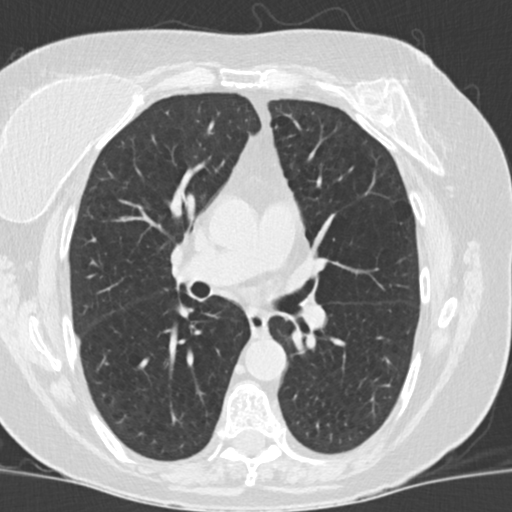
[im 86/140  lung]
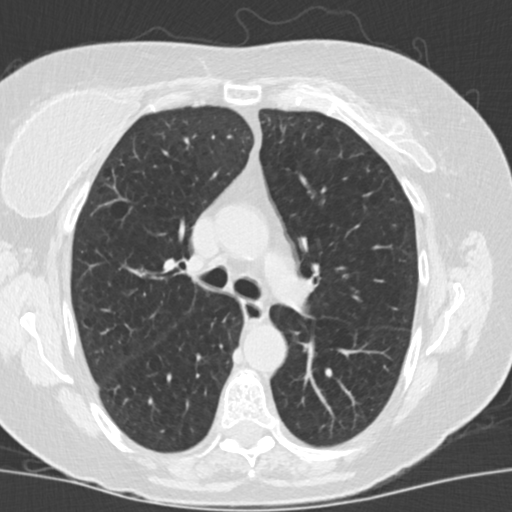
[im 97/140  mediastinal]
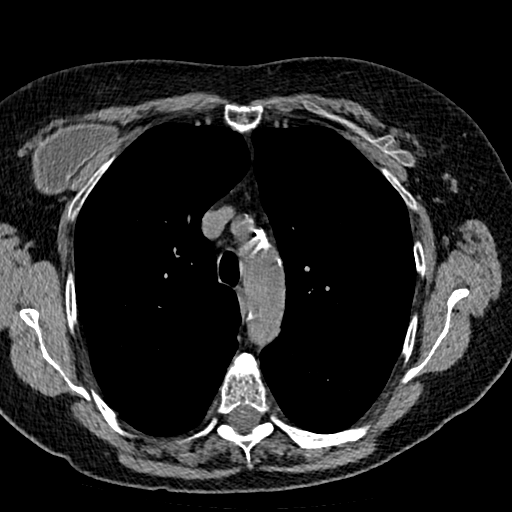
[im 97/140  lung]
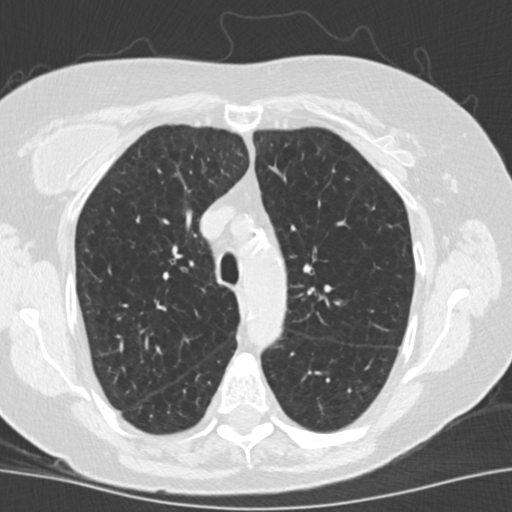
[im 107/140  lung]
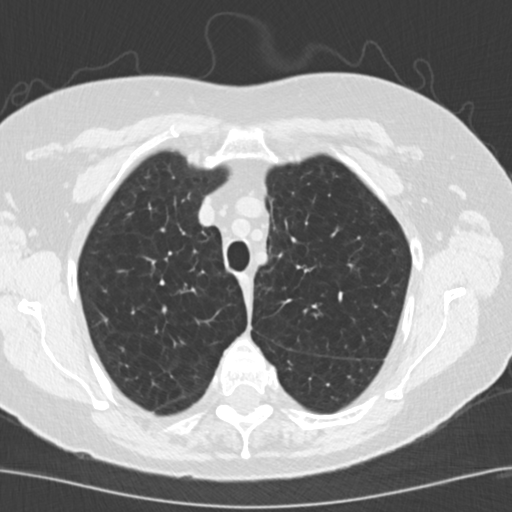
[im 118/140  lung]
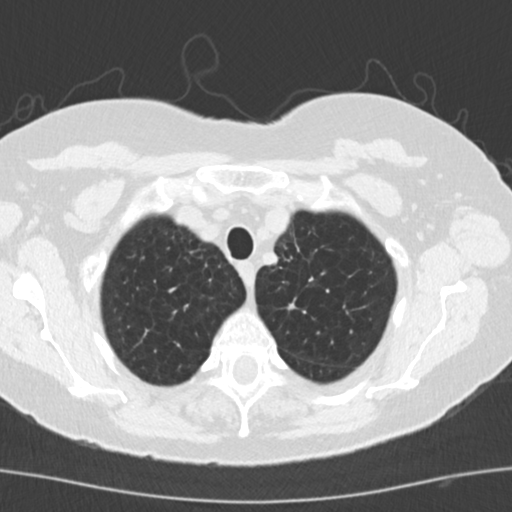
[im 129/140  lung]
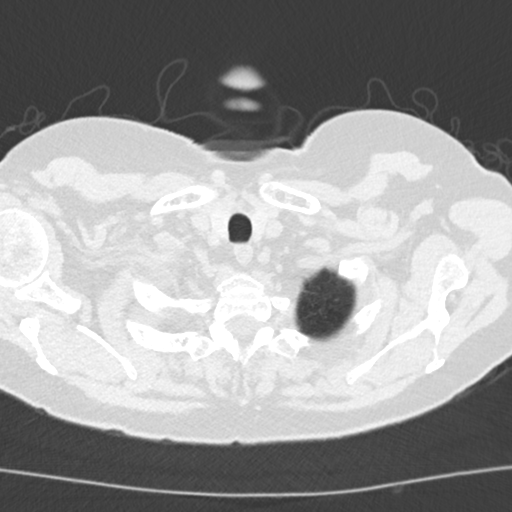

[Series 5: coronal · coronal · 0.54mm/px · 3 of 151 slices shown]
[im 31/151  lung]
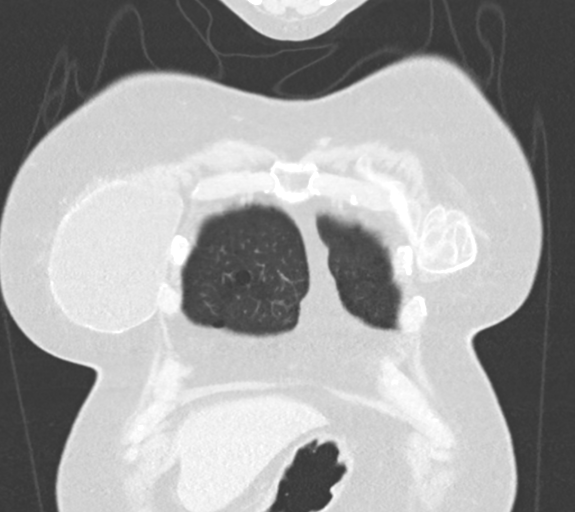
[im 61/151  lung]
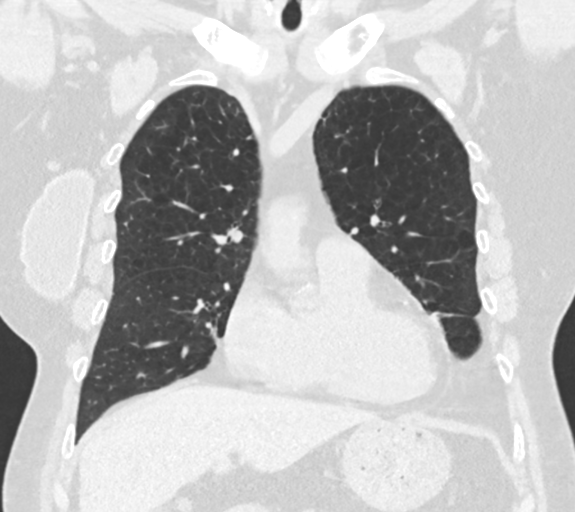
[im 91/151  lung]
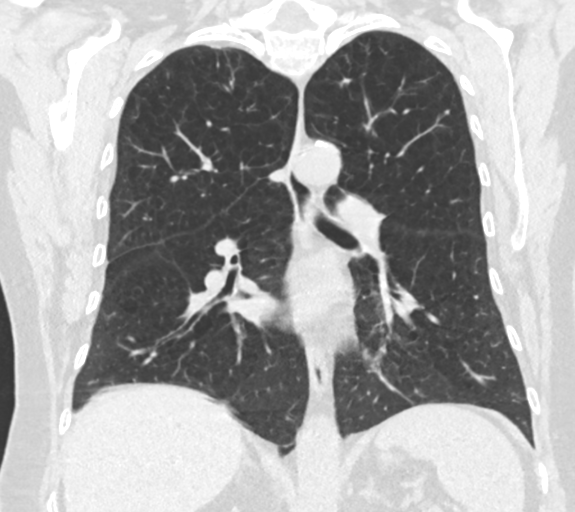

[15 of 36 positions shown; findings below may reference images not displayed]

FINDINGS: Cardiovascular: Aortic and coronary artery calcifications. Heart is
normal size. Aorta is normal caliber.

Mediastinum/Nodes: No mediastinal, hilar, or axillary adenopathy.
Trachea and esophagus are unremarkable. Thyroid unremarkable.

Lungs/Pleura: Moderate emphysema. Previously seen nodular density in
the superior segment of the left lower lobe has resolved. No
suspicious pulmonary nodules. Areas of scarring bilaterally. No
effusions.

Upper Abdomen: Imaging into the upper abdomen demonstrates no acute
findings.

Musculoskeletal: Bilateral breast implants. Left breast implant is
collapsed, unchanged since prior study. No acute bony abnormality.
IMPRESSION: Previously seen nodule in the has resolved. No suspicious superior
segment of the left lower lobe pulmonary nodules.

Coronary artery disease.

Aortic Atherosclerosis (BXZJR-V8B.B) and Emphysema ([id number]-ZGY.L).

## 2022-08-18 DIAGNOSIS — J449 Chronic obstructive pulmonary disease, unspecified: Secondary | ICD-10-CM | POA: Diagnosis not present

## 2022-08-22 DIAGNOSIS — H6691 Otitis media, unspecified, right ear: Secondary | ICD-10-CM | POA: Diagnosis not present

## 2022-08-30 DIAGNOSIS — J449 Chronic obstructive pulmonary disease, unspecified: Secondary | ICD-10-CM | POA: Diagnosis not present

## 2022-09-18 DIAGNOSIS — J449 Chronic obstructive pulmonary disease, unspecified: Secondary | ICD-10-CM | POA: Diagnosis not present

## 2022-09-19 DIAGNOSIS — J340 Abscess, furuncle and carbuncle of nose: Secondary | ICD-10-CM | POA: Diagnosis not present

## 2022-09-30 DIAGNOSIS — J449 Chronic obstructive pulmonary disease, unspecified: Secondary | ICD-10-CM | POA: Diagnosis not present

## 2022-10-15 DIAGNOSIS — J342 Deviated nasal septum: Secondary | ICD-10-CM | POA: Diagnosis not present

## 2022-10-15 DIAGNOSIS — J340 Abscess, furuncle and carbuncle of nose: Secondary | ICD-10-CM | POA: Diagnosis not present

## 2022-10-19 DIAGNOSIS — J449 Chronic obstructive pulmonary disease, unspecified: Secondary | ICD-10-CM | POA: Diagnosis not present

## 2022-10-31 DIAGNOSIS — J449 Chronic obstructive pulmonary disease, unspecified: Secondary | ICD-10-CM | POA: Diagnosis not present

## 2022-11-17 DIAGNOSIS — J449 Chronic obstructive pulmonary disease, unspecified: Secondary | ICD-10-CM | POA: Diagnosis not present

## 2022-11-29 DIAGNOSIS — J449 Chronic obstructive pulmonary disease, unspecified: Secondary | ICD-10-CM | POA: Diagnosis not present

## 2022-12-18 DIAGNOSIS — J449 Chronic obstructive pulmonary disease, unspecified: Secondary | ICD-10-CM | POA: Diagnosis not present

## 2022-12-30 DIAGNOSIS — J449 Chronic obstructive pulmonary disease, unspecified: Secondary | ICD-10-CM | POA: Diagnosis not present

## 2023-01-09 ENCOUNTER — Ambulatory Visit (INDEPENDENT_AMBULATORY_CARE_PROVIDER_SITE_OTHER): Payer: Medicare Other | Admitting: Internal Medicine

## 2023-01-09 ENCOUNTER — Encounter: Payer: Self-pay | Admitting: Internal Medicine

## 2023-01-09 VITALS — BP 119/76 | HR 78 | Ht 62.0 in | Wt 157.8 lb

## 2023-01-09 DIAGNOSIS — E875 Hyperkalemia: Secondary | ICD-10-CM | POA: Diagnosis not present

## 2023-01-09 DIAGNOSIS — E782 Mixed hyperlipidemia: Secondary | ICD-10-CM | POA: Diagnosis not present

## 2023-01-09 DIAGNOSIS — M8000XD Age-related osteoporosis with current pathological fracture, unspecified site, subsequent encounter for fracture with routine healing: Secondary | ICD-10-CM

## 2023-01-09 DIAGNOSIS — J431 Panlobular emphysema: Secondary | ICD-10-CM

## 2023-01-09 DIAGNOSIS — J9611 Chronic respiratory failure with hypoxia: Secondary | ICD-10-CM

## 2023-01-09 DIAGNOSIS — E559 Vitamin D deficiency, unspecified: Secondary | ICD-10-CM | POA: Diagnosis not present

## 2023-01-09 DIAGNOSIS — M4856XS Collapsed vertebra, not elsewhere classified, lumbar region, sequela of fracture: Secondary | ICD-10-CM

## 2023-01-09 MED ORDER — ALBUTEROL SULFATE HFA 108 (90 BASE) MCG/ACT IN AERS: 2.0000 | INHALATION_SPRAY | RESPIRATORY_TRACT | 2 refills | Status: DC | PRN

## 2023-01-09 MED ORDER — BREZTRI AEROSPHERE 160-9-4.8 MCG/ACT IN AERO: 2.0000 | INHALATION_SPRAY | Freq: Two times a day (BID) | RESPIRATORY_TRACT | 11 refills | Status: DC

## 2023-01-09 MED ORDER — MELOXICAM 15 MG PO TABS: 15.0000 mg | ORAL_TABLET | Freq: Every day | ORAL | 0 refills | Status: DC

## 2023-01-09 NOTE — Assessment & Plan Note (Addendum)
History of compression fracture of thoracic and lumbar vertebra s/p kyphoplasty Did not tolerate bisphosphonate in the past, but prefers to try again On alendronate Last DEXA scan reviewed -would prefer to start Prolia or Forteo - referred to Endocrinology, but she did not f/u Continue calcium and vitamin D supplements

## 2023-01-09 NOTE — Assessment & Plan Note (Signed)
Last BMP showed hyperkalemia, likely due to high potassium foods Recheck BMP

## 2023-01-09 NOTE — Assessment & Plan Note (Signed)
Well-controlled with Breztri Uses as needed albuterol Has portable O2 as needed 

## 2023-01-09 NOTE — Progress Notes (Addendum)
Established Patient Office Visit  Subjective:  Patient ID: Abigail Gill, female    DOB: 1960/09/27  Age: 62 y.o. MRN: [id number]  CC:  Chief Complaint  Patient presents with   Hyperlipidemia    Six month follow up    HPI Abigail Gill is a 62 y.o. female with past medical history of atherosclerosis of aorta, HLD, COPD, osteoporosis with history of thoracic and lumbar compression fractures and environmental allergies who presents for f/u of her chronic medical conditions.  She has history of osteoporosis and compression fractures of thoracic and lumbar vertebra.  She is taking alendronate now.  Her last DEXA scan showed persistent osteoporosis, for which she is currently taking calcium and vitamin D supplement.  She was not able to afford Prolia.  COPD: She has noticed improvement in her cough and dyspnea with Breztri.  She also uses albuterol inhaler as needed for dyspnea or wheezing.  She also uses O2 as needed for dyspnea, at bedtime and upon exertion.  O2 sat on room air: 90% O2 sat with ambulation on room air: 87% O2 sat with ambulation on 2 LPM O2: 92%  She reports chronic low back pain, from old compression fracture of lumbar spine.  She has tried taking Tylenol, but has resistant pain at times.  Denies any numbness or tingling of the LE.  Denies saddle anesthesia, urinary or stool incontinence.    Past Medical History:  Diagnosis Date   Aortic atherosclerosis (HCC)    Asthma    Back pain    Closed fracture of left distal radius and ulna 12/03/2019   Last Assessment & Plan:   Formatting of this note might be different from the original.  Treatment:  1.  I agreed to let patient stay home for 2 weeks and do her home exercises only however I wish to see the patient in 2 weeks to verify she is continue to improve her finger range of motion and wrist range of motion.  If she is not showing significant improvement at that time we will send her back    Complex regional pain  syndrome type 1 of left upper extremity 03/14/2020   COPD (chronic obstructive pulmonary disease) (HCC)    Former cigarette smoker 07/07/2017   GERD (gastroesophageal reflux disease)    Hyperlipidemia 02/15/2021   Osteoporosis    Shortness of breath    with exertion   Vitamin D deficiency     Past Surgical History:  Procedure Laterality Date   BREAST BIOPSY Left    benign   BREAST ENHANCEMENT SURGERY  1996   bilateral-[place]   BREAST EXCISIONAL BIOPSY Left    benign   BREAST LUMPECTOMY  08/30/2011   Procedure: LUMPECTOMY;  Surgeon: Fabio Bering;  Location: [facility];  Service: General;  Laterality: Left;   KYPHOPLASTY  2018   TUBAL LIGATION  1985   [place]    Family History  Problem Relation Age of Onset   Alcohol abuse Mother    Arthritis Mother    COPD Mother    Hyperlipidemia Mother    Pancreatitis Mother    Heart failure Father    Alcohol abuse Father    Arthritis Father    Diabetes Father    Heart disease Father        several heart attacks   Hyperlipidemia Father    Hypertension Father    Kidney disease Father    Stroke Father        several strokes  COPD Sister    Hypertension Sister    Anesthesia problems Neg Hx    Hypotension Neg Hx    Malignant hyperthermia Neg Hx    Pseudochol deficiency Neg Hx     Social History   Socioeconomic History   Marital status: Married    Spouse name: [name]   Number of children: 3   Years of education: 11   Highest education level: Not on file  Occupational History   Occupation: disabled    Comment: COPD  Tobacco Use   Smoking status: Former    Packs/day: 1.00    Years: 30.00    Additional pack years: 0.00    Total pack years: 30.00    Types: Cigarettes    Start date: 09/16/1976    Quit date: 04/18/2009    Years since quitting: 13.7    Passive exposure: Never   Smokeless tobacco: Never  Vaping Use   Vaping Use: Never used  Substance and Sexual Activity   Alcohol use: No    Alcohol/week: 0.0  standard drinks of alcohol   Drug use: No   Sexual activity: Yes  Other Topics Concern   Not on file  Social History Narrative   Disabled due to COPD   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   1 chihuahua.   Social Determinants of Health   Financial Resource Strain: Medium Risk (06/13/2022)   Overall Financial Resource Strain (CARDIA)    Difficulty of Paying Living Expenses: Somewhat hard  Food Insecurity: No Food Insecurity (06/13/2022)   Hunger Vital Sign    Worried About Running Out of Food in the Last Year: Never true    Ran Out of Food in the Last Year: Never true  Transportation Needs: No Transportation Needs (06/13/2022)   PRAPARE - Administrator, Civil Service (Medical): No    Lack of Transportation (Non-Medical): No  Physical Activity: Inactive (06/13/2022)   Exercise Vital Sign    Days of Exercise per Week: 0 days    Minutes of Exercise per Session: 0 min  Stress: No Stress Concern Present (06/13/2022)   [employer] of Occupational Health - Occupational Stress Questionnaire    Feeling of Stress : Not at all  Social Connections: Socially Isolated (06/13/2022)   Social Connection and Isolation Panel [NHANES]    Frequency of Communication with Friends and Family: More than three times a week    Frequency of Social Gatherings with Friends and Family: Once a week    Attends Religious Services: Never    Database administrator or Organizations: No    Attends Banker Meetings: Never    Marital Status: Separated  Intimate Partner Violence: Not At Risk (06/13/2022)   Humiliation, Afraid, Rape, and Kick questionnaire    Fear of Current or Ex-Partner: No    Emotionally Abused: No    Physically Abused: No    Sexually Abused: No    Outpatient Medications Prior to Visit  Medication Sig Dispense Refill   alendronate (FOSAMAX) 70 MG tablet Take 1 tablet (70 mg total) by mouth every 7 (seven) days. Take with a  full glass of water on an empty stomach. 4 tablet 11   aspirin 81 MG chewable tablet Chew 1 tablet by mouth daily.     atorvastatin (LIPITOR) 10 MG tablet Take 1 tablet (10 mg total) by mouth at bedtime. 90 tablet 3   cetirizine (ZYRTEC) 10 MG tablet Take  10 mg by mouth as needed.     cholecalciferol (VITAMIN D3) 25 MCG (1000 UT) tablet Take 10,000 Units by mouth daily.      ondansetron (ZOFRAN) 4 MG tablet Take 1 tablet (4 mg total) by mouth every 8 (eight) hours as needed for nausea or vomiting. 20 tablet 0   albuterol (VENTOLIN HFA) 108 (90 Base) MCG/ACT inhaler Inhale 2 puffs into the lungs every 4 (four) hours as needed. For shortness of breath 18 g 2   Budeson-Glycopyrrol-Formoterol (BREZTRI AEROSPHERE) 160-9-4.8 MCG/ACT AERO Inhale 2 puffs into the lungs 2 (two) times daily. 10.7 g 11   No facility-administered medications prior to visit.    Allergies  Allergen Reactions   Pregabalin Other (See Comments)    Headaches    ROS Review of Systems  Constitutional:  Negative for chills and fever.  HENT:  Negative for congestion, sinus pressure and sore throat.   Eyes:  Negative for pain and discharge.  Respiratory:  Positive for cough and shortness of breath.   Cardiovascular:  Negative for chest pain and palpitations.  Gastrointestinal:  Negative for abdominal pain, diarrhea, nausea and vomiting.  Endocrine: Negative for polydipsia and polyuria.  Genitourinary:  Negative for dysuria and hematuria.  Musculoskeletal:  Positive for arthralgias, back pain and gait problem. Negative for neck pain and neck stiffness.  Skin:  Negative for rash.  Neurological:  Negative for dizziness and weakness.  Psychiatric/Behavioral:  Negative for agitation and behavioral problems.       Objective:    Physical Exam Vitals reviewed.  Constitutional:      General: She is not in acute distress.    Appearance: She is not diaphoretic.  HENT:     Head: Normocephalic and atraumatic.     Nose: No  congestion.     Mouth/Throat:     Mouth: Mucous membranes are moist.  Eyes:     General: No scleral icterus.    Extraocular Movements: Extraocular movements intact.  Cardiovascular:     Rate and Rhythm: Normal rate and regular rhythm.     Heart sounds: Normal heart sounds. No murmur heard. Pulmonary:     Breath sounds: Normal breath sounds. No wheezing or rales.  Musculoskeletal:     Cervical back: Neck supple. No tenderness.     Lumbar back: Tenderness present. Decreased range of motion.     Right lower leg: No edema.     Left lower leg: No edema.  Skin:    General: Skin is warm.     Findings: No rash.  Neurological:     General: No focal deficit present.     Mental Status: She is alert and oriented to person, place, and time.  Psychiatric:        Mood and Affect: Mood normal.        Behavior: Behavior normal.     BP 119/76 (BP Location: Left Arm, Patient Position: Sitting, Cuff Size: Normal)   Pulse 78   Ht 5\' 2"  (1.575 m)   Wt 157 lb 12.8 oz (71.6 kg)   SpO2 90%   BMI 28.86 kg/m  Wt Readings from Last 3 Encounters:  01/09/23 157 lb 12.8 oz (71.6 kg)  07/15/22 162 lb 12.8 oz (73.8 kg)  07/05/22 164 lb (74.4 kg)    Lab Results  Component Value Date   TSH 1.760 07/15/2022   Lab Results  Component Value Date   WBC 6.4 07/15/2022   HGB 13.8 07/15/2022   HCT 42.1 07/15/2022  MCV 89 07/15/2022   PLT 263 07/15/2022   Lab Results  Component Value Date   NA 142 07/15/2022   K 5.4 (H) 07/15/2022   CO2 25 07/15/2022   GLUCOSE 95 07/15/2022   BUN 12 07/15/2022   CREATININE 0.76 07/15/2022   BILITOT 0.6 07/15/2022   ALKPHOS 122 (H) 07/15/2022   AST 16 07/15/2022   ALT 20 07/15/2022   PROT 6.7 07/15/2022   ALBUMIN 4.6 07/15/2022   CALCIUM 10.0 07/15/2022   ANIONGAP 11 12/26/2017   EGFR 88 07/15/2022   Lab Results  Component Value Date   CHOL 171 07/15/2022   Lab Results  Component Value Date   HDL 72 07/15/2022   Lab Results  Component Value Date    LDLCALC 83 07/15/2022   Lab Results  Component Value Date   TRIG 88 07/15/2022   Lab Results  Component Value Date   CHOLHDL 2.4 07/15/2022   Lab Results  Component Value Date   HGBA1C 5.8 (H) 07/15/2022      Assessment & Plan:   Problem List Items Addressed This Visit    Osteoporosis with pathological fracture with routine healing History of compression fracture of thoracic and lumbar vertebra s/p kyphoplasty Did not tolerate bisphosphonate in the past, but prefers to try again On alendronate Last DEXA scan reviewed -would prefer to start Prolia or Forteo - referred to Endocrinology, but she did not f/u Continue calcium and vitamin D supplements  Panlobular emphysema (HCC) Well-controlled with Breztri Uses as needed albuterol Has portable O2 as needed  Chronic respiratory failure (HCC) Due to COPD, has quit smoking more than 10 years ago Has portable O2 as needed  Compression fracture of lumbar spine, non-traumatic, sequela Chronic low back pain, has history of compression fracture of L2, L3, L4 Tylenol arthritis as needed for pain Mobic as needed for moderate pain Avoid heavy lifting and frequent bending Simple back exercises  Vitamin D deficiency She takes vitamin D 10,000 IU daily Continue with vitamin D 5000 IU daily only  Hyperlipidemia On statin  Hyperkalemia Last BMP showed hyperkalemia, likely due to high potassium foods Recheck BMP    Meds ordered this encounter  Medications   meloxicam (MOBIC) 15 MG tablet    Sig: Take 1 tablet (15 mg total) by mouth daily.    Dispense:  30 tablet    Refill:  0   Budeson-Glycopyrrol-Formoterol (BREZTRI AEROSPHERE) 160-9-4.8 MCG/ACT AERO    Sig: Inhale 2 puffs into the lungs 2 (two) times daily.    Dispense:  10.7 g    Refill:  11   albuterol (VENTOLIN HFA) 108 (90 Base) MCG/ACT inhaler    Sig: Inhale 2 puffs into the lungs every 4 (four) hours as needed. For shortness of breath    Dispense:  18 g     Refill:  2    Follow-up: Return in about 6 months (around 07/11/2023) for Annual physical (after 07/16/23).    Anabel Halon, MD

## 2023-01-09 NOTE — Assessment & Plan Note (Signed)
Due to COPD, has quit smoking more than 10 years ago ?Has portable O2 as needed ?

## 2023-01-09 NOTE — Assessment & Plan Note (Signed)
Chronic low back pain, has history of compression fracture of L2, L3, L4 Tylenol arthritis as needed for pain Mobic as needed for moderate pain Avoid heavy lifting and frequent bending Simple back exercises

## 2023-01-09 NOTE — Assessment & Plan Note (Signed)
On statin.

## 2023-01-09 NOTE — Assessment & Plan Note (Addendum)
She takes vitamin D 10,000 IU daily Continue with vitamin D 5000 IU daily only

## 2023-01-09 NOTE — Patient Instructions (Signed)
Please take Meloxicam as needed for back pain. Okay to alternate with Tylenol arthritis.  Please avoid heavy lifting and frequent bending.  Please continue to take medications as prescribed.  Please continue to follow low salt diet and perform moderate exercise/walking at least 150 mins/week.

## 2023-01-10 LAB — CMP14+EGFR
ALT: 23 IU/L (ref 0–32)
AST: 18 IU/L (ref 0–40)
Albumin/Globulin Ratio: 1.8 (ref 1.2–2.2)
Albumin: 4.3 g/dL (ref 3.9–4.9)
Alkaline Phosphatase: 120 IU/L (ref 44–121)
BUN/Creatinine Ratio: 18 (ref 12–28)
BUN: 14 mg/dL (ref 8–27)
Bilirubin Total: 0.5 mg/dL (ref 0.0–1.2)
CO2: 22 mmol/L (ref 20–29)
Calcium: 9.1 mg/dL (ref 8.7–10.3)
Chloride: 102 mmol/L (ref 96–106)
Creatinine, Ser: 0.79 mg/dL (ref 0.57–1.00)
Globulin, Total: 2.4 g/dL (ref 1.5–4.5)
Glucose: 90 mg/dL (ref 70–99)
Potassium: 4.7 mmol/L (ref 3.5–5.2)
Sodium: 142 mmol/L (ref 134–144)
Total Protein: 6.7 g/dL (ref 6.0–8.5)
eGFR: 83 mL/min/{1.73_m2} (ref >59)

## 2023-01-10 LAB — VITAMIN D 25 HYDROXY (VIT D DEFICIENCY, FRACTURES): Vit D, 25-Hydroxy: 45 ng/mL (ref 30.0–100.0)

## 2023-01-14 ENCOUNTER — Ambulatory Visit: Payer: Medicare Other | Admitting: Internal Medicine

## 2023-01-17 DIAGNOSIS — J449 Chronic obstructive pulmonary disease, unspecified: Secondary | ICD-10-CM | POA: Diagnosis not present

## 2023-01-29 DIAGNOSIS — J449 Chronic obstructive pulmonary disease, unspecified: Secondary | ICD-10-CM | POA: Diagnosis not present

## 2023-02-17 DIAGNOSIS — J449 Chronic obstructive pulmonary disease, unspecified: Secondary | ICD-10-CM | POA: Diagnosis not present

## 2023-03-01 DIAGNOSIS — J449 Chronic obstructive pulmonary disease, unspecified: Secondary | ICD-10-CM | POA: Diagnosis not present

## 2023-03-19 DIAGNOSIS — J449 Chronic obstructive pulmonary disease, unspecified: Secondary | ICD-10-CM | POA: Diagnosis not present

## 2023-03-31 DIAGNOSIS — M79674 Pain in right toe(s): Secondary | ICD-10-CM | POA: Diagnosis not present

## 2023-03-31 DIAGNOSIS — M79671 Pain in right foot: Secondary | ICD-10-CM | POA: Diagnosis not present

## 2023-03-31 DIAGNOSIS — L600 Ingrowing nail: Secondary | ICD-10-CM | POA: Diagnosis not present

## 2023-03-31 DIAGNOSIS — L03031 Cellulitis of right toe: Secondary | ICD-10-CM | POA: Diagnosis not present

## 2023-03-31 DIAGNOSIS — J449 Chronic obstructive pulmonary disease, unspecified: Secondary | ICD-10-CM | POA: Diagnosis not present

## 2023-04-02 DIAGNOSIS — R030 Elevated blood-pressure reading, without diagnosis of hypertension: Secondary | ICD-10-CM | POA: Diagnosis not present

## 2023-04-14 DIAGNOSIS — L03031 Cellulitis of right toe: Secondary | ICD-10-CM | POA: Diagnosis not present

## 2023-04-14 DIAGNOSIS — L600 Ingrowing nail: Secondary | ICD-10-CM | POA: Diagnosis not present

## 2023-04-14 DIAGNOSIS — M79674 Pain in right toe(s): Secondary | ICD-10-CM | POA: Diagnosis not present

## 2023-04-14 DIAGNOSIS — M79671 Pain in right foot: Secondary | ICD-10-CM | POA: Diagnosis not present

## 2023-04-19 DIAGNOSIS — J449 Chronic obstructive pulmonary disease, unspecified: Secondary | ICD-10-CM | POA: Diagnosis not present

## 2023-04-30 DIAGNOSIS — M79674 Pain in right toe(s): Secondary | ICD-10-CM | POA: Diagnosis not present

## 2023-04-30 DIAGNOSIS — L03031 Cellulitis of right toe: Secondary | ICD-10-CM | POA: Diagnosis not present

## 2023-04-30 DIAGNOSIS — M79671 Pain in right foot: Secondary | ICD-10-CM | POA: Diagnosis not present

## 2023-04-30 DIAGNOSIS — L600 Ingrowing nail: Secondary | ICD-10-CM | POA: Diagnosis not present

## 2023-05-01 DIAGNOSIS — J449 Chronic obstructive pulmonary disease, unspecified: Secondary | ICD-10-CM | POA: Diagnosis not present

## 2023-05-14 ENCOUNTER — Other Ambulatory Visit: Payer: Self-pay | Admitting: Internal Medicine

## 2023-05-14 ENCOUNTER — Telehealth: Payer: Self-pay | Admitting: Internal Medicine

## 2023-05-14 DIAGNOSIS — J9611 Chronic respiratory failure with hypoxia: Secondary | ICD-10-CM

## 2023-05-14 DIAGNOSIS — L03031 Cellulitis of right toe: Secondary | ICD-10-CM | POA: Diagnosis not present

## 2023-05-14 DIAGNOSIS — M79671 Pain in right foot: Secondary | ICD-10-CM | POA: Diagnosis not present

## 2023-05-14 DIAGNOSIS — L600 Ingrowing nail: Secondary | ICD-10-CM | POA: Diagnosis not present

## 2023-05-14 DIAGNOSIS — M79674 Pain in right toe(s): Secondary | ICD-10-CM | POA: Diagnosis not present

## 2023-05-14 NOTE — Telephone Encounter (Signed)
Faxed paperwork to [facility].

## 2023-05-14 NOTE — Telephone Encounter (Signed)
Vicky called from [employer] requesting an RX for oxygen along with office notes reason for the oxygen and needs the oxygen testing to be faxed to [phone], phone # [phone]. needs setup ASAP

## 2023-05-20 DIAGNOSIS — J449 Chronic obstructive pulmonary disease, unspecified: Secondary | ICD-10-CM | POA: Diagnosis not present

## 2023-05-21 DIAGNOSIS — M79674 Pain in right toe(s): Secondary | ICD-10-CM | POA: Diagnosis not present

## 2023-05-21 DIAGNOSIS — S90111D Contusion of right great toe without damage to nail, subsequent encounter: Secondary | ICD-10-CM | POA: Diagnosis not present

## 2023-05-21 DIAGNOSIS — M79671 Pain in right foot: Secondary | ICD-10-CM | POA: Diagnosis not present

## 2023-06-01 DIAGNOSIS — J449 Chronic obstructive pulmonary disease, unspecified: Secondary | ICD-10-CM | POA: Diagnosis not present

## 2023-06-19 DIAGNOSIS — J449 Chronic obstructive pulmonary disease, unspecified: Secondary | ICD-10-CM | POA: Diagnosis not present

## 2023-07-14 ENCOUNTER — Ambulatory Visit (INDEPENDENT_AMBULATORY_CARE_PROVIDER_SITE_OTHER): Payer: Medicare Other

## 2023-07-14 VITALS — Ht 62.0 in | Wt 157.0 lb

## 2023-07-14 DIAGNOSIS — Z0000 Encounter for general adult medical examination without abnormal findings: Secondary | ICD-10-CM | POA: Diagnosis not present

## 2023-07-14 NOTE — Patient Instructions (Signed)
Abigail Gill , Thank you for taking time to come for your Medicare Wellness Visit. I appreciate your ongoing commitment to your health goals. Please review the following plan we discussed and let me know if I can assist you in the future.   Referrals/Orders/Follow-Ups/Clinician Recommendations: Aim for 30 minutes of exercise or brisk walking, 6-8 glasses of water, and 5 servings of fruits and vegetables each day.  This is a list of the screening recommended for you and due dates:  Health Maintenance  Topic Date Due   Zoster (Shingles) Vaccine (1 of 2) Never done   COVID-19 Vaccine (2 - 2023-24 season) 05/18/2023   Mammogram  06/16/2023   Screening for Lung Cancer  07/23/2023   Flu Shot  12/15/2023*   Medicare Annual Wellness Visit  07/13/2024   Colon Cancer Screening  05/28/2026   DTaP/Tdap/Td vaccine (2 - Tdap) 01/15/2027   Pap with HPV screening  02/13/2027   Hepatitis C Screening  Completed   HIV Screening  Completed   HPV Vaccine  Aged Out  *Topic was postponed. The date shown is not the original due date.    Advanced directives: (ACP Link)Information on Advanced Care Planning can be found at [facility] [place] Advance Health Care Directives Advance Health Care Directives ([email or web address]   Next Medicare Annual Wellness Visit scheduled for next year: Yes

## 2023-07-14 NOTE — Progress Notes (Signed)
Subjective:   Abigail Gill is a 62 y.o. female who presents for Medicare Annual (Subsequent) preventive examination.  Visit Complete: Virtual I connected with  [name] on 07/14/23 by a audio enabled telemedicine application and verified that I am speaking with the correct person using two identifiers.  Patient Location: Home  Provider Location: Home Office  I discussed the limitations of evaluation and management by telemedicine. The patient expressed understanding and agreed to proceed.  Vital Signs: Because this visit was a virtual/telehealth visit, some criteria may be missing or patient reported. Any vitals not documented were not able to be obtained and vitals that have been documented are patient reported.  Cardiac Risk Factors include: dyslipidemia;sedentary lifestyle;smoking/ tobacco exposure     Objective:    Today's Vitals   07/14/23 0951  Weight: 157 lb (71.2 kg)  Height: 5\' 2"  (1.575 m)   Body mass index is 28.72 kg/m.     07/14/2023    9:59 AM 06/13/2022    8:26 AM 06/12/2021    4:09 PM 02/11/2020   10:05 AM 08/26/2011    8:03 AM  Advanced Directives  Does Patient Have a Medical Advance Directive? No No No No Patient does not have advance directive;Patient would not like information  Would patient like information on creating a medical advance directive? Yes (MAU/Ambulatory/Procedural Areas - Information given) Yes (MAU/Ambulatory/Procedural Areas - Information given)  No - Patient declined   Pre-existing out of facility DNR order (yellow form or pink MOST form)     No    Current Medications (verified) Outpatient Encounter Medications as of 07/14/2023  Medication Sig   albuterol (VENTOLIN HFA) 108 (90 Base) MCG/ACT inhaler Inhale 2 puffs into the lungs every 4 (four) hours as needed. For shortness of breath   alendronate (FOSAMAX) 70 MG tablet Take 1 tablet (70 mg total) by mouth every 7 (seven) days. Take with a full glass of water on an empty  stomach.   aspirin 81 MG chewable tablet Chew 1 tablet by mouth daily.   atorvastatin (LIPITOR) 10 MG tablet Take 1 tablet (10 mg total) by mouth at bedtime.   Budeson-Glycopyrrol-Formoterol (BREZTRI AEROSPHERE) 160-9-4.8 MCG/ACT AERO Inhale 2 puffs into the lungs 2 (two) times daily.   cetirizine (ZYRTEC) 10 MG tablet Take 10 mg by mouth as needed.   cholecalciferol (VITAMIN D3) 25 MCG (1000 UT) tablet Take 10,000 Units by mouth daily.    meloxicam (MOBIC) 15 MG tablet Take 1 tablet (15 mg total) by mouth daily.   ondansetron (ZOFRAN) 4 MG tablet Take 1 tablet (4 mg total) by mouth every 8 (eight) hours as needed for nausea or vomiting.   No facility-administered encounter medications on file as of 07/14/2023.    Allergies (verified) Pregabalin   History: Past Medical History:  Diagnosis Date   Aortic atherosclerosis (HCC)    Asthma    Back pain    Closed fracture of left distal radius and ulna 12/03/2019   Last Assessment & Plan:   Formatting of this note might be different from the original.  Treatment:  1.  I agreed to let patient stay home for 2 weeks and do her home exercises only however I wish to see the patient in 2 weeks to verify she is continue to improve her finger range of motion and wrist range of motion.  If she is not showing significant improvement at that time we will send her back    Complex regional pain syndrome type 1  of left upper extremity 03/14/2020   COPD (chronic obstructive pulmonary disease) (HCC)    Former cigarette smoker 07/07/2017   GERD (gastroesophageal reflux disease)    Hyperlipidemia 02/15/2021   Osteoporosis    Shortness of breath    with exertion   Vitamin D deficiency    Past Surgical History:  Procedure Laterality Date   BREAST BIOPSY Left    benign   BREAST ENHANCEMENT SURGERY  1996   bilateral-[place]   BREAST EXCISIONAL BIOPSY Left    benign   BREAST LUMPECTOMY  08/30/2011   Procedure: LUMPECTOMY;  Surgeon: Fabio Bering;   Location: [facility];  Service: General;  Laterality: Left;   KYPHOPLASTY  2018   TUBAL LIGATION  1985   [place]   Family History  Problem Relation Age of Onset   Alcohol abuse Mother    Arthritis Mother    COPD Mother    Hyperlipidemia Mother    Pancreatitis Mother    Heart failure Father    Alcohol abuse Father    Arthritis Father    Diabetes Father    Heart disease Father        several heart attacks   Hyperlipidemia Father    Hypertension Father    Kidney disease Father    Stroke Father        several strokes   COPD Sister    Hypertension Sister    Anesthesia problems Neg Hx    Hypotension Neg Hx    Malignant hyperthermia Neg Hx    Pseudochol deficiency Neg Hx    Social History   Socioeconomic History   Marital status: Married    Spouse name: [name]   Number of children: 3   Years of education: 11   Highest education level: Not on file  Occupational History   Occupation: disabled    Comment: COPD  Tobacco Use   Smoking status: Former    Current packs/day: 0.00    Average packs/day: 1 pack/day for 32.6 years (32.6 ttl pk-yrs)    Types: Cigarettes    Start date: 09/16/1976    Quit date: 04/18/2009    Years since quitting: 14.2    Passive exposure: Never   Smokeless tobacco: Never  Vaping Use   Vaping status: Never Used  Substance and Sexual Activity   Alcohol use: No    Alcohol/week: 0.0 standard drinks of alcohol   Drug use: No   Sexual activity: Yes  Other Topics Concern   Not on file  Social History Narrative   Disabled due to COPD   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   1 chihuahua.   Social Determinants of Health   Financial Resource Strain: Low Risk  (07/14/2023)   Overall Financial Resource Strain (CARDIA)    Difficulty of Paying Living Expenses: Not very hard  Food Insecurity: No Food Insecurity (07/14/2023)   Hunger Vital Sign    Worried About Running Out of Food in the Last Year: Never true     Ran Out of Food in the Last Year: Never true  Transportation Needs: No Transportation Needs (07/14/2023)   PRAPARE - Administrator, Civil Service (Medical): No    Lack of Transportation (Non-Medical): No  Physical Activity: Inactive (07/14/2023)   Exercise Vital Sign    Days of Exercise per Week: 0 days    Minutes of Exercise per Session: 0 min  Stress: No Stress Concern Present (07/14/2023)  [employer] of Occupational Health - Occupational Stress Questionnaire    Feeling of Stress : Not at all  Social Connections: Socially Isolated (07/14/2023)   Social Connection and Isolation Panel [NHANES]    Frequency of Communication with Friends and Family: More than three times a week    Frequency of Social Gatherings with Friends and Family: Twice a week    Attends Religious Services: Never    Database administrator or Organizations: No    Attends Engineer, structural: Never    Marital Status: Separated    Tobacco Counseling Counseling given: Not Answered   Clinical Intake:  Pre-visit preparation completed: Yes  Pain : No/denies pain     Diabetes: No  How often do you need to have someone help you when you read instructions, pamphlets, or other written materials from your doctor or pharmacy?: 1 - Never  Interpreter Needed?: No  Information entered by :: Kandis Fantasia LPN   Activities of Daily Living    07/14/2023    9:58 AM  In your present state of health, do you have any difficulty performing the following activities:  Hearing? 0  Vision? 0  Difficulty concentrating or making decisions? 0  Walking or climbing stairs? 0  Dressing or bathing? 0  Doing errands, shopping? 0  Preparing Food and eating ? N  Using the Toilet? N  In the past six months, have you accidently leaked urine? N  Do you have problems with loss of bowel control? N  Managing your Medications? N  Managing your Finances? N  Housekeeping or managing your  Housekeeping? N    Patient Care Team: Anabel Halon, MD as PCP - General (Internal Medicine) Wyline Mood Dorothe Pea, MD as PCP - Cardiology (Cardiology)  Indicate any recent Medical Services you may have received from other than [facility] providers in the past year (date may be approximate).     Assessment:   This is a routine wellness examination for [place].  Hearing/Vision screen Hearing Screening - Comments:: Denies hearing difficulties   Vision Screening - Comments:: up to date with routine eye exams with [facility]. Jonita Albee    Goals Addressed             This Visit's Progress    COMPLETED: Exercise 150 minutes per week (moderate activity)        Depression Screen    07/14/2023    9:56 AM 01/09/2023    9:52 AM 07/15/2022    9:29 AM 06/13/2022    8:26 AM 02/12/2022    9:29 AM 01/08/2022    9:32 AM 09/07/2021   10:50 AM  PHQ 2/9 Scores  PHQ - 2 Score 0 0 3 0 0 0 0  PHQ- 9 Score   3        Fall Risk    07/14/2023    9:58 AM 01/09/2023    9:52 AM 07/15/2022    9:29 AM 06/13/2022    8:26 AM 02/12/2022    9:29 AM  Fall Risk   Falls in the past year? 0 0 0 0 0  Number falls in past yr: 0 0 0 0 0  Injury with Fall? 0 0 0 0 0  Risk for fall due to : No Fall Risks  No Fall Risks  No Fall Risks  Follow up Falls prevention discussed;Education provided;Falls evaluation completed  Falls evaluation completed Falls evaluation completed;Education provided;Falls prevention discussed Falls evaluation completed    MEDICARE RISK AT HOME: Medicare Risk at  Home Any stairs in or around the home?: No If so, are there any without handrails?: No Home free of loose throw rugs in walkways, pet beds, electrical cords, etc?: Yes Adequate lighting in your home to reduce risk of falls?: Yes Life alert?: No Use of a cane, walker or w/c?: No Grab bars in the bathroom?: Yes Shower chair or bench in shower?: No Elevated toilet seat or a handicapped toilet?: No  TIMED UP AND GO:  Was the test  performed?  No    Cognitive Function:        07/14/2023    9:59 AM 06/13/2022    8:31 AM 06/12/2021    4:10 PM 02/11/2020   10:09 AM  6CIT Screen  What Year? 0 points 0 points 0 points 0 points  What month? 0 points 0 points 0 points 0 points  What time? 0 points 0 points 0 points 0 points  Count back from 20 0 points 0 points 0 points 0 points  Months in reverse 0 points 0 points 0 points 0 points  Repeat phrase 0 points 0 points 0 points 0 points  Total Score 0 points 0 points 0 points 0 points    Immunizations Immunization History  Administered Date(s) Administered   Influenza, Quadrivalent, Recombinant, Inj, Pf 06/25/2019   Influenza,inj,Quad PF,6+ Mos 06/29/2017, 07/13/2020, 06/08/2021, 07/15/2022   Influenza-Unspecified 06/29/2017, 06/25/2019   Janssen (J&J) SARS-COV-2 Vaccination 01/22/2020   Pneumococcal Conjugate-13 07/07/2017   Pneumococcal Polysaccharide-23 01/13/2020   Td 01/14/2017    TDAP status: Up to date  Flu Vaccine status: Due, Education has been provided regarding the importance of this vaccine. Advised may receive this vaccine at local pharmacy or Health Dept. Aware to provide a copy of the vaccination record if obtained from local pharmacy or Health Dept. Verbalized acceptance and understanding.  Pneumococcal vaccine status: Up to date  Covid-19 vaccine status: Information provided on how to obtain vaccines.   Qualifies for Shingles Vaccine? Yes   Zostavax completed No   Shingrix Completed?: No.    Education has been provided regarding the importance of this vaccine. Patient has been advised to call insurance company to determine out of pocket expense if they have not yet received this vaccine. Advised may also receive vaccine at local pharmacy or Health Dept. Verbalized acceptance and understanding.  Screening Tests Health Maintenance  Topic Date Due   Zoster Vaccines- Shingrix (1 of 2) Never done   COVID-19 Vaccine (2 - 2023-24 season) 05/18/2023    MAMMOGRAM  06/16/2023   Lung Cancer Screening  07/23/2023   INFLUENZA VACCINE  12/15/2023 (Originally 04/17/2023)   Medicare Annual Wellness (AWV)  07/13/2024   Colonoscopy  05/28/2026   DTaP/Tdap/Td (2 - Tdap) 01/15/2027   Cervical Cancer Screening (HPV/Pap Cotest)  02/13/2027   Hepatitis C Screening  Completed   HIV Screening  Completed   HPV VACCINES  Aged Out    Health Maintenance  Health Maintenance Due  Topic Date Due   Zoster Vaccines- Shingrix (1 of 2) Never done   COVID-19 Vaccine (2 - 2023-24 season) 05/18/2023   MAMMOGRAM  06/16/2023   Lung Cancer Screening  07/23/2023    Colorectal cancer screening: Type of screening: Colonoscopy. Completed 05/28/16. Repeat every 10 years  Mammogram status:  Patient declines at this time   Bone Density status: Ordered 01/09/23. Pt provided with contact info and advised to call to schedule appt.  Lung Cancer Screening: (Low Dose CT Chest recommended if Age 43-80 years, 20 pack-year currently  smoking OR have quit w/in 15years.) does qualify.   Lung Cancer Screening Referral: last 07/22/22  Additional Screening:  Hepatitis C Screening: does qualify; Completed 08/16/18  Vision Screening: Recommended annual ophthalmology exams for early detection of glaucoma and other disorders of the eye. Is the patient up to date with their annual eye exam?  Yes  Who is the provider or what is the name of the office in which the patient attends annual eye exams? [facility] If pt is not established with a provider, would they like to be referred to a provider to establish care? No .   Dental Screening: Recommended annual dental exams for proper oral hygiene  Community Resource Referral / Chronic Care Management: CRR required this visit?  No   CCM required this visit?  No     Plan:     I have personally reviewed and noted the following in the patient's chart:   Medical and social history Use of alcohol, tobacco or illicit drugs  Current  medications and supplements including opioid prescriptions. Patient is not currently taking opioid prescriptions. Functional ability and status Nutritional status Physical activity Advanced directives List of other physicians Hospitalizations, surgeries, and ER visits in previous 12 months Vitals Screenings to include cognitive, depression, and falls Referrals and appointments  In addition, I have reviewed and discussed with patient certain preventive protocols, quality metrics, and best practice recommendations. A written personalized care plan for preventive services as well as general preventive health recommendations were provided to patient.     Kandis Fantasia [place], [place]   [id number]/[id number]   After Visit Summary: (Pick Up) Due to this being a telephonic visit, with patients personalized plan was offered to patient and patient has requested to Pick up at office.  Nurse Notes: No concerns at this time

## 2023-07-18 ENCOUNTER — Encounter: Payer: Self-pay | Admitting: Internal Medicine

## 2023-07-18 ENCOUNTER — Other Ambulatory Visit (HOSPITAL_COMMUNITY): Payer: Self-pay | Admitting: Internal Medicine

## 2023-07-18 ENCOUNTER — Ambulatory Visit: Payer: Medicare Other | Admitting: Internal Medicine

## 2023-07-18 VITALS — BP 117/80 | HR 68 | Resp 16 | Ht 62.0 in | Wt 163.0 lb

## 2023-07-18 DIAGNOSIS — I700 Atherosclerosis of aorta: Secondary | ICD-10-CM | POA: Diagnosis not present

## 2023-07-18 DIAGNOSIS — R7303 Prediabetes: Secondary | ICD-10-CM

## 2023-07-18 DIAGNOSIS — E782 Mixed hyperlipidemia: Secondary | ICD-10-CM | POA: Diagnosis not present

## 2023-07-18 DIAGNOSIS — E559 Vitamin D deficiency, unspecified: Secondary | ICD-10-CM

## 2023-07-18 DIAGNOSIS — Z23 Encounter for immunization: Secondary | ICD-10-CM | POA: Diagnosis not present

## 2023-07-18 DIAGNOSIS — Z0001 Encounter for general adult medical examination with abnormal findings: Secondary | ICD-10-CM

## 2023-07-18 DIAGNOSIS — M8000XD Age-related osteoporosis with current pathological fracture, unspecified site, subsequent encounter for fracture with routine healing: Secondary | ICD-10-CM

## 2023-07-18 DIAGNOSIS — J431 Panlobular emphysema: Secondary | ICD-10-CM | POA: Diagnosis not present

## 2023-07-18 DIAGNOSIS — Z1231 Encounter for screening mammogram for malignant neoplasm of breast: Secondary | ICD-10-CM

## 2023-07-18 DIAGNOSIS — E785 Hyperlipidemia, unspecified: Secondary | ICD-10-CM | POA: Diagnosis not present

## 2023-07-18 DIAGNOSIS — G90512 Complex regional pain syndrome I of left upper limb: Secondary | ICD-10-CM

## 2023-07-18 DIAGNOSIS — J9611 Chronic respiratory failure with hypoxia: Secondary | ICD-10-CM

## 2023-07-18 MED ORDER — ALENDRONATE SODIUM 70 MG PO TABS: 70.0000 mg | ORAL_TABLET | ORAL | 11 refills | Status: DC

## 2023-07-18 NOTE — Assessment & Plan Note (Signed)
Referred to [facility] pain clinic - Dr. Jake Church Has tried stellate blocks without much relief Has tried Lyrica and Cymbalta as well

## 2023-07-18 NOTE — Assessment & Plan Note (Signed)
Noted on CT chest On aspirin and statin 

## 2023-07-18 NOTE — Assessment & Plan Note (Signed)
Due to COPD, has quit smoking more than 10 years ago ?Has portable O2 as needed ?

## 2023-07-18 NOTE — Assessment & Plan Note (Addendum)
History of compression fracture of thoracic and lumbar vertebra s/p kyphoplasty Did not tolerate bisphosphonate in the past, but prefers to try again On alendronate Last DEXA scan reviewed -would prefer to start Prolia or Forteo - referred to osteoporosis clinic Continue calcium and vitamin D supplements

## 2023-07-18 NOTE — Assessment & Plan Note (Signed)
Well-controlled with Breztri Uses as needed albuterol Has portable O2 as needed 

## 2023-07-18 NOTE — Assessment & Plan Note (Signed)
Continue with vitamin D 5000 IU daily only

## 2023-07-18 NOTE — Assessment & Plan Note (Signed)
On statin.

## 2023-07-18 NOTE — Assessment & Plan Note (Signed)
Physical exam as documented. Fasting blood tests today. Flu vaccine today. Advised to get Shingrix vaccine at local pharmacy.

## 2023-07-18 NOTE — Patient Instructions (Signed)
Please continue to take medications as prescribed.  Please continue to follow low carb diet and perform moderate exercise/walking at least 150 mins/week.  You are being referred to Osteoporosis clinic in [place].

## 2023-07-18 NOTE — Assessment & Plan Note (Signed)
Lab Results  Component Value Date   HGBA1C 5.8 (H) 07/15/2022   Advised to follow low-carb diet for now

## 2023-07-18 NOTE — Progress Notes (Signed)
Established Patient Office Visit  Subjective:  Patient ID: Abigail Gill, female    DOB: 1961/03/28  Age: 62 y.o. MRN: [id number]  CC:  Chief Complaint  Patient presents with   Hand Pain    Wants to go back to the pain clinic with Dr Sherryll Burger for her hand pain    Annual Exam    HPI Abigail Gill is a 62 y.o. female with past medical history of atherosclerosis of aorta, HLD, COPD, osteoporosis with history of thoracic and lumbar compression fractures and environmental allergies who presents for annual physical.  She has chronic left upper extremity pain since left wrist internal fixation surgery in 2021, complicated by complex regional pain syndrome.  She used to see [facility] pain clinic, and had sympathetic block without much relief.  She has tried Lyrica and Cymbalta without much relief.  She was later placed on Percocet, but has not follow-up now.  She requests referral to same pain clinic.  She has history of osteoporosis and compression fractures of thoracic and lumbar vertebra.  She is taking alendronate now.  Her last DEXA scan showed persistent osteoporosis, for which she is currently taking calcium and vitamin D supplement.  She was not able to afford Prolia.  COPD: She has noticed improvement in her cough and dyspnea with Breztri.  She also uses albuterol inhaler as needed for dyspnea or wheezing.  She also uses O2 as needed for dyspnea, at bedtime and upon exertion.   O2 sat on room air: 90% O2 sat with ambulation on room air: 87% O2 sat with ambulation on 2 LPM O2: 92%   She reports chronic low back pain, from old compression fracture of lumbar spine.  She has tried taking Tylenol, but has resistant pain at times.  Denies any numbness or tingling of the LE.  Denies saddle anesthesia, urinary or stool incontinence.    Past Medical History:  Diagnosis Date   Aortic atherosclerosis (HCC)    Asthma    Back pain    Closed fracture of left distal radius and ulna  12/03/2019   Last Assessment & Plan:   Formatting of this note might be different from the original.  Treatment:  1.  I agreed to let patient stay home for 2 weeks and do her home exercises only however I wish to see the patient in 2 weeks to verify she is continue to improve her finger range of motion and wrist range of motion.  If she is not showing significant improvement at that time we will send her back    Complex regional pain syndrome type 1 of left upper extremity 03/14/2020   COPD (chronic obstructive pulmonary disease) (HCC)    Former cigarette smoker 07/07/2017   GERD (gastroesophageal reflux disease)    Hyperlipidemia 02/15/2021   Osteoporosis    Shortness of breath    with exertion   Vitamin D deficiency     Past Surgical History:  Procedure Laterality Date   BREAST BIOPSY Left    benign   BREAST ENHANCEMENT SURGERY  1996   bilateral-[place]   BREAST EXCISIONAL BIOPSY Left    benign   BREAST LUMPECTOMY  08/30/2011   Procedure: LUMPECTOMY;  Surgeon: Fabio Bering;  Location: [facility];  Service: General;  Laterality: Left;   KYPHOPLASTY  2018   TUBAL LIGATION  1985   [place]    Family History  Problem Relation Age of Onset   Alcohol abuse Mother    Arthritis  Mother    COPD Mother    Hyperlipidemia Mother    Pancreatitis Mother    Heart failure Father    Alcohol abuse Father    Arthritis Father    Diabetes Father    Heart disease Father        several heart attacks   Hyperlipidemia Father    Hypertension Father    Kidney disease Father    Stroke Father        several strokes   COPD Sister    Hypertension Sister    Anesthesia problems Neg Hx    Hypotension Neg Hx    Malignant hyperthermia Neg Hx    Pseudochol deficiency Neg Hx     Social History   Socioeconomic History   Marital status: Married    Spouse name: [name]   Number of children: 3   Years of education: 11   Highest education level: Not on file  Occupational History    Occupation: disabled    Comment: COPD  Tobacco Use   Smoking status: Former    Current packs/day: 0.00    Average packs/day: 1 pack/day for 32.6 years (32.6 ttl pk-yrs)    Types: Cigarettes    Start date: 09/16/1976    Quit date: 04/18/2009    Years since quitting: 14.2    Passive exposure: Never   Smokeless tobacco: Never  Vaping Use   Vaping status: Never Used  Substance and Sexual Activity   Alcohol use: No    Alcohol/week: 0.0 standard drinks of alcohol   Drug use: No   Sexual activity: Yes  Other Topics Concern   Not on file  Social History Narrative   Disabled due to COPD   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   1 chihuahua.   Social Determinants of Health   Financial Resource Strain: Low Risk  (07/14/2023)   Overall Financial Resource Strain (CARDIA)    Difficulty of Paying Living Expenses: Not very hard  Food Insecurity: No Food Insecurity (07/14/2023)   Hunger Vital Sign    Worried About Running Out of Food in the Last Year: Never true    Ran Out of Food in the Last Year: Never true  Transportation Needs: No Transportation Needs (07/14/2023)   PRAPARE - Administrator, Civil Service (Medical): No    Lack of Transportation (Non-Medical): No  Physical Activity: Inactive (07/14/2023)   Exercise Vital Sign    Days of Exercise per Week: 0 days    Minutes of Exercise per Session: 0 min  Stress: No Stress Concern Present (07/14/2023)   [employer] of Occupational Health - Occupational Stress Questionnaire    Feeling of Stress : Not at all  Social Connections: Socially Isolated (07/14/2023)   Social Connection and Isolation Panel [NHANES]    Frequency of Communication with Friends and Family: More than three times a week    Frequency of Social Gatherings with Friends and Family: Twice a week    Attends Religious Services: Never    Database administrator or Organizations: No    Attends Banker  Meetings: Never    Marital Status: Separated  Intimate Partner Violence: Not At Risk (07/14/2023)   Humiliation, Afraid, Rape, and Kick questionnaire    Fear of Current or Ex-Partner: No    Emotionally Abused: No    Physically Abused: No    Sexually Abused: No    Outpatient Medications Prior to  Visit  Medication Sig Dispense Refill   albuterol (VENTOLIN HFA) 108 (90 Base) MCG/ACT inhaler Inhale 2 puffs into the lungs every 4 (four) hours as needed. For shortness of breath 18 g 2   aspirin 81 MG chewable tablet Chew 1 tablet by mouth daily.     atorvastatin (LIPITOR) 10 MG tablet Take 1 tablet (10 mg total) by mouth at bedtime. 90 tablet 3   Budeson-Glycopyrrol-Formoterol (BREZTRI AEROSPHERE) 160-9-4.8 MCG/ACT AERO Inhale 2 puffs into the lungs 2 (two) times daily. 10.7 g 11   cetirizine (ZYRTEC) 10 MG tablet Take 10 mg by mouth as needed.     cholecalciferol (VITAMIN D3) 25 MCG (1000 UT) tablet Take 5,000 Units by mouth daily.     meloxicam (MOBIC) 15 MG tablet Take 1 tablet (15 mg total) by mouth daily. 30 tablet 0   ondansetron (ZOFRAN) 4 MG tablet Take 1 tablet (4 mg total) by mouth every 8 (eight) hours as needed for nausea or vomiting. 20 tablet 0   alendronate (FOSAMAX) 70 MG tablet Take 1 tablet (70 mg total) by mouth every 7 (seven) days. Take with a full glass of water on an empty stomach. 4 tablet 11   No facility-administered medications prior to visit.    Allergies  Allergen Reactions   Pregabalin Other (See Comments)    Headaches    ROS Review of Systems  Constitutional:  Negative for chills and fever.  HENT:  Negative for congestion, sinus pressure and sore throat.   Eyes:  Negative for pain and discharge.  Respiratory:  Positive for cough and shortness of breath (Chronic, intermittent).   Cardiovascular:  Negative for chest pain and palpitations.  Gastrointestinal:  Negative for abdominal pain, diarrhea, nausea and vomiting.  Endocrine: Negative for polydipsia  and polyuria.  Genitourinary:  Negative for dysuria and hematuria.  Musculoskeletal:  Positive for arthralgias, back pain and gait problem. Negative for neck pain and neck stiffness.       Left upper extremity pain  Skin:  Negative for rash.  Neurological:  Negative for dizziness and weakness.  Psychiatric/Behavioral:  Negative for agitation and behavioral problems.       Objective:    Physical Exam Vitals reviewed.  Constitutional:      General: She is not in acute distress.    Appearance: She is not diaphoretic.  HENT:     Head: Normocephalic and atraumatic.     Nose: No congestion.     Mouth/Throat:     Mouth: Mucous membranes are moist.  Eyes:     General: No scleral icterus.    Extraocular Movements: Extraocular movements intact.  Cardiovascular:     Rate and Rhythm: Normal rate and regular rhythm.     Heart sounds: Normal heart sounds. No murmur heard. Pulmonary:     Breath sounds: Normal breath sounds. No wheezing or rales.  Abdominal:     Palpations: Abdomen is soft.     Tenderness: There is no abdominal tenderness.  Musculoskeletal:     Cervical back: Neck supple. No tenderness.     Lumbar back: Tenderness present. Decreased range of motion.     Right lower leg: No edema.     Left lower leg: No edema.  Skin:    General: Skin is warm.     Findings: No rash.  Neurological:     General: No focal deficit present.     Mental Status: She is alert and oriented to person, place, and time.     Cranial  Nerves: No cranial nerve deficit.     Sensory: No sensory deficit.     Motor: No weakness.  Psychiatric:        Mood and Affect: Mood normal.        Behavior: Behavior normal.     BP 117/80   Pulse 68   Resp 16   Ht 5\' 2"  (1.575 m)   Wt 163 lb (73.9 kg)   SpO2 93%   BMI 29.81 kg/m  Wt Readings from Last 3 Encounters:  07/18/23 163 lb (73.9 kg)  07/14/23 157 lb (71.2 kg)  01/09/23 157 lb 12.8 oz (71.6 kg)    Lab Results  Component Value Date   TSH  1.760 07/15/2022   Lab Results  Component Value Date   WBC 6.4 07/15/2022   HGB 13.8 07/15/2022   HCT 42.1 07/15/2022   MCV 89 07/15/2022   PLT 263 07/15/2022   Lab Results  Component Value Date   NA 142 01/09/2023   K 4.7 01/09/2023   CO2 22 01/09/2023   GLUCOSE 90 01/09/2023   BUN 14 01/09/2023   CREATININE 0.79 01/09/2023   BILITOT 0.5 01/09/2023   ALKPHOS 120 01/09/2023   AST 18 01/09/2023   ALT 23 01/09/2023   PROT 6.7 01/09/2023   ALBUMIN 4.3 01/09/2023   CALCIUM 9.1 01/09/2023   ANIONGAP 11 12/26/2017   EGFR 83 01/09/2023   Lab Results  Component Value Date   CHOL 171 07/15/2022   Lab Results  Component Value Date   HDL 72 07/15/2022   Lab Results  Component Value Date   LDLCALC 83 07/15/2022   Lab Results  Component Value Date   TRIG 88 07/15/2022   Lab Results  Component Value Date   CHOLHDL 2.4 07/15/2022   Lab Results  Component Value Date   HGBA1C 5.8 (H) 07/15/2022      Assessment & Plan:   Problem List Items Addressed This Visit       Cardiovascular and Mediastinum   Aortic atherosclerosis (HCC)    Noted on CT chest On aspirin and statin        Respiratory   Panlobular emphysema (HCC)    Well-controlled with Breztri Uses as needed albuterol Has portable O2 as needed      Chronic respiratory failure (HCC)    Due to COPD, has quit smoking more than 10 years ago Has portable O2 as needed        Nervous and Auditory   Complex regional pain syndrome type 1 of left upper extremity    Referred to [facility] pain clinic - Dr. Jake Church Has tried stellate blocks without much relief Has tried Lyrica and Cymbalta as well      Relevant Orders   Ambulatory referral to Pain Clinic   TSH   CMP14+EGFR   CBC with Differential/Platelet     Musculoskeletal and Integument   Osteoporosis with pathological fracture with routine healing    History of compression fracture of thoracic and lumbar vertebra s/p kyphoplasty Did not  tolerate bisphosphonate in the past, but prefers to try again On alendronate Last DEXA scan reviewed -would prefer to start Prolia or Forteo - referred to osteoporosis clinic Continue calcium and vitamin D supplements      Relevant Medications   alendronate (FOSAMAX) 70 MG tablet   Other Relevant Orders   Amb Referral to Osteoporosis Management      Other   Vitamin D deficiency    Continue with vitamin D  5000 IU daily only      Hyperlipidemia    On statin      Relevant Orders   Lipid panel   Encounter for general adult medical examination with abnormal findings - Primary    Physical exam as documented. Fasting blood tests today. Flu vaccine today. Advised to get Shingrix vaccine at local pharmacy.      Prediabetes    Lab Results  Component Value Date   HGBA1C 5.8 (H) 07/15/2022   Advised to follow low-carb diet for now      Relevant Orders   Hemoglobin A1c   CMP14+EGFR   Other Visit Diagnoses     Encounter for immunization       Relevant Orders   Flu vaccine trivalent PF, 6mos and older(Flulaval,Afluria,Fluarix,Fluzone) (Completed)       Meds ordered this encounter  Medications   alendronate (FOSAMAX) 70 MG tablet    Sig: Take 1 tablet (70 mg total) by mouth every 7 (seven) days. Take with a full glass of water on an empty stomach.    Dispense:  4 tablet    Refill:  11    Follow-up: No follow-ups on file.    Anabel Halon, MD

## 2023-07-19 LAB — CMP14+EGFR
ALT: 16 [IU]/L (ref 0–32)
AST: 18 [IU]/L (ref 0–40)
Albumin: 4.2 g/dL (ref 3.9–4.9)
Alkaline Phosphatase: 118 [IU]/L (ref 44–121)
BUN/Creatinine Ratio: 18 (ref 12–28)
BUN: 12 mg/dL (ref 8–27)
Bilirubin Total: 0.5 mg/dL (ref 0.0–1.2)
CO2: 21 mmol/L (ref 20–29)
Calcium: 9.5 mg/dL (ref 8.7–10.3)
Chloride: 101 mmol/L (ref 96–106)
Creatinine, Ser: 0.68 mg/dL (ref 0.57–1.00)
Globulin, Total: 2.3 g/dL (ref 1.5–4.5)
Glucose: 91 mg/dL (ref 70–99)
Potassium: 4.1 mmol/L (ref 3.5–5.2)
Sodium: 140 mmol/L (ref 134–144)
Total Protein: 6.5 g/dL (ref 6.0–8.5)
eGFR: 97 mL/min/{1.73_m2} (ref >59)

## 2023-07-19 LAB — CBC WITH DIFFERENTIAL/PLATELET
Basophils Absolute: 0 10*3/uL (ref 0.0–0.2)
Basos: 1 %
EOS (ABSOLUTE): 0.1 10*3/uL (ref 0.0–0.4)
Eos: 1 %
Hematocrit: 40 % (ref 34.0–46.6)
Hemoglobin: 13.3 g/dL (ref 11.1–15.9)
Immature Grans (Abs): 0 10*3/uL (ref 0.0–0.1)
Immature Granulocytes: 1 %
Lymphocytes Absolute: 1.4 10*3/uL (ref 0.7–3.1)
Lymphs: 22 %
MCH: 29.6 pg (ref 26.6–33.0)
MCHC: 33.3 g/dL (ref 31.5–35.7)
MCV: 89 fL (ref 79–97)
Monocytes Absolute: 0.4 10*3/uL (ref 0.1–0.9)
Monocytes: 6 %
Neutrophils Absolute: 4.5 10*3/uL (ref 1.4–7.0)
Neutrophils: 69 %
Platelets: 257 10*3/uL (ref 150–450)
RBC: 4.49 x10E6/uL (ref 3.77–5.28)
RDW: 12.7 % (ref 11.7–15.4)
WBC: 6.5 10*3/uL (ref 3.4–10.8)

## 2023-07-19 LAB — HEMOGLOBIN A1C
Est. average glucose Bld gHb Est-mCnc: 123 mg/dL
Hgb A1c MFr Bld: 5.9 % — ABNORMAL HIGH (ref 4.8–5.6)

## 2023-07-19 LAB — LIPID PANEL
Chol/HDL Ratio: 2.2 ratio (ref 0.0–4.4)
Cholesterol, Total: 166 mg/dL (ref 100–199)
HDL: 75 mg/dL (ref >39)
LDL Chol Calc (NIH): 77 mg/dL (ref 0–99)
Triglycerides: 73 mg/dL (ref 0–149)
VLDL Cholesterol Cal: 14 mg/dL (ref 5–40)

## 2023-07-19 LAB — TSH: TSH: 1.64 u[IU]/mL (ref 0.450–4.500)

## 2023-07-24 ENCOUNTER — Other Ambulatory Visit (HOSPITAL_COMMUNITY): Payer: Self-pay | Admitting: Internal Medicine

## 2023-07-24 DIAGNOSIS — N6310 Unspecified lump in the right breast, unspecified quadrant: Secondary | ICD-10-CM

## 2023-07-24 DIAGNOSIS — R928 Other abnormal and inconclusive findings on diagnostic imaging of breast: Secondary | ICD-10-CM

## 2023-07-28 ENCOUNTER — Telehealth: Payer: Self-pay | Admitting: Internal Medicine

## 2023-07-28 ENCOUNTER — Ambulatory Visit: Payer: Medicare Other | Admitting: Physician Assistant

## 2023-07-28 NOTE — Telephone Encounter (Signed)
Patient needs for Dr Allena Katz to call dr Clelia Croft she has not gotten a call from [facility] office yet.

## 2023-07-29 ENCOUNTER — Ambulatory Visit: Payer: Medicare Other | Admitting: Physician Assistant

## 2023-07-29 DIAGNOSIS — G90512 Complex regional pain syndrome I of left upper limb: Secondary | ICD-10-CM | POA: Diagnosis not present

## 2023-07-29 DIAGNOSIS — Z79891 Long term (current) use of opiate analgesic: Secondary | ICD-10-CM | POA: Diagnosis not present

## 2023-08-01 ENCOUNTER — Ambulatory Visit (HOSPITAL_COMMUNITY): Payer: Medicare Other

## 2023-08-01 ENCOUNTER — Other Ambulatory Visit (HOSPITAL_COMMUNITY): Payer: Medicare Other

## 2023-08-08 ENCOUNTER — Telehealth: Payer: Self-pay | Admitting: Cardiology

## 2023-08-08 DIAGNOSIS — I700 Atherosclerosis of aorta: Secondary | ICD-10-CM

## 2023-08-08 DIAGNOSIS — E785 Hyperlipidemia, unspecified: Secondary | ICD-10-CM

## 2023-08-08 MED ORDER — ATORVASTATIN CALCIUM 10 MG PO TABS: 10.0000 mg | ORAL_TABLET | Freq: Every day | ORAL | 1 refills | Status: DC

## 2023-08-08 NOTE — Telephone Encounter (Signed)
*  STAT* If patient is at the pharmacy, call can be transferred to refill team.   1. Which medications need to be refilled? (please list name of each medication and dose if known)  atorvastatin (LIPITOR) 10 MG tablet  2. Which pharmacy/location (including street and city if local pharmacy) is medication to be sent to? [facility] [address], [place] - [address]  3. Do they need a 30 day or 90 day supply?   90 day supply

## 2023-08-08 NOTE — Telephone Encounter (Signed)
Done

## 2023-09-04 ENCOUNTER — Ambulatory Visit (HOSPITAL_COMMUNITY)
Admission: RE | Admit: 2023-09-04 | Discharge: 2023-09-04 | Disposition: A | Discharge status: Home / Self Care | Payer: Medicare Other | Source: Ambulatory Visit | Attending: Internal Medicine | Admitting: Internal Medicine

## 2023-09-04 ENCOUNTER — Other Ambulatory Visit: Payer: Self-pay | Admitting: Internal Medicine

## 2023-09-04 ENCOUNTER — Encounter (HOSPITAL_COMMUNITY): Payer: Self-pay

## 2023-09-04 DIAGNOSIS — M8000XD Age-related osteoporosis with current pathological fracture, unspecified site, subsequent encounter for fracture with routine healing: Secondary | ICD-10-CM | POA: Insufficient documentation

## 2023-09-04 DIAGNOSIS — N6310 Unspecified lump in the right breast, unspecified quadrant: Secondary | ICD-10-CM

## 2023-09-04 DIAGNOSIS — Z7983 Long term (current) use of bisphosphonates: Secondary | ICD-10-CM | POA: Diagnosis not present

## 2023-09-04 DIAGNOSIS — R928 Other abnormal and inconclusive findings on diagnostic imaging of breast: Secondary | ICD-10-CM

## 2023-09-04 DIAGNOSIS — Z780 Asymptomatic menopausal state: Secondary | ICD-10-CM | POA: Diagnosis not present

## 2023-09-04 DIAGNOSIS — M810 Age-related osteoporosis without current pathological fracture: Secondary | ICD-10-CM | POA: Diagnosis not present

## 2023-09-04 DIAGNOSIS — N6315 Unspecified lump in the right breast, overlapping quadrants: Secondary | ICD-10-CM | POA: Diagnosis not present

## 2023-09-04 DIAGNOSIS — N6311 Unspecified lump in the right breast, upper outer quadrant: Secondary | ICD-10-CM | POA: Diagnosis not present

## 2023-09-04 DIAGNOSIS — N6312 Unspecified lump in the right breast, upper inner quadrant: Secondary | ICD-10-CM | POA: Diagnosis not present

## 2023-09-04 DIAGNOSIS — N6001 Solitary cyst of right breast: Secondary | ICD-10-CM | POA: Diagnosis not present

## 2023-09-06 DIAGNOSIS — R059 Cough, unspecified: Secondary | ICD-10-CM | POA: Diagnosis not present

## 2023-09-06 DIAGNOSIS — Z885 Allergy status to narcotic agent status: Secondary | ICD-10-CM | POA: Diagnosis not present

## 2023-09-06 DIAGNOSIS — K219 Gastro-esophageal reflux disease without esophagitis: Secondary | ICD-10-CM | POA: Diagnosis not present

## 2023-09-06 DIAGNOSIS — J4489 Other specified chronic obstructive pulmonary disease: Secondary | ICD-10-CM | POA: Diagnosis not present

## 2023-09-06 DIAGNOSIS — R0781 Pleurodynia: Secondary | ICD-10-CM | POA: Diagnosis not present

## 2023-09-06 DIAGNOSIS — Z79899 Other long term (current) drug therapy: Secondary | ICD-10-CM | POA: Diagnosis not present

## 2023-09-06 DIAGNOSIS — M810 Age-related osteoporosis without current pathological fracture: Secondary | ICD-10-CM | POA: Diagnosis not present

## 2023-09-23 ENCOUNTER — Telehealth: Payer: Self-pay | Admitting: Cardiology

## 2023-09-23 ENCOUNTER — Ambulatory Visit: Payer: Medicare Other | Attending: Nurse Practitioner | Admitting: Nurse Practitioner

## 2023-09-23 VITALS — BP 124/82 | HR 99 | Ht 62.0 in | Wt 163.4 lb

## 2023-09-23 DIAGNOSIS — I700 Atherosclerosis of aorta: Secondary | ICD-10-CM

## 2023-09-23 DIAGNOSIS — J449 Chronic obstructive pulmonary disease, unspecified: Secondary | ICD-10-CM

## 2023-09-23 DIAGNOSIS — R0609 Other forms of dyspnea: Secondary | ICD-10-CM | POA: Diagnosis not present

## 2023-09-23 DIAGNOSIS — E785 Hyperlipidemia, unspecified: Secondary | ICD-10-CM | POA: Diagnosis not present

## 2023-09-23 DIAGNOSIS — I2510 Atherosclerotic heart disease of native coronary artery without angina pectoris: Secondary | ICD-10-CM | POA: Diagnosis not present

## 2023-09-23 DIAGNOSIS — R002 Palpitations: Secondary | ICD-10-CM

## 2023-09-23 NOTE — Patient Instructions (Addendum)
 Medication Instructions:  Your physician recommends that you continue on your current medications as directed. Please refer to the Current Medication list given to you today.  Labwork: 1 week   Testing/Procedures: Your physician has requested that you have an echocardiogram. Echocardiography is a painless test that uses sound waves to create images of your heart. It provides your doctor with information about the size and shape of your heart and how well your heart's chambers and valves are working. This procedure takes approximately one hour. There are no restrictions for this procedure. Please do NOT wear cologne, perfume, aftershave, or lotions (deodorant is allowed). Please arrive 15 minutes prior to your appointment time.  Please note: We ask at that you not bring children with you during ultrasound (echo/ vascular) testing. Due to room size and safety concerns, children are not allowed in the ultrasound rooms during exams. Our front office staff cannot provide observation of children in our lobby area while testing is being conducted. An adult accompanying a patient to their appointment will only be allowed in the ultrasound room at the discretion of the ultrasound technician under special circumstances. We apologize for any inconvenience.  Follow-Up: Your physician recommends that you schedule a follow-up appointment in: 4-6 months   Any Other Special Instructions Will Be Listed Below (If Applicable).  If you need a refill on your cardiac medications before your next appointment, please call your pharmacy.

## 2023-09-23 NOTE — Progress Notes (Addendum)
 Cardiology Office Note:  .   Date:  09/23/2023  ID:  Abigail Gill, DOB [birth year], MRN [id number] PCP: Tobie Suzzane POUR, MD  [facility] Providers Cardiologist:  Alvan Carrier, MD    History of Present Illness: .   Abigail Gill is a 63 y.o. female with a PMH of coronary atherosclerosis, aortic atherosclerosis, prediabetes, hyperlipidemia, former smoker, GERD, and COPD, presents today for 1 year follow-up.  Past incidental finding of coronary atherosclerosis on CT chest along LM and multivessel atherosclerosis and past NST without ischemia.   Last seen by Dr. Carrier Alvan on July 05, 2022. She was overall doing well at the time.   Today she presents for overdue 1 year follow-up. She states she is overall doing well.  She admits to chronic, stable shortness of breath with exertion.  Wears 2 L of oxygen  at night and also has oxygen  for when she travels.  She has been using oxygen  therapy for the past 10 years.  Sometimes notices some elevated heart rates/palpitations, rare per her report and not bothersome.  She says she does not check her heart rate at home. Denies any chest pain, syncope, presyncope, dizziness, orthopnea, PND, swelling or significant weight changes, acute bleeding, or claudication.  ROS: Negative.  See HPI. FH: She says her father died of heart failure.  Mom had CABG x 3 as well as heart failure.  Studies Reviewed: SABRA    EKG: EKG Interpretation Date/Time:  Tuesday September 23 2023 14:00:46 EST Ventricular Rate:  99 PR Interval:  132 QRS Duration:  64 QT Interval:  340 QTC Calculation: 436 R Axis:   76  Text Interpretation: Normal sinus rhythm Nonspecific ST abnormality When compared with ECG of 26-Dec-2017 04:57, PREVIOUS ECG IS PRESENT Confirmed by Miriam Norris [phone]) on 09/23/2023 2:03:52 PM   Lexiscan  05/2021:   The study is low risk.   No ST deviation was noted.   LV perfusion is abnormal. There is a small mild intensity baseal to mid inferior  defect with minimal reversibility and normal wall motion. May reflect slight differences in subdiaphragmatic attenuation artifact, cannot completely exclude inferior infarct with very mild peri-infarct ischemia. Either finding would support low risk   Left ventricular function is normal.  Echocardiogram 04/2015: - Procedure narrative: Transthoracic echocardiography. Image    quality was suboptimal, with poor valvular visualization.  - Left ventricle: The cavity size was normal. Wall thickness was    normal. Systolic function was vigorous. The estimated ejection    fraction was in the range of 65% to 70%. Images were inadequate    for LV wall motion assessment. No obvious abnormalties on    apical-4 chamber views. Doppler parameters are consistent with    abnormal left ventricular relaxation (grade 1 diastolic    dysfunction).  Risk Assessment/Calculations:       The 10-year ASCVD risk score (Arnett DK, et al., 2019) is: 3.7%   Values used to calculate the score:     Age: 50 years     Sex: Female     Is Non-Hispanic African American: No     Diabetic: No     Tobacco smoker: No     Systolic Blood Pressure: 124 mmHg     Is BP treated: No     HDL Cholesterol: 75 mg/dL     Total Cholesterol: 166 mg/dL      Physical Exam:   VS:  BP 124/82 (BP Location: Left Arm, Cuff Size: Normal)   Pulse 99  Ht 5' 2 (1.575 m)   Wt 163 lb 6.4 oz (74.1 kg)   SpO2 92%   BMI 29.89 kg/m    Wt Readings from Last 3 Encounters:  09/23/23 163 lb 6.4 oz (74.1 kg)  07/18/23 163 lb (73.9 kg)  07/14/23 157 lb (71.2 kg)    GEN: Well nourished, well developed in no acute distress NECK: No JVD; No carotid bruits CARDIAC: S1/S2, RRR, no murmurs, rubs, gallops RESPIRATORY:  Clear and diminished to auscultation without rales, wheezing or rhonchi  ABDOMEN: Soft, non-tender, non-distended EXTREMITIES:  No edema; No deformity   ASSESSMENT AND PLAN: .    Coronary atherosclerosis, aortic atherosclerosis Denies  any chest pain or anginal symptoms.  No indication for ischemic evaluation at this time.  Lexiscan  in 2022 was reassuring.  Continue aspirin  and atorvastatin . Heart healthy diet and regular cardiovascular exercise encouraged.  Care and ED precautions discussed.  2.  Palpitations Admits to rare episodes of palpitations at times and not bothersome per her report, does not monitor heart rate at home.  Offered/recommended to arrange monitor for further evaluation, patient declines at this time. Heart healthy diet and regular cardiovascular exercise encouraged. Care and ED precautions discussed.  Hyperlipidemia LDL in November 2024 was 77.  Continue atorvastatin .  This is being managed by her PCP. Continue to follow with PCP.  COPD, DOE Longstanding history and has been using oxygen  for the past 10 years.  She admits to chronic, stable shortness of breath with exertion.  Euvolemic and well compensated on exam, however heart disease including heart failure runs in her family.  She says both of her parents were affected by heart failure.  Will obtain proBNP and update echocardiogram at this time.    Dispo: Follow-up with Dr. Dorn Ross or APP in 4 to 6 months or sooner if anything changes.  Signed, Almarie Crate, NP

## 2023-09-23 NOTE — Telephone Encounter (Signed)
 Pre-cert Verification for the following procedure    Echo scheduled 1/13

## 2023-09-24 DIAGNOSIS — I700 Atherosclerosis of aorta: Secondary | ICD-10-CM | POA: Diagnosis not present

## 2023-09-24 DIAGNOSIS — I2510 Atherosclerotic heart disease of native coronary artery without angina pectoris: Secondary | ICD-10-CM | POA: Diagnosis not present

## 2023-09-24 DIAGNOSIS — R0609 Other forms of dyspnea: Secondary | ICD-10-CM | POA: Diagnosis not present

## 2023-09-26 LAB — BRAIN NATRIURETIC PEPTIDE: BNP: 24.8 pg/mL (ref 0.0–100.0)

## 2023-09-29 ENCOUNTER — Ambulatory Visit: Payer: Medicare Other | Attending: Nurse Practitioner

## 2023-09-29 DIAGNOSIS — I700 Atherosclerosis of aorta: Secondary | ICD-10-CM

## 2023-09-29 DIAGNOSIS — I2510 Atherosclerotic heart disease of native coronary artery without angina pectoris: Secondary | ICD-10-CM | POA: Diagnosis not present

## 2023-09-29 DIAGNOSIS — R0609 Other forms of dyspnea: Secondary | ICD-10-CM

## 2023-09-29 LAB — ECHOCARDIOGRAM COMPLETE
AR max vel: 3.2 cm2
AV Area VTI: 2.92 cm2
AV Area mean vel: 2.77 cm2
AV Mean grad: 5 mm[Hg]
AV Peak grad: 8.2 mm[Hg]
Ao pk vel: 1.43 m/s
Area-P 1/2: 3.45 cm2
Calc EF: 78.6 %
MV VTI: 4.31 cm2
S' Lateral: 2.6 cm
Single Plane A2C EF: 83.9 %
Single Plane A4C EF: 71.5 %

## 2023-09-29 MED ORDER — PERFLUTREN LIPID MICROSPHERE
1.0000 mL | INTRAVENOUS | Status: AC | PRN
  Administered 2023-09-29: 6 mL via INTRAVENOUS

## 2023-10-23 DIAGNOSIS — G90512 Complex regional pain syndrome I of left upper limb: Secondary | ICD-10-CM | POA: Diagnosis not present

## 2023-10-23 DIAGNOSIS — Z79891 Long term (current) use of opiate analgesic: Secondary | ICD-10-CM | POA: Diagnosis not present

## 2024-01-11 ENCOUNTER — Encounter (HOSPITAL_COMMUNITY): Payer: Self-pay | Admitting: *Deleted

## 2024-01-11 ENCOUNTER — Other Ambulatory Visit: Payer: Self-pay

## 2024-01-11 ENCOUNTER — Inpatient Hospital Stay (HOSPITAL_COMMUNITY)
Admission: EM | Admit: 2024-01-11 | Discharge: 2024-01-13 | DRG: 193 | Disposition: A | Discharge status: Home / Self Care | Attending: Internal Medicine | Admitting: Internal Medicine

## 2024-01-11 ENCOUNTER — Emergency Department (HOSPITAL_COMMUNITY)

## 2024-01-11 DIAGNOSIS — E876 Hypokalemia: Secondary | ICD-10-CM | POA: Diagnosis not present

## 2024-01-11 DIAGNOSIS — Z7951 Long term (current) use of inhaled steroids: Secondary | ICD-10-CM | POA: Diagnosis not present

## 2024-01-11 DIAGNOSIS — J440 Chronic obstructive pulmonary disease with acute lower respiratory infection: Secondary | ICD-10-CM | POA: Diagnosis present

## 2024-01-11 DIAGNOSIS — Z1152 Encounter for screening for COVID-19: Secondary | ICD-10-CM | POA: Diagnosis not present

## 2024-01-11 DIAGNOSIS — J1001 Influenza due to other identified influenza virus with the same other identified influenza virus pneumonia: Principal | ICD-10-CM | POA: Diagnosis present

## 2024-01-11 DIAGNOSIS — Z825 Family history of asthma and other chronic lower respiratory diseases: Secondary | ICD-10-CM

## 2024-01-11 DIAGNOSIS — M810 Age-related osteoporosis without current pathological fracture: Secondary | ICD-10-CM | POA: Diagnosis present

## 2024-01-11 DIAGNOSIS — Z841 Family history of disorders of kidney and ureter: Secondary | ICD-10-CM

## 2024-01-11 DIAGNOSIS — R7303 Prediabetes: Secondary | ICD-10-CM | POA: Diagnosis not present

## 2024-01-11 DIAGNOSIS — J069 Acute upper respiratory infection, unspecified: Secondary | ICD-10-CM | POA: Diagnosis not present

## 2024-01-11 DIAGNOSIS — Z7983 Long term (current) use of bisphosphonates: Secondary | ICD-10-CM | POA: Diagnosis not present

## 2024-01-11 DIAGNOSIS — E785 Hyperlipidemia, unspecified: Secondary | ICD-10-CM | POA: Diagnosis not present

## 2024-01-11 DIAGNOSIS — Z83438 Family history of other disorder of lipoprotein metabolism and other lipidemia: Secondary | ICD-10-CM

## 2024-01-11 DIAGNOSIS — Z8249 Family history of ischemic heart disease and other diseases of the circulatory system: Secondary | ICD-10-CM

## 2024-01-11 DIAGNOSIS — Z8261 Family history of arthritis: Secondary | ICD-10-CM | POA: Diagnosis not present

## 2024-01-11 DIAGNOSIS — Z79899 Other long term (current) drug therapy: Secondary | ICD-10-CM | POA: Diagnosis not present

## 2024-01-11 DIAGNOSIS — J9621 Acute and chronic respiratory failure with hypoxia: Secondary | ICD-10-CM | POA: Diagnosis present

## 2024-01-11 DIAGNOSIS — Z888 Allergy status to other drugs, medicaments and biological substances status: Secondary | ICD-10-CM

## 2024-01-11 DIAGNOSIS — G8929 Other chronic pain: Secondary | ICD-10-CM | POA: Diagnosis present

## 2024-01-11 DIAGNOSIS — R918 Other nonspecific abnormal finding of lung field: Secondary | ICD-10-CM | POA: Diagnosis not present

## 2024-01-11 DIAGNOSIS — Z87891 Personal history of nicotine dependence: Secondary | ICD-10-CM | POA: Diagnosis not present

## 2024-01-11 DIAGNOSIS — K219 Gastro-esophageal reflux disease without esophagitis: Secondary | ICD-10-CM | POA: Diagnosis present

## 2024-01-11 DIAGNOSIS — Z811 Family history of alcohol abuse and dependence: Secondary | ICD-10-CM | POA: Diagnosis not present

## 2024-01-11 DIAGNOSIS — Z9981 Dependence on supplemental oxygen: Secondary | ICD-10-CM

## 2024-01-11 DIAGNOSIS — Z833 Family history of diabetes mellitus: Secondary | ICD-10-CM

## 2024-01-11 DIAGNOSIS — Z7982 Long term (current) use of aspirin: Secondary | ICD-10-CM | POA: Diagnosis not present

## 2024-01-11 DIAGNOSIS — Z885 Allergy status to narcotic agent status: Secondary | ICD-10-CM | POA: Diagnosis not present

## 2024-01-11 DIAGNOSIS — J441 Chronic obstructive pulmonary disease with (acute) exacerbation: Secondary | ICD-10-CM | POA: Insufficient documentation

## 2024-01-11 DIAGNOSIS — I2510 Atherosclerotic heart disease of native coronary artery without angina pectoris: Secondary | ICD-10-CM | POA: Diagnosis present

## 2024-01-11 DIAGNOSIS — J101 Influenza due to other identified influenza virus with other respiratory manifestations: Secondary | ICD-10-CM | POA: Diagnosis present

## 2024-01-11 DIAGNOSIS — J984 Other disorders of lung: Secondary | ICD-10-CM | POA: Diagnosis not present

## 2024-01-11 DIAGNOSIS — Z823 Family history of stroke: Secondary | ICD-10-CM

## 2024-01-11 DIAGNOSIS — R0602 Shortness of breath: Secondary | ICD-10-CM | POA: Diagnosis not present

## 2024-01-11 DIAGNOSIS — J189 Pneumonia, unspecified organism: Principal | ICD-10-CM

## 2024-01-11 DIAGNOSIS — J40 Bronchitis, not specified as acute or chronic: Secondary | ICD-10-CM | POA: Diagnosis not present

## 2024-01-11 LAB — COMPREHENSIVE METABOLIC PANEL WITH GFR
ALT: 21 U/L (ref 0–44)
AST: 21 U/L (ref 15–41)
Albumin: 3.9 g/dL (ref 3.5–5.0)
Alkaline Phosphatase: 92 U/L (ref 38–126)
Anion gap: 11 (ref 5–15)
BUN: 14 mg/dL (ref 8–23)
CO2: 26 mmol/L (ref 22–32)
Calcium: 8.5 mg/dL — ABNORMAL LOW (ref 8.9–10.3)
Chloride: 100 mmol/L (ref 98–111)
Creatinine, Ser: 0.71 mg/dL (ref 0.44–1.00)
GFR, Estimated: >60 mL/min (ref >60)
Glucose, Bld: 175 mg/dL — ABNORMAL HIGH (ref 70–99)
Potassium: 3.5 mmol/L (ref 3.5–5.1)
Sodium: 137 mmol/L (ref 135–145)
Total Bilirubin: 0.6 mg/dL (ref 0.0–1.2)
Total Protein: 7.3 g/dL (ref 6.5–8.1)

## 2024-01-11 LAB — CBC WITH DIFFERENTIAL/PLATELET
Abs Immature Granulocytes: 0.03 10*3/uL (ref 0.00–0.07)
Basophils Absolute: 0 10*3/uL (ref 0.0–0.1)
Basophils Relative: 0 %
Eosinophils Absolute: 0 10*3/uL (ref 0.0–0.5)
Eosinophils Relative: 0 %
HCT: 42.7 % (ref 36.0–46.0)
Hemoglobin: 13.9 g/dL (ref 12.0–15.0)
Immature Granulocytes: 0 %
Lymphocytes Relative: 4 %
Lymphs Abs: 0.3 10*3/uL — ABNORMAL LOW (ref 0.7–4.0)
MCH: 28.7 pg (ref 26.0–34.0)
MCHC: 32.6 g/dL (ref 30.0–36.0)
MCV: 88 fL (ref 80.0–100.0)
Monocytes Absolute: 0.3 10*3/uL (ref 0.1–1.0)
Monocytes Relative: 4 %
Neutro Abs: 6.7 10*3/uL (ref 1.7–7.7)
Neutrophils Relative %: 92 %
Platelets: 149 10*3/uL — ABNORMAL LOW (ref 150–400)
RBC: 4.85 MIL/uL (ref 3.87–5.11)
RDW: 13.2 % (ref 11.5–15.5)
WBC: 7.2 10*3/uL (ref 4.0–10.5)
nRBC: 0 % (ref 0.0–0.2)

## 2024-01-11 LAB — URINALYSIS, W/ REFLEX TO CULTURE (INFECTION SUSPECTED)
Bacteria, UA: NONE SEEN
Bilirubin Urine: NEGATIVE
Glucose, UA: 150 mg/dL — AB
Ketones, ur: 20 mg/dL — AB
Leukocytes,Ua: NEGATIVE
Nitrite: NEGATIVE
Protein, ur: 30 mg/dL — AB
Specific Gravity, Urine: 1.025 (ref 1.005–1.030)
pH: 5 (ref 5.0–8.0)

## 2024-01-11 LAB — RESP PANEL BY RT-PCR (RSV, FLU A&B, COVID)  RVPGX2
Influenza A by PCR: NEGATIVE
Influenza B by PCR: POSITIVE — AB
Resp Syncytial Virus by PCR: NEGATIVE
SARS Coronavirus 2 by RT PCR: NEGATIVE

## 2024-01-11 LAB — TROPONIN I (HIGH SENSITIVITY): Troponin I (High Sensitivity): 7 ng/L (ref <18)

## 2024-01-11 LAB — LACTIC ACID, PLASMA: Lactic Acid, Venous: 1.1 mmol/L (ref 0.5–1.9)

## 2024-01-11 MED ORDER — ACETAMINOPHEN 500 MG PO TABS: 1000.0000 mg | ORAL_TABLET | Freq: Four times a day (QID) | ORAL | Status: DC | PRN

## 2024-01-11 MED ORDER — SODIUM CHLORIDE 0.9 % IV SOLN
500.0000 mg | Freq: Once | INTRAVENOUS | Status: AC
  Administered 2024-01-11: 500 mg via INTRAVENOUS
  Filled 2024-01-11: qty 5

## 2024-01-11 MED ORDER — AZITHROMYCIN 250 MG PO TABS
500.0000 mg | ORAL_TABLET | Freq: Every day | ORAL | Status: DC
  Administered 2024-01-12: 500 mg via ORAL
  Filled 2024-01-11: qty 2

## 2024-01-11 MED ORDER — SODIUM CHLORIDE 0.9 % IV SOLN
1.0000 g | Freq: Once | INTRAVENOUS | Status: AC
  Administered 2024-01-11: 1 g via INTRAVENOUS
  Filled 2024-01-11: qty 10

## 2024-01-11 MED ORDER — SODIUM CHLORIDE 0.9 % IV BOLUS
500.0000 mL | Freq: Once | INTRAVENOUS | Status: AC
  Administered 2024-01-11: 500 mL via INTRAVENOUS

## 2024-01-11 MED ORDER — ENOXAPARIN SODIUM 40 MG/0.4ML IJ SOSY
40.0000 mg | PREFILLED_SYRINGE | INTRAMUSCULAR | Status: DC
Start: 2024-01-12 — End: 2024-01-13
  Administered 2024-01-12: 40 mg via SUBCUTANEOUS
  Filled 2024-01-11: qty 0.4

## 2024-01-11 MED ORDER — METHYLPREDNISOLONE SODIUM SUCC 125 MG IJ SOLR
125.0000 mg | Freq: Once | INTRAMUSCULAR | Status: AC
  Administered 2024-01-11: 125 mg via INTRAVENOUS
  Filled 2024-01-11: qty 2

## 2024-01-11 MED ORDER — ONDANSETRON HCL 4 MG/2ML IJ SOLN: 4.0000 mg | Freq: Four times a day (QID) | INTRAMUSCULAR | Status: DC | PRN

## 2024-01-11 MED ORDER — ALBUTEROL SULFATE (2.5 MG/3ML) 0.083% IN NEBU: 2.5000 mg | INHALATION_SOLUTION | RESPIRATORY_TRACT | Status: DC | PRN

## 2024-01-11 MED ORDER — IPRATROPIUM-ALBUTEROL 0.5-2.5 (3) MG/3ML IN SOLN
3.0000 mL | Freq: Four times a day (QID) | RESPIRATORY_TRACT | Status: DC
  Administered 2024-01-12 (×4): 3 mL via RESPIRATORY_TRACT
  Filled 2024-01-11 (×4): qty 3

## 2024-01-11 MED ORDER — ALBUTEROL SULFATE (2.5 MG/3ML) 0.083% IN NEBU
INHALATION_SOLUTION | RESPIRATORY_TRACT | Status: AC
  Administered 2024-01-11: 2.5 mg
  Filled 2024-01-11: qty 3

## 2024-01-11 MED ORDER — IPRATROPIUM-ALBUTEROL 0.5-2.5 (3) MG/3ML IN SOLN
3.0000 mL | Freq: Once | RESPIRATORY_TRACT | Status: AC
  Administered 2024-01-11: 3 mL via RESPIRATORY_TRACT
  Filled 2024-01-11: qty 6

## 2024-01-11 MED ORDER — ALBUTEROL SULFATE (2.5 MG/3ML) 0.083% IN NEBU
INHALATION_SOLUTION | RESPIRATORY_TRACT | Status: AC
  Administered 2024-01-11: 10 mg/h via RESPIRATORY_TRACT
  Filled 2024-01-11: qty 6

## 2024-01-11 MED ORDER — SODIUM CHLORIDE 0.9 % IV SOLN
1.0000 g | INTRAVENOUS | Status: DC
  Administered 2024-01-12: 1 g via INTRAVENOUS
  Filled 2024-01-11: qty 10

## 2024-01-11 MED ORDER — SODIUM CHLORIDE 0.9% FLUSH
3.0000 mL | Freq: Two times a day (BID) | INTRAVENOUS | Status: DC
  Administered 2024-01-12 (×3): 3 mL via INTRAVENOUS

## 2024-01-11 MED ORDER — ALBUTEROL SULFATE (2.5 MG/3ML) 0.083% IN NEBU: 10.0000 mg/h | INHALATION_SOLUTION | RESPIRATORY_TRACT | Status: DC

## 2024-01-11 MED ORDER — OSELTAMIVIR PHOSPHATE 75 MG PO CAPS
75.0000 mg | ORAL_CAPSULE | Freq: Two times a day (BID) | ORAL | Status: DC
  Administered 2024-01-12 – 2024-01-13 (×4): 75 mg via ORAL
  Filled 2024-01-11 (×4): qty 1

## 2024-01-11 MED ORDER — IPRATROPIUM-ALBUTEROL 0.5-2.5 (3) MG/3ML IN SOLN
RESPIRATORY_TRACT | Status: AC
  Administered 2024-01-11: 3 mL
  Filled 2024-01-11: qty 3

## 2024-01-11 MED ORDER — POLYETHYLENE GLYCOL 3350 17 G PO PACK: 17.0000 g | PACK | Freq: Every day | ORAL | Status: DC | PRN

## 2024-01-11 MED ORDER — MELATONIN 3 MG PO TABS: 6.0000 mg | ORAL_TABLET | Freq: Every evening | ORAL | Status: DC | PRN

## 2024-01-11 MED ORDER — PREDNISONE 20 MG PO TABS
40.0000 mg | ORAL_TABLET | Freq: Every day | ORAL | Status: DC
  Administered 2024-01-12 – 2024-01-13 (×2): 40 mg via ORAL
  Filled 2024-01-11 (×2): qty 2

## 2024-01-11 NOTE — ED Provider Notes (Signed)
 [place] [facility] AT [facility] Provider Note   CSN: [id number] Arrival date & time: 01/11/24  1746     History  Chief Complaint  Patient presents with   Shortness of Breath    Abigail Gill is a 63 y.o. female.  With past medical history of COPD asthma and hyperlipidemia presenting to emergency room with shortness of breath.  She reports that she has been sick for the past 4 days with cough, congestion.  Reports today she started having some right lower chest pain and significant shortness of breath.  She has not tried her albuterol  treatment but did take prednisone  given to her by urgent care earlier today.  She reports she has had productive cough and feels like she has broken ribs she has been coughing so hard.  Denies any fever or chills.  She is currently requiring 4 L nasal cannula typically at 2 at baseline.  Is not having any extremity swelling or unilateral calf pain.   Shortness of Breath      Home Medications Prior to Admission medications   Medication Sig Start Date End Date Taking? Authorizing Provider  albuterol  (VENTOLIN  HFA) 108 (90 Base) MCG/ACT inhaler Inhale 2 puffs into the lungs every 4 (four) hours as needed. For shortness of breath 01/09/23   Meldon Sport, MD  alendronate  (FOSAMAX ) 70 MG tablet Take 1 tablet (70 mg total) by mouth every 7 (seven) days. Take with a full glass of water on an empty stomach. 07/18/23   Patel, Rutwik K, MD  aspirin 81 MG chewable tablet Chew 1 tablet by mouth daily.    [provider]  atorvastatin  (LIPITOR) 10 MG tablet Take 1 tablet (10 mg total) by mouth at bedtime. 08/08/23   Laurann Pollock, MD  Budeson-Glycopyrrol-Formoterol (BREZTRI  AEROSPHERE) 160-9-4.8 MCG/ACT AERO Inhale 2 puffs into the lungs 2 (two) times daily. 01/09/23   Meldon Sport, MD  cetirizine (ZYRTEC) 10 MG tablet Take 10 mg by mouth as needed.    [provider]  cholecalciferol (VITAMIN D3) 25 MCG (1000 UT)  tablet Take 5,000 Units by mouth daily.    [provider]  meloxicam  (MOBIC ) 15 MG tablet Take 1 tablet (15 mg total) by mouth daily. Patient not taking: Reported on 09/23/2023 01/09/23   Meldon Sport, MD  naloxone [facility]) nasal spray 4 mg/0.1 mL Place 1 spray into the nose. 07/29/23   [provider]  ondansetron  (ZOFRAN ) 4 MG tablet Take 1 tablet (4 mg total) by mouth every 8 (eight) hours as needed for nausea or vomiting. Patient not taking: Reported on 09/23/2023 09/07/21   Meldon Sport, MD  oxyCODONE-acetaminophen  (PERCOCET/ROXICET) 5-325 MG tablet Take 1 tablet by mouth every 6 (six) hours as needed.    [provider]      Allergies    Pregabalin    Review of Systems   Review of Systems  Respiratory:  Positive for shortness of breath.     Physical Exam Updated Vital Signs BP 129/73 (BP Location: Left Arm)   Pulse (!) 136   Temp 99.5 F (37.5 C) (Temporal)   Resp (!) 28   Ht 5\' 2"  (1.575 m)   Wt 72.6 kg   SpO2 90%   BMI 29.26 kg/m  Physical Exam Vitals and nursing note reviewed.  Constitutional:      General: She is not in acute distress.    Appearance: She is not toxic-appearing.  HENT:     Head:  Normocephalic and atraumatic.  Eyes:     General: No scleral icterus.    Conjunctiva/sclera: Conjunctivae normal.  Cardiovascular:     Rate and Rhythm: Regular rhythm. Tachycardia present.     Pulses: Normal pulses.     Heart sounds: Normal heart sounds.  Pulmonary:     Effort: Pulmonary effort is normal. No respiratory distress.     Breath sounds: Normal breath sounds.     Comments: Poor air movement and end exp wheezing.  Chest:     Chest wall: No tenderness.  Abdominal:     General: Abdomen is flat. Bowel sounds are normal.     Palpations: Abdomen is soft.     Tenderness: There is no abdominal tenderness.  Musculoskeletal:     Right lower leg: No edema.     Left lower leg: No edema.  Skin:    General: Skin is warm and dry.      Findings: No lesion.  Neurological:     General: No focal deficit present.     Mental Status: She is alert and oriented to person, place, and time. Mental status is at baseline.     ED Results / Procedures / Treatments   Labs (all labs ordered are listed, but only abnormal results are displayed) Labs Reviewed  RESP PANEL BY RT-PCR (RSV, FLU A&B, COVID)  RVPGX2 - Abnormal; Notable for the following components:      Result Value   Influenza B by PCR POSITIVE (*)    All other components within normal limits  COMPREHENSIVE METABOLIC PANEL WITH GFR - Abnormal; Notable for the following components:   Glucose, Bld 175 (*)    Calcium  8.5 (*)    All other components within normal limits  CBC WITH DIFFERENTIAL/PLATELET - Abnormal; Notable for the following components:   Platelets 149 (*)    Lymphs Abs 0.3 (*)    All other components within normal limits  URINALYSIS, W/ REFLEX TO CULTURE (INFECTION SUSPECTED) - Abnormal; Notable for the following components:   Glucose, UA 150 (*)    Hgb urine dipstick SMALL (*)    Ketones, ur 20 (*)    Protein, ur 30 (*)    All other components within normal limits  CULTURE, BLOOD (ROUTINE X 2)  CULTURE, BLOOD (ROUTINE X 2)  LACTIC ACID, PLASMA  TROPONIN I (HIGH SENSITIVITY)    EKG None  Radiology DG Chest 2 View Result Date: 01/11/2024 CLINICAL DATA:  Shortness of breath. EXAM: CHEST - 2 VIEW COMPARISON:  09/06/2023 FINDINGS: The cardiac silhouette, mediastinal and hilar contours are within normal limits and stable. Underlying emphysematous changes and pulmonary scarring. There are bilateral pulmonary infiltrates notably in the right middle lobe and left lower lobe. No pleural effusions. No pneumothorax. The bony thorax is intact. Remote vertebral augmentation changes noted at T8. IMPRESSION: 1. Bilateral pulmonary infiltrates. 2. Underlying emphysematous changes and pulmonary scarring. Electronically Signed   By: Marrian Siva M.D.   On: 01/11/2024  20:16    Procedures Procedures    Medications Ordered in ED Medications  albuterol  (PROVENTIL ) (2.5 MG/3ML) 0.083% nebulizer solution (10 mg/hr Nebulization New Bag/Given 01/11/24 1838)  cefTRIAXone (ROCEPHIN) 1 g in sodium chloride  0.9 % 100 mL IVPB (1 g Intravenous New Bag/Given 01/11/24 2108)  azithromycin  (ZITHROMAX ) 500 mg in sodium chloride  0.9 % 250 mL IVPB (has no administration in time range)  ipratropium-albuterol  (DUONEB) 0.5-2.5 (3) MG/3ML nebulizer solution 3 mL (3 mLs Nebulization Given 01/11/24 1838)  methylPREDNISolone sodium succinate (SOLU-MEDROL) 125 mg/2  mL injection 125 mg (125 mg Intravenous Given 01/11/24 1844)  sodium chloride  0.9 % bolus 500 mL (500 mLs Intravenous New Bag/Given 01/11/24 2107)  ipratropium-albuterol  (DUONEB) 0.5-2.5 (3) MG/3ML nebulizer solution (3 mLs  Given 01/11/24 2114)  albuterol  (PROVENTIL ) (2.5 MG/3ML) 0.083% nebulizer solution (2.5 mg  Given 01/11/24 2114)    ED Course/ Medical Decision Making/ A&P Clinical Course as of 01/11/24 2334  Sun Jan 11, 2024  2243 Hospitalist agrees to admit.   [JB]    Clinical Course User Index [JB] Isaias Dowson, Kandace Organ, PA-C                                 Medical Decision Making Amount and/or Complexity of Data Reviewed Labs: ordered. Radiology: ordered.  Risk Prescription drug management. Decision regarding hospitalization.   This patient presents to the ED for concern of chest pain/short of breath, this involves an extensive number of treatment options, and is a complaint that carries with it a high risk of complications and morbidity.  The differential diagnosis includes ACS, PE, aortic dissection, pneumonia, pneumothorax, COPD exacerbation, viral URI   Co morbidities that complicate the patient evaluation  COPD Asthma    Additional history obtained:  Additional history obtained from 09/06/23   Lab Tests:  I personally interpreted labs.  The pertinent results include:   CBC without  leukocytosis, no anemia. CMP with hyperglycemia. Lactic 1.1, trop 7. Blood cultures pending.  Resp panel flu b UA negative      Imaging Studies ordered:  I ordered imaging studies including chest x-ray  I independently visualized and interpreted imaging which showed bilateral pneumonia  I agree with the radiologist interpretation   Cardiac Monitoring: / EKG:  The patient was maintained on a cardiac monitor.    Consultations Obtained:  I requested consultation with the hospitalist,  and discussed lab and imaging findings as well as pertinent plan - they recommend: admit   Problem List / ED Course / Critical interventions / Medication management  Reporting to emergency room with complaint of chest pain and shortness of breath.  She has right recent cough congestion and feeling unwell.  Will get respiratory panel to rule out viral illness.  She is also having chest pain so we will rule out ACS with EKG and troponins.  Symptoms are less consistent with aortic dissection.  She does not have any sign of DVT and no history of PE DVT.  She has decreased air movement and end expiratory wheezing consistent with COPD asthma.  Will give DuoNeb and steroid.  She is currently hypoxic requiring more oxygen  than normal and slightly tachycardic thus will get blood cultures. RT called to evaluate.  Patient is still feeling short of breath and feeling weak.  Given comorbidities and continued wheezing feel she needs admitted for COPD exacerbation in the setting of influenza B and pneumonia.  Have given her antibiotics and fluids.  She has received 2 DuoNebs and continuous albuterol . Requiring more O2 than normal. Standing makes oxygen  drop to mid 80's.  I ordered medication including DuoNeb, steriod, albuterol   Reevaluation of the patient after these medicines showed that the patient stayed the same I have reviewed the patients home medicines and have made adjustments as  needed   Plan  admit        Final Clinical Impression(s) / ED Diagnoses Final diagnoses:  Pneumonia of both lungs due to infectious organism, unspecified part of lung  Influenza B    Rx / DC Orders ED Discharge Orders     None         Suzanne Erps Allison Ivory 01/11/24 2334    Sueellen Emery, MD 01/12/24 [phone]

## 2024-01-11 NOTE — ED Notes (Signed)
 ED TO INPATIENT HANDOFF REPORT  ED Nurse Name and Phone #:   S Name/Age/Gender Abigail Gill 63 y.o. female Room/Bed: APA06/APA06  Code Status   Code Status: Full Code  Home/SNF/Other Home Patient oriented to: self, place, time, and situation Is this baseline? Yes   Triage Complete: Triage complete  Chief Complaint Influenza B [J10.1]  Triage Note Pt with SOB today, pt seen at [facility] and was given prescriptions for prednisone  and antibiotic. Pt with home O2, normally at 2 L/M, pt has on 5 L/M.  Pt thinks her throat has swelling. Pt c/o right rib pain, pt believes it may be broken.    Allergies Allergies  Allergen Reactions   Pregabalin Other (See Comments)    Headaches    Level of Care/Admitting Diagnosis ED Disposition     ED Disposition  Admit   Condition  --   Comment  Hospital Area: [facility] [[id number]]  Level of Care: Telemetry [5]  Covid Evaluation: Confirmed COVID Negative  Diagnosis: Influenza B [696789]  Admitting Physician: SEGARS, JONATHAN [[id number]]  Attending Physician: SEGARS, JONATHAN [[id number]]  Certification:: I certify this patient will need inpatient services for at least 2 midnights  Expected Medical Readiness: 01/14/2024          B Medical/Surgery History Past Medical History:  Diagnosis Date   Aortic atherosclerosis (HCC)    Asthma    Back pain    Closed fracture of left distal radius and ulna 12/03/2019   Last Assessment & Plan:   Formatting of this note might be different from the original.  Treatment:  1.  I agreed to let patient stay home for 2 weeks and do her home exercises only however I wish to see the patient in 2 weeks to verify she is continue to improve her finger range of motion and wrist range of motion.  If she is not showing significant improvement at that time we will send her back    Complex regional pain syndrome type 1 of left upper extremity 03/14/2020   COPD (chronic obstructive pulmonary disease) (HCC)     Former cigarette smoker 07/07/2017   GERD (gastroesophageal reflux disease)    Hyperlipidemia 02/15/2021   Osteoporosis    Shortness of breath    with exertion   Vitamin D  deficiency    Past Surgical History:  Procedure Laterality Date   BREAST BIOPSY Left    benign   BREAST ENHANCEMENT SURGERY  1996   bilateral-[place]   BREAST EXCISIONAL BIOPSY Left    benign   BREAST LUMPECTOMY  08/30/2011   Procedure: LUMPECTOMY;  Surgeon: Lovena Rubinstein;  Location: [facility];  Service: General;  Laterality: Left;   KYPHOPLASTY  2018   TUBAL LIGATION  1985   [place]     A IV Location/Drains/Wounds Patient Lines/Drains/Airways Status     Active Line/Drains/Airways     Name Placement date Placement time Site Days   Peripheral IV 01/11/24 20 G Anterior;Proximal;Right Forearm 01/11/24  1844  Forearm  less than 1            Intake/Output Last 24 hours No intake or output data in the 24 hours ending 01/11/24 2258  Labs/Imaging Results for orders placed or performed during the hospital encounter of 01/11/24 (from the past 48 hours)  Lactic acid, plasma     Status: None   Collection Time: 01/11/24  6:36 PM  Result Value Ref Range   Lactic Acid, Venous 1.1 0.5 - 1.9 mmol/L  Comment: Performed at [facility], [address], [place], [place] [address]  Comprehensive metabolic panel     Status: Abnormal   Collection Time: 01/11/24  6:36 PM  Result Value Ref Range   Sodium 137 135 - 145 mmol/L   Potassium 3.5 3.5 - 5.1 mmol/L   Chloride 100 98 - 111 mmol/L   CO2 26 22 - 32 mmol/L   Glucose, Bld 175 (H) 70 - 99 mg/dL    Comment: Glucose reference range applies only to samples taken after fasting for at least 8 hours.   BUN 14 8 - 23 mg/dL   Creatinine, Ser 5.63 0.44 - 1.00 mg/dL   Calcium  8.5 (L) 8.9 - 10.3 mg/dL   Total Protein 7.3 6.5 - 8.1 g/dL   Albumin 3.9 3.5 - 5.0 g/dL   AST 21 15 - 41 U/L   ALT 21 0 - 44 U/L   Alkaline Phosphatase 92 38 - 126 U/L   Total  Bilirubin 0.6 0.0 - 1.2 mg/dL   GFR, Estimated >87 >56 mL/min    Comment: (NOTE) Calculated using the CKD-EPI Creatinine Equation (2021)    Anion gap 11 5 - 15    Comment: Performed at [facility], [address] [place], [place] [address]  CBC with Differential     Status: Abnormal   Collection Time: 01/11/24  6:36 PM  Result Value Ref Range   WBC 7.2 4.0 - 10.5 K/uL   RBC 4.85 3.87 - 5.11 MIL/uL   Hemoglobin 13.9 12.0 - 15.0 g/dL   HCT 51.8 84.1 - 66.0 %   MCV 88.0 80.0 - 100.0 fL   MCH 28.7 26.0 - 34.0 pg   MCHC 32.6 30.0 - 36.0 g/dL   RDW 63.0 16.0 - 10.9 %   Platelets 149 (L) 150 - 400 K/uL   nRBC 0.0 0.0 - 0.2 %   Neutrophils Relative % 92 %   Neutro Abs 6.7 1.7 - 7.7 K/uL   Lymphocytes Relative 4 %   Lymphs Abs 0.3 (L) 0.7 - 4.0 K/uL   Monocytes Relative 4 %   Monocytes Absolute 0.3 0.1 - 1.0 K/uL   Eosinophils Relative 0 %   Eosinophils Absolute 0.0 0.0 - 0.5 K/uL   Basophils Relative 0 %   Basophils Absolute 0.0 0.0 - 0.1 K/uL   Immature Granulocytes 0 %   Abs Immature Granulocytes 0.03 0.00 - 0.07 K/uL    Comment: Performed at [facility], [address] [place], [place] [address]  Blood culture (routine x 2)     Status: None (Preliminary result)   Collection Time: 01/11/24  6:36 PM   Specimen: Right Antecubital; Blood  Result Value Ref Range   Specimen Description RIGHT ANTECUBITAL    Special Requests      BOTTLES DRAWN AEROBIC AND ANAEROBIC Blood Culture adequate volume Performed at [facility], [address] [place], [place] [address]    Culture PENDING    Report Status PENDING   Troponin I (High Sensitivity)     Status: None   Collection Time: 01/11/24  6:36 PM  Result Value Ref Range   Troponin I (High Sensitivity) 7 <18 ng/L    Comment: (NOTE) Elevated high sensitivity troponin I (hsTnI) values and significant  changes across serial measurements may suggest ACS but many other  chronic and acute conditions are known to elevate hsTnI results.   Refer to the "Links" section for chest pain algorithms and additional  guidance. Performed at [facility], [phone]  [address] [place], [place] [address]   Blood culture (routine x 2)     Status: None (Preliminary result)   Collection Time: 01/11/24  6:46 PM   Specimen: Left Antecubital; Blood  Result Value Ref Range   Specimen Description LEFT ANTECUBITAL    Special Requests      BOTTLES DRAWN AEROBIC AND ANAEROBIC Blood Culture adequate volume Performed at [facility], [address], [place], [place] [address]    Culture PENDING    Report Status PENDING   Urinalysis, w/ Reflex to Culture (Infection Suspected) -Urine, Clean Catch     Status: Abnormal   Collection Time: 01/11/24  8:08 PM  Result Value Ref Range   Specimen Source URINE, CLEAN CATCH    Color, Urine YELLOW YELLOW   APPearance CLEAR CLEAR   Specific Gravity, Urine 1.025 1.005 - 1.030   pH 5.0 5.0 - 8.0   Glucose, UA 150 (A) NEGATIVE mg/dL   Hgb urine dipstick SMALL (A) NEGATIVE   Bilirubin Urine NEGATIVE NEGATIVE   Ketones, ur 20 (A) NEGATIVE mg/dL   Protein, ur 30 (A) NEGATIVE mg/dL   Nitrite NEGATIVE NEGATIVE   Leukocytes,Ua NEGATIVE NEGATIVE   RBC / HPF 0-5 0 - 5 RBC/hpf   WBC, UA 0-5 0 - 5 WBC/hpf    Comment:        Reflex urine culture not performed if WBC <=10, OR if Squamous epithelial cells >5. If Squamous epithelial cells >5 suggest recollection.    Bacteria, UA NONE SEEN NONE SEEN   Squamous Epithelial / HPF 0-5 0 - 5 /HPF   Mucus PRESENT     Comment: Performed at [facility], [address] [place], [place] [address]  Resp panel by RT-PCR (RSV, Flu A&B, Covid) Anterior Nasal Swab     Status: Abnormal   Collection Time: 01/11/24  8:11 PM   Specimen: Anterior Nasal Swab  Result Value Ref Range   SARS Coronavirus 2 by RT PCR NEGATIVE NEGATIVE    Comment: (NOTE) SARS-CoV-2 target nucleic acids are NOT DETECTED.  The SARS-CoV-2 RNA is generally detectable in upper respiratory specimens during the  acute phase of infection. The lowest concentration of SARS-CoV-2 viral copies this assay can detect is 138 copies/mL. A negative result does not preclude SARS-Cov-2 infection and should not be used as the sole basis for treatment or other patient management decisions. A negative result may occur with  improper specimen collection/handling, submission of specimen other than nasopharyngeal swab, presence of viral mutation(s) within the areas targeted by this assay, and inadequate number of viral copies(<138 copies/mL). A negative result must be combined with clinical observations, patient history, and epidemiological information. The expected result is Negative.  Fact Sheet for Patients:  BloggerCourse.com  Fact Sheet for Healthcare Providers:  SeriousBroker.it  This test is no t yet approved or cleared by the [place]  FDA and  has been authorized for detection and/or diagnosis of SARS-CoV-2 by FDA under an Emergency Use Authorization (EUA). This EUA will remain  in effect (meaning this test can be used) for the duration of the COVID-19 declaration under Section 564(b)(1) of the Act, 21 U.S.C.section 360bbb-3(b)(1), unless the authorization is terminated  or revoked sooner.       Influenza A by PCR NEGATIVE NEGATIVE   Influenza B by PCR POSITIVE (A) NEGATIVE    Comment: (NOTE) The Xpert Xpress SARS-CoV-2/FLU/RSV plus assay is intended as an aid in the diagnosis of influenza from Nasopharyngeal swab specimens and should not be used as a  sole basis for treatment. Nasal washings and aspirates are unacceptable for Xpert Xpress SARS-CoV-2/FLU/RSV testing.  Fact Sheet for Patients: BloggerCourse.com  Fact Sheet for Healthcare Providers: SeriousBroker.it  This test is not yet approved or cleared by the [place]  FDA and has been authorized for detection and/or diagnosis of  SARS-CoV-2 by FDA under an Emergency Use Authorization (EUA). This EUA will remain in effect (meaning this test can be used) for the duration of the COVID-19 declaration under Section 564(b)(1) of the Act, 21 U.S.C. section 360bbb-3(b)(1), unless the authorization is terminated or revoked.     Resp Syncytial Virus by PCR NEGATIVE NEGATIVE    Comment: (NOTE) Fact Sheet for Patients: BloggerCourse.com  Fact Sheet for Healthcare Providers: SeriousBroker.it  This test is not yet approved or cleared by the [place]  FDA and has been authorized for detection and/or diagnosis of SARS-CoV-2 by FDA under an Emergency Use Authorization (EUA). This EUA will remain in effect (meaning this test can be used) for the duration of the COVID-19 declaration under Section 564(b)(1) of the Act, 21 U.S.C. section 360bbb-3(b)(1), unless the authorization is terminated or revoked.  Performed at [facility], [address], [place], [place] [address]    DG Chest 2 View Result Date: 01/11/2024 CLINICAL DATA:  Shortness of breath. EXAM: CHEST - 2 VIEW COMPARISON:  09/06/2023 FINDINGS: The cardiac silhouette, mediastinal and hilar contours are within normal limits and stable. Underlying emphysematous changes and pulmonary scarring. There are bilateral pulmonary infiltrates notably in the right middle lobe and left lower lobe. No pleural effusions. No pneumothorax. The bony thorax is intact. Remote vertebral augmentation changes noted at T8. IMPRESSION: 1. Bilateral pulmonary infiltrates. 2. Underlying emphysematous changes and pulmonary scarring. Electronically Signed   By: Marrian Siva M.D.   On: 01/11/2024 20:16    Pending Labs Unresulted Labs (From admission, onward)     Start     Ordered   01/12/24 0500  HIV Antibody (routine testing w rflx)  (HIV Antibody (Routine testing w reflex) panel)  Tomorrow morning,   R        01/11/24 2252   01/12/24 0500   Basic metabolic panel with GFR  Tomorrow morning,   R        01/11/24 2252   01/12/24 0500  CBC  Tomorrow morning,   R        01/11/24 2252   01/12/24 0500  Magnesium  Tomorrow morning,   R        01/11/24 2252   01/12/24 0500  Phosphorus  Tomorrow morning,   R        01/11/24 2252   01/11/24 2251  Expectorated Sputum Assessment w Gram Stain, Rflx to Resp Cult  Once,   R        01/11/24 2252   01/11/24 2251  MRSA Next Gen by PCR, Nasal  Once,   R        01/11/24 2252            Vitals/Pain Today's Vitals   01/11/24 1758 01/11/24 1943 01/11/24 2100 01/11/24 2115  BP: 129/73  104/60 (!) 101/56  Pulse: (!) 136  (!) 109 (!) 104  Resp: (!) 28 20    Temp:    98.8 F (37.1 C)  TempSrc:      SpO2: 90%  95% 94%  Weight:      Height:      PainSc:        Isolation Precautions No active isolations  Medications  Medications  enoxaparin  (LOVENOX ) injection 40 mg (has no administration in time range)  sodium chloride  flush (NS) 0.9 % injection 3 mL (has no administration in time range)  cefTRIAXone (ROCEPHIN) 1 g in sodium chloride  0.9 % 100 mL IVPB (has no administration in time range)  azithromycin  (ZITHROMAX ) tablet 500 mg (has no administration in time range)  ipratropium-albuterol  (DUONEB) 0.5-2.5 (3) MG/3ML nebulizer solution 3 mL (has no administration in time range)  albuterol  (PROVENTIL ) (2.5 MG/3ML) 0.083% nebulizer solution 2.5 mg (has no administration in time range)  predniSONE  (DELTASONE ) tablet 40 mg (has no administration in time range)  oseltamivir (TAMIFLU) capsule 75 mg (has no administration in time range)  acetaminophen  (TYLENOL ) tablet 1,000 mg (has no administration in time range)  melatonin tablet 6 mg (has no administration in time range)  ondansetron  (ZOFRAN ) injection 4 mg (has no administration in time range)  polyethylene glycol (MIRALAX / GLYCOLAX) packet 17 g (has no administration in time range)  ipratropium-albuterol  (DUONEB) 0.5-2.5 (3) MG/3ML  nebulizer solution 3 mL (3 mLs Nebulization Given 01/11/24 1838)  methylPREDNISolone sodium succinate (SOLU-MEDROL) 125 mg/2 mL injection 125 mg (125 mg Intravenous Given 01/11/24 1844)  cefTRIAXone (ROCEPHIN) 1 g in sodium chloride  0.9 % 100 mL IVPB (0 g Intravenous Stopped 01/11/24 2258)  azithromycin  (ZITHROMAX ) 500 mg in sodium chloride  0.9 % 250 mL IVPB (0 mg Intravenous Stopped 01/11/24 2258)  sodium chloride  0.9 % bolus 500 mL (0 mLs Intravenous Stopped 01/11/24 2258)  ipratropium-albuterol  (DUONEB) 0.5-2.5 (3) MG/3ML nebulizer solution (3 mLs  Given 01/11/24 2114)  albuterol  (PROVENTIL ) (2.5 MG/3ML) 0.083% nebulizer solution (2.5 mg  Given 01/11/24 2114)    Mobility walks     Focused Assessments Pulmonary Assessment Handoff:  Lung sounds: Bilateral Breath Sounds: Expiratory wheezes, Coarse crackles O2 Device: Nasal Cannula O2 Flow Rate (L/min): 4 L/min    R Recommendations: See Admitting Provider Note  Report given to:   Additional Notes:

## 2024-01-11 NOTE — ED Triage Notes (Signed)
 Pt with SOB today, pt seen at [facility] and was given prescriptions for prednisone  and antibiotic. Pt with home O2, normally at 2 L/M, pt has on 5 L/M.  Pt thinks her throat has swelling. Pt c/o right rib pain, pt believes it may be broken.

## 2024-01-11 NOTE — ED Notes (Addendum)
 Pt in xray, they will bring her to the room when finished. Pt was in VT. Family at bedside.

## 2024-01-12 DIAGNOSIS — J441 Chronic obstructive pulmonary disease with (acute) exacerbation: Secondary | ICD-10-CM | POA: Diagnosis not present

## 2024-01-12 DIAGNOSIS — J9621 Acute and chronic respiratory failure with hypoxia: Secondary | ICD-10-CM | POA: Diagnosis not present

## 2024-01-12 DIAGNOSIS — J101 Influenza due to other identified influenza virus with other respiratory manifestations: Secondary | ICD-10-CM

## 2024-01-12 LAB — BASIC METABOLIC PANEL WITH GFR
Anion gap: 13 (ref 5–15)
BUN: 12 mg/dL (ref 8–23)
CO2: 25 mmol/L (ref 22–32)
Calcium: 7.7 mg/dL — ABNORMAL LOW (ref 8.9–10.3)
Chloride: 98 mmol/L (ref 98–111)
Creatinine, Ser: 0.76 mg/dL (ref 0.44–1.00)
GFR, Estimated: >60 mL/min (ref >60)
Glucose, Bld: 232 mg/dL — ABNORMAL HIGH (ref 70–99)
Potassium: 2.9 mmol/L — ABNORMAL LOW (ref 3.5–5.1)
Sodium: 135 mmol/L (ref 135–145)

## 2024-01-12 LAB — CBC
HCT: 36.1 % (ref 36.0–46.0)
Hemoglobin: 11.9 g/dL — ABNORMAL LOW (ref 12.0–15.0)
MCH: 29.2 pg (ref 26.0–34.0)
MCHC: 33 g/dL (ref 30.0–36.0)
MCV: 88.5 fL (ref 80.0–100.0)
Platelets: 152 10*3/uL (ref 150–400)
RBC: 4.08 MIL/uL (ref 3.87–5.11)
RDW: 13.2 % (ref 11.5–15.5)
WBC: 10.9 10*3/uL — ABNORMAL HIGH (ref 4.0–10.5)
nRBC: 0 % (ref 0.0–0.2)

## 2024-01-12 LAB — GLUCOSE, CAPILLARY
Glucose-Capillary: 158 mg/dL — ABNORMAL HIGH (ref 70–99)
Glucose-Capillary: 157 mg/dL — ABNORMAL HIGH (ref 70–99)
Glucose-Capillary: 125 mg/dL — ABNORMAL HIGH (ref 70–99)

## 2024-01-12 LAB — HIV ANTIBODY (ROUTINE TESTING W REFLEX): HIV Screen 4th Generation wRfx: NONREACTIVE

## 2024-01-12 LAB — MRSA NEXT GEN BY PCR, NASAL: MRSA by PCR Next Gen: NOT DETECTED

## 2024-01-12 LAB — MAGNESIUM: Magnesium: 1.6 mg/dL — ABNORMAL LOW (ref 1.7–2.4)

## 2024-01-12 LAB — PHOSPHORUS: Phosphorus: 3.3 mg/dL (ref 2.5–4.6)

## 2024-01-12 MED ORDER — BUDESON-GLYCOPYRROL-FORMOTEROL 160-9-4.8 MCG/ACT IN AERO
2.0000 | INHALATION_SPRAY | Freq: Two times a day (BID) | RESPIRATORY_TRACT | Status: DC
  Administered 2024-01-12 – 2024-01-13 (×3): 2 via RESPIRATORY_TRACT
  Filled 2024-01-12: qty 5.9

## 2024-01-12 MED ORDER — INSULIN ASPART 100 UNIT/ML IJ SOLN: 0.0000 [IU] | Freq: Every day | INTRAMUSCULAR | Status: DC

## 2024-01-12 MED ORDER — ASPIRIN 81 MG PO CHEW
81.0000 mg | CHEWABLE_TABLET | Freq: Every day | ORAL | Status: DC
  Administered 2024-01-12 – 2024-01-13 (×2): 81 mg via ORAL
  Filled 2024-01-12 (×2): qty 1

## 2024-01-12 MED ORDER — ATORVASTATIN CALCIUM 10 MG PO TABS
10.0000 mg | ORAL_TABLET | Freq: Every day | ORAL | Status: DC
  Administered 2024-01-12: 10 mg via ORAL
  Filled 2024-01-12: qty 1

## 2024-01-12 MED ORDER — FUROSEMIDE 10 MG/ML IJ SOLN
20.0000 mg | Freq: Once | INTRAMUSCULAR | Status: AC
  Administered 2024-01-12: 20 mg via INTRAVENOUS
  Filled 2024-01-12: qty 2

## 2024-01-12 MED ORDER — MAGNESIUM SULFATE 2 GM/50ML IV SOLN
2.0000 g | Freq: Once | INTRAVENOUS | Status: AC
  Administered 2024-01-12: 2 g via INTRAVENOUS
  Filled 2024-01-12: qty 50

## 2024-01-12 MED ORDER — IPRATROPIUM-ALBUTEROL 0.5-2.5 (3) MG/3ML IN SOLN
3.0000 mL | Freq: Three times a day (TID) | RESPIRATORY_TRACT | Status: DC
  Administered 2024-01-13: 3 mL via RESPIRATORY_TRACT
  Filled 2024-01-12: qty 3

## 2024-01-12 MED ORDER — OXYCODONE-ACETAMINOPHEN 5-325 MG PO TABS: 1.0000 | ORAL_TABLET | Freq: Four times a day (QID) | ORAL | Status: DC | PRN

## 2024-01-12 MED ORDER — INSULIN ASPART 100 UNIT/ML IJ SOLN
0.0000 [IU] | Freq: Three times a day (TID) | INTRAMUSCULAR | Status: DC
  Administered 2024-01-12 (×2): 2 [IU] via SUBCUTANEOUS

## 2024-01-12 MED ORDER — POTASSIUM CHLORIDE CRYS ER 20 MEQ PO TBCR
40.0000 meq | EXTENDED_RELEASE_TABLET | ORAL | Status: AC
  Administered 2024-01-12 (×2): 40 meq via ORAL
  Filled 2024-01-12 (×2): qty 2

## 2024-01-12 NOTE — Progress Notes (Signed)
 No charge note  Patient seen and examined this morning, admitted overnight, H&P reviewed and agree with the assessment and plan.  In brief, this 63 year old female with history of COPD, chronic hypoxia with 2 L at home, CAD, prediabetes, hyperlipidemia who comes into the hospital with shortness of breath.  Her son was sick with a URI right around [place] time, he recovered, however over the last several days she has been experiencing flulike illness, sore throat, myalgias, chills and worsening shortness of breath.  She checked her oxygen  saturation at home, was found to be in the 80s and had to increase her home oxygen  from 2 to 5 L.  Given persistent symptoms she decided to present to the emergency room.  She was found to be wheezing, also positive for influenza B, imaging showed bilateral pulmonary infiltrates, and she was admitted to the hospital   She is doing much better this morning, feeling improved.  Shortness of breath has improved significantly  Principal problem COPD exacerbation, influenza B pneumonia, acute on chronic hypoxic respiratory failure -continue supportive care with supplemental oxygen , she was started on Tamiflu, continue.  Continue prednisone .  She was started on empiric antibiotics with ceftriaxone azithromycin  given underlying lung disease and increased risk for superimposed bacterial infection, continue for total of 5 days  Active problems CAD-no chest pain, continue aspirin, statin  Hyperlipidemia-continue statin  Hypokalemia-replenish potassium and continue to monitor.  Check magnesium  Hypomagnesemia-replenish potassium and continue to monitor  History of prediabetes, steroid-induced hyperglycemia -CBGs this morning was 232, add sliding scale as this is likely due to steroids  Lab Results  Component Value Date   HGBA1C 5.9 (H) 07/18/2023    Scheduled Meds:  aspirin  81 mg Oral Daily   atorvastatin   10 mg Oral QHS   azithromycin   500 mg Oral QHS    budeson-glycopyrrolate -formoterol  2 puff Inhalation BID   enoxaparin  (LOVENOX ) injection  40 mg Subcutaneous Q24H   ipratropium-albuterol   3 mL Nebulization Q6H   oseltamivir  75 mg Oral BID   potassium chloride  40 mEq Oral Q3H   predniSONE   40 mg Oral Q breakfast   sodium chloride  flush  3 mL Intravenous Q12H   Continuous Infusions:  cefTRIAXone (ROCEPHIN)  IV     PRN Meds:.albuterol , melatonin, ondansetron  (ZOFRAN ) IV, oxyCODONE-acetaminophen , polyethylene glycol  Sixto Bowdish M. Aldona Amel, MD, PhD [facility]  Between 7 am - 7 pm you can contact me via Amion (for emergencies) or Securechat (non urgent matters).  I am not available 7 pm - 7 am, please contact night coverage MD/APP via Amion

## 2024-01-12 NOTE — H&P (Signed)
 History and Physical    Abigail Gill:[id number] DOB: 06-02-1961 DOA: 01/11/2024  PCP: Abigail Sport, MD   Patient coming from: Home   Chief Complaint:  Chief Complaint  Patient presents with   Shortness of Breath    HPI:  Abigail Gill is a 63 y.o. female with hx of COPD, CHRF on 2 L O2, CAD, pre-DM, HLD, who presents with worsening shortness of breath.  Reports she has sick contact with her son who has a flulike illness.  Over the past 4 days she developed cough productive with white sputum, sore throat, myalgias, chills.  Has had progressive worsening shortness of breath to the point where she is short of breath even at rest.  Normally wears 2 L of O2 as needed.  Checks O2 sats and had a sat 88% on her normal 2 L, had increased to 5 L at home.  Has been using home inhalers as prescribed.   Review of Systems:  ROS complete and negative except as marked above   Allergies  Allergen Reactions   Pregabalin Other (See Comments)    Headaches    Prior to Admission medications   Medication Sig Start Date End Date Taking? Authorizing Provider  albuterol  (VENTOLIN  HFA) 108 (90 Base) MCG/ACT inhaler Inhale 2 puffs into the lungs every 4 (four) hours as needed. For shortness of breath 01/09/23   Abigail Sport, MD  alendronate  (FOSAMAX ) 70 MG tablet Take 1 tablet (70 mg total) by mouth every 7 (seven) days. Take with a full glass of water on an empty stomach. 07/18/23   Gill, Abigail K, MD  aspirin 81 MG chewable tablet Chew 1 tablet by mouth daily.    [provider]  atorvastatin  (LIPITOR) 10 MG tablet Take 1 tablet (10 mg total) by mouth at bedtime. 08/08/23   Abigail Pollock, MD  Budeson-Glycopyrrol-Formoterol (BREZTRI  AEROSPHERE) 160-9-4.8 MCG/ACT AERO Inhale 2 puffs into the lungs 2 (two) times daily. 01/09/23   Abigail Sport, MD  cetirizine (ZYRTEC) 10 MG tablet Take 10 mg by mouth as needed.    [provider]  cholecalciferol (VITAMIN D3) 25 MCG (1000  UT) tablet Take 5,000 Units by mouth daily.    [provider]  meloxicam  (MOBIC ) 15 MG tablet Take 1 tablet (15 mg total) by mouth daily. Patient not taking: Reported on 09/23/2023 01/09/23   Abigail Sport, MD  naloxone [facility]) nasal spray 4 mg/0.1 mL Place 1 spray into the nose. 07/29/23   [provider]  ondansetron  (ZOFRAN ) 4 MG tablet Take 1 tablet (4 mg total) by mouth every 8 (eight) hours as needed for nausea or vomiting. Patient not taking: Reported on 09/23/2023 09/07/21   Abigail Sport, MD  oxyCODONE-acetaminophen  (PERCOCET/ROXICET) 5-325 MG tablet Take 1 tablet by mouth every 6 (six) hours as needed.    [provider]    Past Medical History:  Diagnosis Date   Aortic atherosclerosis (HCC)    Asthma    Back pain    Closed fracture of left distal radius and ulna 12/03/2019   Last Assessment & Plan:   Formatting of this note might be different from the original.  Treatment:  1.  I agreed to let patient stay home for 2 weeks and do her home exercises only however I wish to see the patient in 2 weeks to verify she is continue to improve her finger range of motion and wrist range of motion.  If she is not showing  significant improvement at that time we will send her back    Complex regional pain syndrome type 1 of left upper extremity 03/14/2020   COPD (chronic obstructive pulmonary disease) (HCC)    Former cigarette smoker 07/07/2017   GERD (gastroesophageal reflux disease)    Hyperlipidemia 02/15/2021   Osteoporosis    Shortness of breath    with exertion   Vitamin D  deficiency     Past Surgical History:  Procedure Laterality Date   BREAST BIOPSY Left    benign   BREAST ENHANCEMENT SURGERY  1996   bilateral-[place]   BREAST EXCISIONAL BIOPSY Left    benign   BREAST LUMPECTOMY  08/30/2011   Procedure: LUMPECTOMY;  Surgeon: Abigail Gill;  Location: [facility];  Service: General;  Laterality: Left;   KYPHOPLASTY  2018   TUBAL LIGATION  1985    Abigail Gill     reports that she quit smoking about 14 years ago. Her smoking use included cigarettes. She started smoking about 47 years ago. She has a 32.6 pack-year smoking history. She has never been exposed to tobacco smoke. She has never used smokeless tobacco. She reports that she does not drink alcohol and does not use drugs.  Family History  Problem Relation Age of Onset   Alcohol abuse Mother    Arthritis Mother    COPD Mother    Hyperlipidemia Mother    Pancreatitis Mother    Heart failure Father    Alcohol abuse Father    Arthritis Father    Diabetes Father    Heart disease Father        several heart attacks   Hyperlipidemia Father    Hypertension Father    Kidney disease Father    Stroke Father        several strokes   COPD Sister    Hypertension Sister    Anesthesia problems Neg Hx    Hypotension Neg Hx    Malignant hyperthermia Neg Hx    Pseudochol deficiency Neg Hx      Physical Exam: Vitals:   01/12/24 0001 01/12/24 0016 01/12/24 0206 01/12/24 0414  BP: 122/68   104/63  Pulse: 100   90  Resp:    18  Temp: 98.3 F (36.8 C)   98.6 F (37 C)  TempSrc: Oral   Oral  SpO2: 98%  95% 97%  Weight:  71.7 kg    Height:  5\' 2"  (1.575 m)      Gen: Awake, alert, NAD   CV: Regular, normal S1, S2, no murmurs  Resp: Slight increased work of breathing, on [place], diffuse expiratory wheezing, rales in the bases. Abd: Flat, normoactive, nontender MSK: Symmetric, 1+ edema near the ankles Skin: No rashes or lesions to exposed skin  Neuro: Alert and interactive  Psych: euthymic, appropriate    Data review:   Labs reviewed, notable for:   Lactate 1.1 High-sensitivity troponin negative, WBC 7  Micro:  Results for orders placed or performed during the hospital encounter of 01/11/24  Blood culture (routine x 2)     Status: None (Preliminary result)   Collection Time: 01/11/24  6:36 PM   Specimen: Vein; Blood  Result Value Ref Range Status   Specimen  Description RIGHT ANTECUBITAL  Final   Special Requests   Final    BOTTLES DRAWN AEROBIC AND ANAEROBIC Blood Culture adequate volume Performed at [facility], [address], [place], [place] [address]    Culture PENDING  Incomplete   Report Status  PENDING  Incomplete  Blood culture (routine x 2)     Status: None (Preliminary result)   Collection Time: 01/11/24  6:46 PM   Specimen: Left Antecubital; Blood  Result Value Ref Range Status   Specimen Description LEFT ANTECUBITAL  Final   Special Requests   Final    BOTTLES DRAWN AEROBIC AND ANAEROBIC Blood Culture adequate volume Performed at [facility], [address], [place], [place] [address]    Culture PENDING  Incomplete   Report Status PENDING  Incomplete  Resp panel by RT-PCR (RSV, Flu A&B, Covid) Anterior Nasal Swab     Status: Abnormal   Collection Time: 01/11/24  8:11 PM   Specimen: Anterior Nasal Swab  Result Value Ref Range Status   SARS Coronavirus 2 by RT PCR NEGATIVE NEGATIVE Final    Comment: (NOTE) SARS-CoV-2 target nucleic acids are NOT DETECTED.  The SARS-CoV-2 RNA is generally detectable in upper respiratory specimens during the acute phase of infection. The lowest concentration of SARS-CoV-2 viral copies this assay can detect is 138 copies/mL. A negative result does not preclude SARS-Cov-2 infection and should not be used as the sole basis for treatment or other patient management decisions. A negative result may occur with  improper specimen collection/handling, submission of specimen other than nasopharyngeal swab, presence of viral mutation(s) within the areas targeted by this assay, and inadequate number of viral copies(<138 copies/mL). A negative result must be combined with clinical observations, patient history, and epidemiological information. The expected result is Negative.  Fact Sheet for Patients:  BloggerCourse.com  Fact Sheet for Healthcare Providers:   SeriousBroker.it  This test is no t yet approved or cleared by the [place]  FDA and  has been authorized for detection and/or diagnosis of SARS-CoV-2 by FDA under an Emergency Use Authorization (EUA). This EUA will remain  in effect (meaning this test can be used) for the duration of the COVID-19 declaration under Section 564(b)(1) of the Act, 21 U.S.C.section 360bbb-3(b)(1), unless the authorization is terminated  or revoked sooner.       Influenza A by PCR NEGATIVE NEGATIVE Final   Influenza B by PCR POSITIVE (A) NEGATIVE Final    Comment: (NOTE) The Xpert Xpress SARS-CoV-2/FLU/RSV plus assay is intended as an aid in the diagnosis of influenza from Nasopharyngeal swab specimens and should not be used as a sole basis for treatment. Nasal washings and aspirates are unacceptable for Xpert Xpress SARS-CoV-2/FLU/RSV testing.  Fact Sheet for Patients: BloggerCourse.com  Fact Sheet for Healthcare Providers: SeriousBroker.it  This test is not yet approved or cleared by the [place]  FDA and has been authorized for detection and/or diagnosis of SARS-CoV-2 by FDA under an Emergency Use Authorization (EUA). This EUA will remain in effect (meaning this test can be used) for the duration of the COVID-19 declaration under Section 564(b)(1) of the Act, 21 U.S.C. section 360bbb-3(b)(1), unless the authorization is terminated or revoked.     Resp Syncytial Virus by PCR NEGATIVE NEGATIVE Final    Comment: (NOTE) Fact Sheet for Patients: BloggerCourse.com  Fact Sheet for Healthcare Providers: SeriousBroker.it  This test is not yet approved or cleared by the [place]  FDA and has been authorized for detection and/or diagnosis of SARS-CoV-2 by FDA under an Emergency Use Authorization (EUA). This EUA will remain in effect (meaning this test can be used)  for the duration of the COVID-19 declaration under Section 564(b)(1) of the Act, 21 U.S.C. section 360bbb-3(b)(1), unless the authorization is terminated or revoked.  Performed at [facility], [address], [place], [place] [address]   MRSA Next Gen by PCR, Nasal     Status: None   Collection Time: 01/12/24 12:50 AM   Specimen: Nasal Mucosa; Nasal Swab  Result Value Ref Range Status   MRSA by PCR Next Gen NOT DETECTED NOT DETECTED Final    Comment: (NOTE) The GeneXpert MRSA Assay (FDA approved for NASAL specimens only), is one component of a comprehensive MRSA colonization surveillance program. It is not intended to diagnose MRSA infection nor to guide or monitor treatment for MRSA infections. Test performance is not FDA approved in patients less than 9 years old. Performed at [facility], [address], [place], [place] [address]     Imaging reviewed:  DG Chest 2 View Result Date: 01/11/2024 CLINICAL DATA:  Shortness of breath. EXAM: CHEST - 2 VIEW COMPARISON:  09/06/2023 FINDINGS: The cardiac silhouette, mediastinal and hilar contours are within normal limits and stable. Underlying emphysematous changes and pulmonary scarring. There are bilateral pulmonary infiltrates notably in the right middle lobe and left lower lobe. No pleural effusions. No pneumothorax. The bony thorax is intact. Remote vertebral augmentation changes noted at T8. IMPRESSION: 1. Bilateral pulmonary infiltrates. 2. Underlying emphysematous changes and pulmonary scarring. Electronically Signed   By: Marrian Siva M.D.   On: 01/11/2024 20:16   Personally reviewed chest x-ray: Agree with radiology findings above.   ED Course:  Treated with ceftriaxone, azithromycin , 500 cc IV fluid, methylprednisolone, nebs.   Assessment/Plan:  63 y.o. female with hx COPD, CHRF on 2 L O2, CAD, pre-DM, HLD, who presents with worsening shortness of breath.  Admitted with influenza B pneumonia with COPD exacerbation and acute  on chronic hypoxic respiratory failure.  Influenza B pneumonia COPD exacerbation Acute on chronic hypoxic respiratory failure 2 -> 4L O2  4 days of LRI symptoms and progressive shortness of breath, + sick contact with son.  Desatted at home on 2L (uses prn) requiring increase in her O2.  Currently requiring 4 L O2.  Flu B+.  Chest x-ray demonstrating bilateral opacities worse in the RML and LLL.  Question slight component of hypervolemia also contributing.  -Continue ceftriaxone 1 g IV every 24 hours, azithromycin  500 mg daily - S/p methylprednisolone, switch to prednisone  40 mg daily to complete 5-day course - Started on Tamiflu 75 mg twice daily for 5 days - Single dose of Lasix 20 mg IV x 1 for possible hypervolemia.  Check BNP - Check sputum culture - Continue home Breztri , DuoNebs every 6 hours scheduled, albuterol  every 4 hours as needed, incentive spirometer, flutter valve, encourage out of bed to chair. - Home O2 evaluation prior to discharge - Droplet precautions  Chronic medical problems: CAD: Continue home aspirin, atorvastatin  Prediabetes: Not currently requiring SSI.  Continue to monitor. HLD: Continue statin per above Chronic pain: Continue home Percocet prn  Body mass index is 28.92 kg/m.    DVT prophylaxis:  Lovenox  Code Status:  Full Code Diet:  Diet Orders (From admission, onward)     Start     Ordered   01/11/24 2250  Diet Heart Room service appropriate? Yes; Fluid consistency: Thin  Diet effective now       Question Answer Comment  Room service appropriate? Yes   Fluid consistency: Thin      01/11/24 2252           Family Communication: Yes discussed with son at the bedside Consults:  None   Admission status:   Inpatient, Telemetry bed  Severity of Illness: The appropriate patient status for this patient is INPATIENT. Inpatient status is judged to be reasonable and necessary in order to provide the required intensity of service to ensure the  patient's safety. The patient's presenting symptoms, physical exam findings, and initial radiographic and laboratory data in the context of their chronic comorbidities is felt to place them at high risk for further clinical deterioration. Furthermore, it is not anticipated that the patient will be medically stable for discharge from the hospital within 2 midnights of admission.   * I certify that at the point of admission it is my clinical judgment that the patient will require inpatient hospital care spanning beyond 2 midnights from the point of admission due to high intensity of service, high risk for further deterioration and high frequency of surveillance required.*   Arnulfo Larch, MD [facility]  How to contact the [facility] Attending or Consulting provider 7A - 7P or covering provider during after hours 7P -7A, for this patient.  Check the care team in [facility] and look for a) attending/consulting [facility] provider listed and b) the [facility] team listed Log into [email or web address] and use [place]'s universal password to access. If you do not have the password, please contact the hospital operator. Locate the [facility] provider you are looking for under [facility] and page to a number that you can be directly reached. If you still have difficulty reaching the provider, please page the [facility] (Director on Call) for the Hospitalists listed on amion for assistance.  01/12/2024, 4:21 AM

## 2024-01-12 NOTE — Plan of Care (Signed)

## 2024-01-12 NOTE — Plan of Care (Signed)

## 2024-01-12 NOTE — Progress Notes (Signed)
 Mobility Specialist Progress Note:    01/12/24 1555  Mobility  Activity Ambulated with assistance in room  Level of Assistance Modified independent, requires aide device or extra time  Assistive Device None  Distance Ambulated (ft) 70 ft  Range of Motion/Exercises Active;All extremities  Activity Response Tolerated well  Mobility Referral Yes  Mobility visit 1 Mobility  Mobility Specialist Start Time (ACUTE ONLY) 1530  Mobility Specialist Stop Time (ACUTE ONLY) 1550  Mobility Specialist Time Calculation (min) (ACUTE ONLY) 20 min   Pt received in sitting EOB, agreeable to mobility. Modi to stand and ambulate with no AD. Tolerated well, see O2 sats below. Audible SOB near EOS. Returned pt sitting EOB, all needs met.   SpO2 95% on 2L at rest SpO2 92% on 2L during ambulation SpO2 dropped to 89% on 2L during ambulation, recovered quickly with standing rest break SpO2 93% on 2L after session  Glinda Lapping Mobility Specialist Please contact via SecureChat or  Rehab office at [phone]

## 2024-01-12 NOTE — TOC CM/SW Note (Signed)
 Transition of Care [facility]) - Inpatient Brief Assessment   Patient Details  Name: Abigail Gill MRN: [id number] Date of Birth: September 01, 1961  Transition of [facility]) CM/SW Contact:    Cyndie Dredge, LCSWA Phone Number: 01/12/2024, 7:25 AM   Clinical Narrative:  (TOC) has reviewed patient and no TOC needs have been identified at this time. We will continue to monitor patient advancement through interdisciplinary progression rounds. If new patient transition needs arise, please place a TOC consult.  Transition of Care Asessment: Insurance and Status: Insurance coverage has been reviewed Patient has primary care physician: Yes Home environment has been reviewed: Single Family Home Prior level of function:: Independent Prior/Current Home Services: No current home services Social Drivers of Health Review: SDOH reviewed no interventions necessary Readmission risk has been reviewed: Yes Transition of care needs: no transition of care needs at this time

## 2024-01-13 DIAGNOSIS — J101 Influenza due to other identified influenza virus with other respiratory manifestations: Secondary | ICD-10-CM | POA: Diagnosis not present

## 2024-01-13 LAB — COMPREHENSIVE METABOLIC PANEL WITH GFR
ALT: 17 U/L (ref 0–44)
AST: 15 U/L (ref 15–41)
Albumin: 3.1 g/dL — ABNORMAL LOW (ref 3.5–5.0)
Alkaline Phosphatase: 57 U/L (ref 38–126)
Anion gap: 7 (ref 5–15)
BUN: 16 mg/dL (ref 8–23)
CO2: 28 mmol/L (ref 22–32)
Calcium: 8.4 mg/dL — ABNORMAL LOW (ref 8.9–10.3)
Chloride: 104 mmol/L (ref 98–111)
Creatinine, Ser: 0.62 mg/dL (ref 0.44–1.00)
GFR, Estimated: >60 mL/min (ref >60)
Glucose, Bld: 118 mg/dL — ABNORMAL HIGH (ref 70–99)
Potassium: 4.7 mmol/L (ref 3.5–5.1)
Sodium: 139 mmol/L (ref 135–145)
Total Bilirubin: 0.4 mg/dL (ref 0.0–1.2)
Total Protein: 5.9 g/dL — ABNORMAL LOW (ref 6.5–8.1)

## 2024-01-13 LAB — CBC
HCT: 35.3 % — ABNORMAL LOW (ref 36.0–46.0)
Hemoglobin: 11.2 g/dL — ABNORMAL LOW (ref 12.0–15.0)
MCH: 28.3 pg (ref 26.0–34.0)
MCHC: 31.7 g/dL (ref 30.0–36.0)
MCV: 89.1 fL (ref 80.0–100.0)
Platelets: 149 10*3/uL — ABNORMAL LOW (ref 150–400)
RBC: 3.96 MIL/uL (ref 3.87–5.11)
RDW: 13.2 % (ref 11.5–15.5)
WBC: 10 10*3/uL (ref 4.0–10.5)
nRBC: 0 % (ref 0.0–0.2)

## 2024-01-13 LAB — GLUCOSE, CAPILLARY: Glucose-Capillary: 103 mg/dL — ABNORMAL HIGH (ref 70–99)

## 2024-01-13 LAB — MAGNESIUM: Magnesium: 2.5 mg/dL — ABNORMAL HIGH (ref 1.7–2.4)

## 2024-01-13 LAB — PHOSPHORUS: Phosphorus: 3.2 mg/dL (ref 2.5–4.6)

## 2024-01-13 MED ORDER — ORAL CARE MOUTH RINSE: 15.0000 mL | OROMUCOSAL | Status: DC | PRN

## 2024-01-13 MED ORDER — PREDNISONE 10 MG PO TABS: ORAL_TABLET | ORAL | 0 refills | Status: AC

## 2024-01-13 MED ORDER — CHLORASEPTIC 1.4 % MT LIQD
1.0000 | OROMUCOSAL | 0 refills | Status: AC | PRN
Start: 2024-01-13 — End: ?

## 2024-01-13 MED ORDER — OSELTAMIVIR PHOSPHATE 75 MG PO CAPS: 75.0000 mg | ORAL_CAPSULE | Freq: Two times a day (BID) | ORAL | 0 refills | Status: AC

## 2024-01-13 NOTE — Plan of Care (Signed)

## 2024-01-13 NOTE — Discharge Summary (Signed)
 Physician Discharge Summary  Abigail Gill XLK:[id number] DOB: 19-Jul-1961 DOA: 01/11/2024  PCP: Meldon Sport, MD  Admit date: 01/11/2024 Discharge date: 01/13/2024  Admitted From: home Disposition:  home  Recommendations for Outpatient Follow-up:  Follow up with PCP in 1-2 weeks  Home Health: none Equipment/Devices: none  Discharge Condition: stable CODE STATUS: Full code Diet Orders (From admission, onward)     Start     Ordered   01/11/24 2250  Diet Heart Room service appropriate? Yes; Fluid consistency: Thin  Diet effective now       Question Answer Comment  Room service appropriate? Yes   Fluid consistency: Thin      01/11/24 2252            HPI: Per admitting MD, Abigail Gill is a 63 y.o. female with hx of COPD, CHRF on 2 L O2, CAD, pre-DM, HLD, who presents with worsening shortness of breath.  Reports she has sick contact with her son who has a flulike illness.  Over the past 4 days she developed cough productive with white sputum, sore throat, myalgias, chills.  Has had progressive worsening shortness of breath to the point where she is short of breath even at rest.  Normally wears 2 L of O2 as needed.  Checks O2 sats and had a sat 88% on her normal 2 L, had increased to 5 L at home.  Has been using home inhalers as prescribed.   Hospital Course / Discharge diagnoses: Principal Problem:   Influenza B Active Problems:   Acute on chronic respiratory failure with hypoxia (HCC)   COPD with acute exacerbation (HCC)   Principal problem COPD exacerbation, influenza B pneumonia, acute on chronic hypoxic respiratory failure -patient was admitted to the hospital with influenza B and acute COPD exacerbation with acute on chronic hypoxic respiratory failure.  She was placed on Tamiflu, empiric antibiotics, steroids, nebulizers with improvement in her respiratory status.  Her wheezing has now resolved, she is feeling back to baseline, able to ambulate without  difficulties and will be discharged home in stable condition.  She is to finish her home doxycycline that was prescribed prior to this admission, will be given Tamiflu to complete a 5-day course and placed on a steroid taper.    Active problems CAD-no chest pain, continue aspirin, statin Hyperlipidemia-continue statin Hypokalemia-potassium normalized after repletion  Hypomagnesemia-magnesium History of prediabetes, steroid-induced hyperglycemia -CBGs acceptable in the low 100s on discharge  Sepsis ruled out   Discharge Instructions   Allergies as of 01/13/2024       Reactions   Alendronate  Sodium Nausea And Vomiting   Vomiting within minutes of taking: Can take with crackers or food and can keep it down   Other Nausea And Vomiting   Morphine  analogues    Pregabalin Other (See Comments)   Headaches Lyrica         Medication List     TAKE these medications    albuterol  108 (90 Base) MCG/ACT inhaler Commonly known as: VENTOLIN  HFA Inhale 2 puffs into the lungs every 4 (four) hours as needed. For shortness of breath What changed:  reasons to take this additional instructions   alendronate  70 MG tablet Commonly known as: FOSAMAX  Take 1 tablet (70 mg total) by mouth every 7 (seven) days. Take with a full glass of water on an empty stomach.   aspirin 81 MG chewable tablet Chew 1 tablet by mouth daily.   atorvastatin  10 MG tablet Commonly known as: LIPITOR  Take 1 tablet (10 mg total) by mouth at bedtime.   Breztri  Aerosphere 160-9-4.8 MCG/ACT Aero inhaler Generic drug: budeson-glycopyrrolate -formoterol Inhale 2 puffs into the lungs 2 (two) times daily.   cetirizine 10 MG tablet Commonly known as: ZYRTEC Take 10 mg by mouth as needed for allergies or rhinitis.   Chloraseptic 1.4 % Liqd Generic drug: phenol Use as directed 1 spray in the mouth or throat as needed for throat irritation / pain.   cholecalciferol 25 MCG (1000 UNIT) tablet Commonly known as: VITAMIN  D3 Take 5,000 Units by mouth daily.   doxycycline 100 MG capsule Commonly known as: VIBRAMYCIN Take 100 mg by mouth 2 (two) times daily.   naloxone 4 MG/0.1ML Liqd nasal spray kit Commonly known as: NARCAN Place 1 spray into the nose once.   oseltamivir 75 MG capsule Commonly known as: TAMIFLU Take 1 capsule (75 mg total) by mouth 2 (two) times daily for 3 days.   oxyCODONE-acetaminophen  5-325 MG tablet Commonly known as: PERCOCET/ROXICET Take 1 tablet by mouth every 6 (six) hours as needed for moderate pain (pain score 4-6).   predniSONE  10 MG tablet Commonly known as: DELTASONE  Take 4 tablets (40 mg total) by mouth daily for 2 days, THEN 3 tablets (30 mg total) daily for 2 days, THEN 2 tablets (20 mg total) daily for 2 days, THEN 1 tablet (10 mg total) daily for 2 days. Start taking on: January 13, 2024 What changed:  medication strength See the new instructions.       Consultations: none  Procedures/Studies:  DG Chest 2 View Result Date: 01/11/2024 CLINICAL DATA:  Shortness of breath. EXAM: CHEST - 2 VIEW COMPARISON:  09/06/2023 FINDINGS: The cardiac silhouette, mediastinal and hilar contours are within normal limits and stable. Underlying emphysematous changes and pulmonary scarring. There are bilateral pulmonary infiltrates notably in the right middle lobe and left lower lobe. No pleural effusions. No pneumothorax. The bony thorax is intact. Remote vertebral augmentation changes noted at T8. IMPRESSION: 1. Bilateral pulmonary infiltrates. 2. Underlying emphysematous changes and pulmonary scarring. Electronically Signed   By: Marrian Siva M.D.   On: 01/11/2024 20:16     Subjective: - no chest pain, shortness of breath, no abdominal pain, nausea or vomiting.   Discharge Exam: BP 102/84 (BP Location: Left Arm)   Pulse 73   Temp 97.9 F (36.6 C) (Oral)   Resp 16   Ht 5\' 2"  (1.575 m)   Wt 71.7 kg   SpO2 99%   BMI 28.92 kg/m   General: Pt is alert, awake, not in  acute distress Cardiovascular: RRR, S1/S2 +, no rubs, no gallops Respiratory: CTA bilaterally, no wheezing, no rhonchi Abdominal: Soft, NT, ND, bowel sounds + Extremities: no edema, no cyanosis    The results of significant diagnostics from this hospitalization (including imaging, microbiology, ancillary and laboratory) are listed below for reference.     Microbiology: Recent Results (from the past 240 hours)  Blood culture (routine x 2)     Status: None (Preliminary result)   Collection Time: 01/11/24  6:36 PM   Specimen: Right Antecubital; Blood  Result Value Ref Range Status   Specimen Description RIGHT ANTECUBITAL  Final   Special Requests   Final    BOTTLES DRAWN AEROBIC AND ANAEROBIC Blood Culture adequate volume   Culture   Final    NO GROWTH 2 DAYS Performed at [facility], [address], [place], [place] [address]    Report Status PENDING  Incomplete  Blood culture (  routine x 2)     Status: None (Preliminary result)   Collection Time: 01/11/24  6:46 PM   Specimen: Left Antecubital; Blood  Result Value Ref Range Status   Specimen Description LEFT ANTECUBITAL  Final   Special Requests   Final    BOTTLES DRAWN AEROBIC AND ANAEROBIC Blood Culture adequate volume   Culture   Final    NO GROWTH 2 DAYS Performed at [facility], [address] [place], [place] [address]    Report Status PENDING  Incomplete  Resp panel by RT-PCR (RSV, Flu A&B, Covid) Anterior Nasal Swab     Status: Abnormal   Collection Time: 01/11/24  8:11 PM   Specimen: Anterior Nasal Swab  Result Value Ref Range Status   SARS Coronavirus 2 by RT PCR NEGATIVE NEGATIVE Final    Comment: (NOTE) SARS-CoV-2 target nucleic acids are NOT DETECTED.  The SARS-CoV-2 RNA is generally detectable in upper respiratory specimens during the acute phase of infection. The lowest concentration of SARS-CoV-2 viral copies this assay can detect is 138 copies/mL. A negative result does not preclude SARS-Cov-2 infection  and should not be used as the sole basis for treatment or other patient management decisions. A negative result may occur with  improper specimen collection/handling, submission of specimen other than nasopharyngeal swab, presence of viral mutation(s) within the areas targeted by this assay, and inadequate number of viral copies(<138 copies/mL). A negative result must be combined with clinical observations, patient history, and epidemiological information. The expected result is Negative.  Fact Sheet for Patients:  BloggerCourse.com  Fact Sheet for Healthcare Providers:  SeriousBroker.it  This test is no t yet approved or cleared by the [place]  FDA and  has been authorized for detection and/or diagnosis of SARS-CoV-2 by FDA under an Emergency Use Authorization (EUA). This EUA will remain  in effect (meaning this test can be used) for the duration of the COVID-19 declaration under Section 564(b)(1) of the Act, 21 U.S.C.section 360bbb-3(b)(1), unless the authorization is terminated  or revoked sooner.       Influenza A by PCR NEGATIVE NEGATIVE Final   Influenza B by PCR POSITIVE (A) NEGATIVE Final    Comment: (NOTE) The Xpert Xpress SARS-CoV-2/FLU/RSV plus assay is intended as an aid in the diagnosis of influenza from Nasopharyngeal swab specimens and should not be used as a sole basis for treatment. Nasal washings and aspirates are unacceptable for Xpert Xpress SARS-CoV-2/FLU/RSV testing.  Fact Sheet for Patients: BloggerCourse.com  Fact Sheet for Healthcare Providers: SeriousBroker.it  This test is not yet approved or cleared by the [place]  FDA and has been authorized for detection and/or diagnosis of SARS-CoV-2 by FDA under an Emergency Use Authorization (EUA). This EUA will remain in effect (meaning this test can be used) for the duration of the COVID-19  declaration under Section 564(b)(1) of the Act, 21 U.S.C. section 360bbb-3(b)(1), unless the authorization is terminated or revoked.     Resp Syncytial Virus by PCR NEGATIVE NEGATIVE Final    Comment: (NOTE) Fact Sheet for Patients: BloggerCourse.com  Fact Sheet for Healthcare Providers: SeriousBroker.it  This test is not yet approved or cleared by the [place]  FDA and has been authorized for detection and/or diagnosis of SARS-CoV-2 by FDA under an Emergency Use Authorization (EUA). This EUA will remain in effect (meaning this test can be used) for the duration of the COVID-19 declaration under Section 564(b)(1) of the Act, 21 U.S.C. section 360bbb-3(b)(1), unless the authorization is terminated or revoked.  Performed at  [facility], [address] [place], [place] [address]   MRSA Next Gen by PCR, Nasal     Status: None   Collection Time: 01/12/24 12:50 AM   Specimen: Nasal Mucosa; Nasal Swab  Result Value Ref Range Status   MRSA by PCR Next Gen NOT DETECTED NOT DETECTED Final    Comment: (NOTE) The GeneXpert MRSA Assay (FDA approved for NASAL specimens only), is one component of a comprehensive MRSA colonization surveillance program. It is not intended to diagnose MRSA infection nor to guide or monitor treatment for MRSA infections. Test performance is not FDA approved in patients less than 73 years old. Performed at [facility], [address] [place], [place] [address]      Labs: Basic Metabolic Panel: Recent Labs  Lab 01/11/24 1836 01/12/24 0501 01/13/24 0417  NA 137 135 139  K 3.5 2.9* 4.7  CL 100 98 104  CO2 26 25 28   GLUCOSE 175* 232* 118*  BUN 14 12 16   CREATININE 0.71 0.76 0.62  CALCIUM  8.5* 7.7* 8.4*  MG  --  1.6* 2.5*  PHOS  --  3.3 3.2   Liver Function Tests: Recent Labs  Lab 01/11/24 1836 01/13/24 0417  AST 21 15  ALT 21 17  ALKPHOS 92 57  BILITOT 0.6 0.4  PROT 7.3 5.9*  ALBUMIN 3.9  3.1*   CBC: Recent Labs  Lab 01/11/24 1836 01/12/24 0501 01/13/24 0417  WBC 7.2 10.9* 10.0  NEUTROABS 6.7  --   --   HGB 13.9 11.9* 11.2*  HCT 42.7 36.1 35.3*  MCV 88.0 88.5 89.1  PLT 149* 152 149*   CBG: Recent Labs  Lab 01/12/24 1111 01/12/24 1617 01/12/24 2103 01/13/24 0726  GLUCAP 158* 157* 125* 103*   Hgb A1c No results for input(s): "HGBA1C" in the last 72 hours. Lipid Profile No results for input(s): "CHOL", "HDL", "LDLCALC", "TRIG", "CHOLHDL", "LDLDIRECT" in the last 72 hours. Thyroid  function studies No results for input(s): "TSH", "T4TOTAL", "T3FREE", "THYROIDAB" in the last 72 hours.  Invalid input(s): "FREET3" Urinalysis    Component Value Date/Time   COLORURINE YELLOW 01/11/2024 2008   APPEARANCEUR CLEAR 01/11/2024 2008   LABSPEC 1.025 01/11/2024 2008   PHURINE 5.0 01/11/2024 2008   GLUCOSEU 150 (A) 01/11/2024 2008   HGBUR SMALL (A) 01/11/2024 2008   BILIRUBINUR NEGATIVE 01/11/2024 2008   BILIRUBINUR Negative 07/13/2020 1009   KETONESUR 20 (A) 01/11/2024 2008   PROTEINUR 30 (A) 01/11/2024 2008   UROBILINOGEN 0.2 07/13/2020 1009   NITRITE NEGATIVE 01/11/2024 2008   LEUKOCYTESUR NEGATIVE 01/11/2024 2008    FURTHER DISCHARGE INSTRUCTIONS:   Get Medicines reviewed and adjusted: Please take all your medications with you for your next visit with your Primary MD   Laboratory/radiological data: Please request your Primary MD to go over all hospital tests and procedure/radiological results at the follow up, please ask your Primary MD to get all Hospital records sent to his/her office.   In some cases, they will be blood work, cultures and biopsy results pending at the time of your discharge. Please request that your primary care M.D. goes through all the records of your hospital data and follows up on these results.   Also Note the following: If you experience worsening of your admission symptoms, develop shortness of breath, life threatening  emergency, suicidal or homicidal thoughts you must seek medical attention immediately by calling 911 or calling your MD immediately  if symptoms less severe.   You must read complete instructions/literature along  with all the possible adverse reactions/side effects for all the Medicines you take and that have been prescribed to you. Take any new Medicines after you have completely understood and accpet all the possible adverse reactions/side effects.    Do not drive when taking Pain medications or sleeping medications (Benzodaizepines)   Do not take more than prescribed Pain, Sleep and Anxiety Medications. It is not advisable to combine anxiety,sleep and pain medications without talking with your primary care practitioner   Special Instructions: If you have smoked or chewed Tobacco  in the last 2 yrs please stop smoking, stop any regular Alcohol  and or any Recreational drug use.   Wear Seat belts while driving.   Please note: You were cared for by a hospitalist during your hospital stay. Once you are discharged, your primary care physician will handle any further medical issues. Please note that NO REFILLS for any discharge medications will be authorized once you are discharged, as it is imperative that you return to your primary care physician (or establish a relationship with a primary care physician if you do not have one) for your post hospital discharge needs so that they can reassess your need for medications and monitor your lab values.  Time coordinating discharge: 35 minutes  SIGNED:  Kathlen Para, MD, PhD 01/13/2024, 8:18 AM

## 2024-01-13 NOTE — Plan of Care (Signed)

## 2024-01-13 NOTE — Discharge Instructions (Signed)
 Follow with Abigail Sport, MD in 5-7 days  Please get a complete blood count and chemistry panel checked by your Primary MD at your next visit, and again as instructed by your Primary MD. Please get your medications reviewed and adjusted by your Primary MD.  Please request your Primary MD to go over all Hospital Tests and Procedure/Radiological results at the follow up, please get all Hospital records sent to your Prim MD by signing hospital release before you go home.  In some cases, there will be blood work, cultures and biopsy results pending at the time of your discharge. Please request that your primary care M.D. goes through all the records of your hospital data and follows up on these results.  If you had Pneumonia of Lung problems at the Hospital: Please get a 2 view Chest X ray done in 6-8 weeks after hospital discharge or sooner if instructed by your Primary MD.  If you have Congestive Heart Failure: Please call your Cardiologist or Primary MD anytime you have any of the following symptoms:  1) 3 pound weight gain in 24 hours or 5 pounds in 1 week  2) shortness of breath, with or without a dry hacking cough  3) swelling in the hands, feet or stomach  4) if you have to sleep on extra pillows at night in order to breathe  Follow cardiac low salt diet and 1.5 lit/day fluid restriction.  If you have diabetes Accuchecks 4 times/day, Once in AM empty stomach and then before each meal. Log in all results and show them to your primary doctor at your next visit. If any glucose reading is under 80 or above 300 call your primary MD immediately.  If you have Seizure/Convulsions/Epilepsy: Please do not drive, operate heavy machinery, participate in activities at heights or participate in high speed sports until you have seen by Primary MD or a Neurologist and advised to do so again. Per [place]  DMV statutes, patients with seizures are not allowed to drive until they have been  seizure-free for six months.  Use caution when using heavy equipment or power tools. Avoid working on ladders or at heights. Take showers instead of baths. Ensure the water temperature is not too high on the home water heater. Do not go swimming alone. Do not lock yourself in a room alone (i.e. bathroom). When caring for infants or small children, sit down when holding, feeding, or changing them to minimize risk of injury to the child in the event you have a seizure. Maintain good sleep hygiene. Avoid alcohol.   If you had Gastrointestinal Bleeding: Please ask your Primary MD to check a complete blood count within one week of discharge or at your next visit. Your endoscopic/colonoscopic biopsies that are pending at the time of discharge, will also need to followed by your Primary MD.  Get Medicines reviewed and adjusted. Please take all your medications with you for your next visit with your Primary MD  Please request your Primary MD to go over all hospital tests and procedure/radiological results at the follow up, please ask your Primary MD to get all Hospital records sent to his/her office.  If you experience worsening of your admission symptoms, develop shortness of breath, life threatening emergency, suicidal or homicidal thoughts you must seek medical attention immediately by calling 911 or calling your MD immediately  if symptoms less severe.  You must read complete instructions/literature along with all the possible adverse reactions/side effects for all the Medicines you  take and that have been prescribed to you. Take any new Medicines after you have completely understood and accpet all the possible adverse reactions/side effects.   Do not drive or operate heavy machinery when taking Pain medications.   Do not take more than prescribed Pain, Sleep and Anxiety Medications  Special Instructions: If you have smoked or chewed Tobacco  in the last 2 yrs please stop smoking, stop any regular  Alcohol  and or any Recreational drug use.  Wear Seat belts while driving.  Please note You were cared for by a hospitalist during your hospital stay. If you have any questions about your discharge medications or the care you received while you were in the hospital after you are discharged, you can call the unit and asked to speak with the hospitalist on call if the hospitalist that took care of you is not available. Once you are discharged, your primary care physician will handle any further medical issues. Please note that NO REFILLS for any discharge medications will be authorized once you are discharged, as it is imperative that you return to your primary care physician (or establish a relationship with a primary care physician if you do not have one) for your aftercare needs so that they can reassess your need for medications and monitor your lab values.  You can reach the hospitalist office at phone [phone] or fax [phone]   If you do not have a primary care physician, you can call [phone] for a physician referral.  Activity: As tolerated with Full fall precautions use walker/cane & assistance as needed    Diet: regular  Disposition Home

## 2024-01-15 ENCOUNTER — Ambulatory Visit: Payer: Medicare Other | Admitting: Internal Medicine

## 2024-01-15 DIAGNOSIS — G90512 Complex regional pain syndrome I of left upper limb: Secondary | ICD-10-CM | POA: Diagnosis not present

## 2024-01-15 DIAGNOSIS — Z79891 Long term (current) use of opiate analgesic: Secondary | ICD-10-CM | POA: Diagnosis not present

## 2024-01-16 ENCOUNTER — Encounter: Payer: Self-pay | Admitting: Internal Medicine

## 2024-01-16 ENCOUNTER — Ambulatory Visit (INDEPENDENT_AMBULATORY_CARE_PROVIDER_SITE_OTHER): Payer: Medicare Other | Admitting: Internal Medicine

## 2024-01-16 VITALS — BP 115/78 | HR 90 | Ht 62.0 in | Wt 160.8 lb

## 2024-01-16 DIAGNOSIS — G90512 Complex regional pain syndrome I of left upper limb: Secondary | ICD-10-CM | POA: Diagnosis not present

## 2024-01-16 DIAGNOSIS — J9621 Acute and chronic respiratory failure with hypoxia: Secondary | ICD-10-CM

## 2024-01-16 DIAGNOSIS — M8000XD Age-related osteoporosis with current pathological fracture, unspecified site, subsequent encounter for fracture with routine healing: Secondary | ICD-10-CM | POA: Diagnosis not present

## 2024-01-16 DIAGNOSIS — J441 Chronic obstructive pulmonary disease with (acute) exacerbation: Secondary | ICD-10-CM | POA: Diagnosis not present

## 2024-01-16 DIAGNOSIS — E782 Mixed hyperlipidemia: Secondary | ICD-10-CM

## 2024-01-16 DIAGNOSIS — J101 Influenza due to other identified influenza virus with other respiratory manifestations: Secondary | ICD-10-CM

## 2024-01-16 LAB — CULTURE, BLOOD (ROUTINE X 2)
Culture: NO GROWTH
Culture: NO GROWTH
Special Requests: ADEQUATE
Special Requests: ADEQUATE

## 2024-01-16 NOTE — Progress Notes (Signed)
 Established Patient Office Visit  Subjective:  Patient ID: Abigail Gill, female    DOB: 08-Apr-1961  Age: 63 y.o. MRN: [id number]  CC:  Chief Complaint  Patient presents with   Medical Management of Chronic Issues    6 month f/u, pt reports just leaving the hospital from flu/pneumonia. Still  having cough, and is hoarse.     HPI Abigail Gill is a 63 y.o. female with past medical history of atherosclerosis of aorta, HLD, COPD, osteoporosis with history of thoracic and lumbar compression fractures and environmental allergies who presents for f/u of her chronic medical conditions.  COPD: She was recently admitted for COPD exacerbation in the setting of influenza B infection.  She has noticed improvement in her cough and dyspnea since getting home from recent hospitalization.  She is taking oral prednisone  taper and doxycycline.  She has responded well to Breztri .  She also uses albuterol  inhaler as needed for dyspnea or wheezing.  She also uses O2 as needed for dyspnea, at bedtime and upon exertion.  She has chronic left upper extremity pain since left wrist internal fixation surgery in 2021, complicated by complex regional pain syndrome.  She sees [facility], and takes Percocet as needed for severe pain now.  She has had sympathetic block without much relief.  She has tried Lyrica and Cymbalta  without much relief.   She has history of osteoporosis and compression fractures of thoracic and lumbar vertebra.  She is taking alendronate  now.  Her last DEXA scan showed persistent osteoporosis, for which she is currently taking calcium  and vitamin D  supplement.  She decided not to take Prolia  after osteoporosis clinic f/u due to its side effects.   She reports chronic low back pain, from old compression fracture of lumbar spine.  She has tried taking Tylenol , but has resistant pain at times.  Denies any numbness or tingling of the LE.  Denies saddle anesthesia, urinary or stool  incontinence.    Past Medical History:  Diagnosis Date   Aortic atherosclerosis (HCC)    Asthma    Back pain    Closed fracture of left distal radius and ulna 12/03/2019   Last Assessment & Plan:   Formatting of this note might be different from the original.  Treatment:  1.  I agreed to let patient stay home for 2 weeks and do her home exercises only however I wish to see the patient in 2 weeks to verify she is continue to improve her finger range of motion and wrist range of motion.  If she is not showing significant improvement at that time we will send her back    Complex regional pain syndrome type 1 of left upper extremity 03/14/2020   COPD (chronic obstructive pulmonary disease) (HCC)    Former cigarette smoker 07/07/2017   GERD (gastroesophageal reflux disease)    Hyperlipidemia 02/15/2021   Osteoporosis    Shortness of breath    with exertion   Vitamin D  deficiency     Past Surgical History:  Procedure Laterality Date   BREAST BIOPSY Left    benign   BREAST ENHANCEMENT SURGERY  1996   bilateral-[place]   BREAST EXCISIONAL BIOPSY Left    benign   BREAST LUMPECTOMY  08/30/2011   Procedure: LUMPECTOMY;  Surgeon: Lovena Rubinstein;  Location: [facility];  Service: General;  Laterality: Left;   KYPHOPLASTY  2018   TUBAL LIGATION  1985   [name]    Family History  Problem Relation Age of Onset   Alcohol abuse Mother    Arthritis Mother    COPD Mother    Hyperlipidemia Mother    Pancreatitis Mother    Heart failure Father    Alcohol abuse Father    Arthritis Father    Diabetes Father    Heart disease Father        several heart attacks   Hyperlipidemia Father    Hypertension Father    Kidney disease Father    Stroke Father        several strokes   COPD Sister    Hypertension Sister    Anesthesia problems Neg Hx    Hypotension Neg Hx    Malignant hyperthermia Neg Hx    Pseudochol deficiency Neg Hx     Social History   Socioeconomic History    Marital status: Married    Spouse name: [name]   Number of children: 3   Years of education: 11   Highest education level: Not on file  Occupational History   Occupation: disabled    Comment: COPD  Tobacco Use   Smoking status: Former    Current packs/day: 0.00    Average packs/day: 1 pack/day for 32.6 years (32.6 ttl pk-yrs)    Types: Cigarettes    Start date: 09/16/1976    Quit date: 04/18/2009    Years since quitting: 14.7    Passive exposure: Never   Smokeless tobacco: Never  Vaping Use   Vaping status: Never Used  Substance and Sexual Activity   Alcohol use: No    Alcohol/week: 0.0 standard drinks of alcohol   Drug use: No   Sexual activity: Yes  Other Topics Concern   Not on file  Social History Narrative   Disabled due to COPD   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   1 chihuahua.   Social Drivers of Corporate investment banker Strain: Low Risk  (07/29/2023)   Received from [facility]   Overall Financial Resource Strain (CARDIA)    Difficulty of Paying Living Expenses: Not hard at all  Food Insecurity: No Food Insecurity (01/12/2024)   Hunger Vital Sign    Worried About Running Out of Food in the Last Year: Never true    Ran Out of Food in the Last Year: Never true  Transportation Needs: No Transportation Needs (01/12/2024)   PRAPARE - Administrator, Civil Service (Medical): No    Lack of Transportation (Non-Medical): No  Physical Activity: Inactive (07/14/2023)   Exercise Vital Sign    Days of Exercise per Week: 0 days    Minutes of Exercise per Session: 0 min  Stress: No Stress Concern Present (07/14/2023)   [employer] of Occupational Health - Occupational Stress Questionnaire    Feeling of Stress : Not at all  Social Connections: Socially Isolated (01/12/2024)   Social Connection and Isolation Panel [NHANES]    Frequency of Communication with Friends and Family: More than three times a week     Frequency of Social Gatherings with Friends and Family: Twice a week    Attends Religious Services: Never    Database administrator or Organizations: No    Attends Banker Meetings: Never    Marital Status: Divorced  Catering manager Violence: Not At Risk (01/12/2024)   Humiliation, Afraid, Rape, and Kick questionnaire    Fear of Current or Ex-Partner: No    Emotionally  Abused: No    Physically Abused: No    Sexually Abused: No    Outpatient Medications Prior to Visit  Medication Sig Dispense Refill   albuterol  (VENTOLIN  HFA) 108 (90 Base) MCG/ACT inhaler Inhale 2 puffs into the lungs every 4 (four) hours as needed. For shortness of breath (Patient taking differently: Inhale 2 puffs into the lungs every 4 (four) hours as needed for wheezing or shortness of breath.) 18 g 2   alendronate  (FOSAMAX ) 70 MG tablet Take 1 tablet (70 mg total) by mouth every 7 (seven) days. Take with a full glass of water on an empty stomach. 4 tablet 11   aspirin  81 MG chewable tablet Chew 1 tablet by mouth daily.     atorvastatin  (LIPITOR) 10 MG tablet Take 1 tablet (10 mg total) by mouth at bedtime. 90 tablet 1   Budeson-Glycopyrrol-Formoterol  (BREZTRI  AEROSPHERE) 160-9-4.8 MCG/ACT AERO Inhale 2 puffs into the lungs 2 (two) times daily. 10.7 g 11   cetirizine (ZYRTEC) 10 MG tablet Take 10 mg by mouth as needed for allergies or rhinitis.     cholecalciferol (VITAMIN D3) 25 MCG (1000 UT) tablet Take 5,000 Units by mouth daily.     doxycycline (VIBRAMYCIN) 100 MG capsule Take 100 mg by mouth 2 (two) times daily.     naloxone (NARCAN) nasal spray 4 mg/0.1 mL Place 1 spray into the nose once.     oseltamivir  (TAMIFLU ) 75 MG capsule Take 1 capsule (75 mg total) by mouth 2 (two) times daily for 3 days. 6 capsule 0   oxyCODONE -acetaminophen  (PERCOCET/ROXICET) 5-325 MG tablet Take 1 tablet by mouth every 6 (six) hours as needed for moderate pain (pain score 4-6).     phenol (CHLORASEPTIC) 1.4 % LIQD Use as  directed 1 spray in the mouth or throat as needed for throat irritation / pain. 118 mL 0   predniSONE  (DELTASONE ) 10 MG tablet Take 4 tablets (40 mg total) by mouth daily for 2 days, THEN 3 tablets (30 mg total) daily for 2 days, THEN 2 tablets (20 mg total) daily for 2 days, THEN 1 tablet (10 mg total) daily for 2 days. 20 tablet 0   No facility-administered medications prior to visit.    Allergies  Allergen Reactions   Alendronate  Sodium Nausea And Vomiting    Vomiting within minutes of taking: Can take with crackers or food and can keep it down   Other Nausea And Vomiting    Morphine  analogues    Pregabalin Other (See Comments)    Headaches Lyrica     ROS Review of Systems  Constitutional:  Negative for chills and fever.  HENT:  Positive for sore throat and voice change. Negative for congestion and sinus pressure.   Eyes:  Negative for pain and discharge.  Respiratory:  Positive for cough and shortness of breath (Chronic, intermittent).   Cardiovascular:  Negative for chest pain and palpitations.  Gastrointestinal:  Negative for abdominal pain, diarrhea, nausea and vomiting.  Endocrine: Negative for polydipsia and polyuria.  Genitourinary:  Negative for dysuria and hematuria.  Musculoskeletal:  Positive for arthralgias, back pain and gait problem. Negative for neck pain and neck stiffness.       Left upper extremity pain  Skin:  Negative for rash.  Neurological:  Negative for dizziness and weakness.  Psychiatric/Behavioral:  Negative for agitation and behavioral problems.       Objective:    Physical Exam Vitals reviewed.  Constitutional:      General: She is not  in acute distress.    Appearance: She is not diaphoretic.  HENT:     Head: Normocephalic and atraumatic.     Nose: No congestion.     Mouth/Throat:     Mouth: Mucous membranes are moist.     Pharynx: Posterior oropharyngeal erythema present.  Eyes:     General: No scleral icterus.    Extraocular  Movements: Extraocular movements intact.  Cardiovascular:     Rate and Rhythm: Normal rate and regular rhythm.     Heart sounds: Normal heart sounds. No murmur heard. Pulmonary:     Breath sounds: Normal breath sounds. No wheezing or rales.  Musculoskeletal:     Cervical back: Neck supple. No tenderness.     Lumbar back: Tenderness present. Decreased range of motion.     Right lower leg: No edema.     Left lower leg: No edema.  Skin:    General: Skin is warm.     Findings: No rash.  Neurological:     General: No focal deficit present.     Mental Status: She is alert and oriented to person, place, and time.     Sensory: No sensory deficit.     Motor: No weakness.  Psychiatric:        Mood and Affect: Mood normal.        Behavior: Behavior normal.     BP 115/78   Pulse 90   Ht 5\' 2"  (1.575 m)   Wt 160 lb 12.8 oz (72.9 kg)   SpO2 (!) 88%   BMI 29.41 kg/m  Wt Readings from Last 3 Encounters:  01/16/24 160 lb 12.8 oz (72.9 kg)  01/12/24 158 lb 1.6 oz (71.7 kg)  09/23/23 163 lb 6.4 oz (74.1 kg)    Lab Results  Component Value Date   TSH 1.640 07/18/2023   Lab Results  Component Value Date   WBC 10.0 01/13/2024   HGB 11.2 (L) 01/13/2024   HCT 35.3 (L) 01/13/2024   MCV 89.1 01/13/2024   PLT 149 (L) 01/13/2024   Lab Results  Component Value Date   NA 139 01/13/2024   K 4.7 01/13/2024   CO2 28 01/13/2024   GLUCOSE 118 (H) 01/13/2024   BUN 16 01/13/2024   CREATININE 0.62 01/13/2024   BILITOT 0.4 01/13/2024   ALKPHOS 57 01/13/2024   AST 15 01/13/2024   ALT 17 01/13/2024   PROT 5.9 (L) 01/13/2024   ALBUMIN 3.1 (L) 01/13/2024   CALCIUM  8.4 (L) 01/13/2024   ANIONGAP 7 01/13/2024   EGFR 97 07/18/2023   Lab Results  Component Value Date   CHOL 166 07/18/2023   Lab Results  Component Value Date   HDL 75 07/18/2023   Lab Results  Component Value Date   LDLCALC 77 07/18/2023   Lab Results  Component Value Date   TRIG 73 07/18/2023   Lab Results   Component Value Date   CHOLHDL 2.2 07/18/2023   Lab Results  Component Value Date   HGBA1C 5.9 (H) 07/18/2023      Assessment & Plan:   Problem List Items Addressed This Visit       Respiratory   Acute on chronic respiratory failure with hypoxia (HCC) - Primary   Due to recent influenza infection, also has COPD, has quit smoking more than 10 years ago Has portable O2 as needed      Influenza B   Continue Tamiflu  Symptoms overall improving, has sore throat, advised to use phenol throat spray and  perform warm water gargling      COPD with acute exacerbation (HCC)   Likely due to influenza B infection Continue Tamiflu , doxycycline and oral prednisone  taper Check CXR after 4-5 weeks      Relevant Orders   DG Chest 2 View     Nervous and Auditory   Complex regional pain syndrome type 1 of left upper extremity   Followed by [facility] -  taking Percocet PRN for severe pain Has tried stellate blocks without much relief Has tried Lyrica and Cymbalta  as well        Musculoskeletal and Integument   Osteoporosis with pathological fracture with routine healing   History of compression fracture of thoracic and lumbar vertebra s/p kyphoplasty On alendronate  Last DEXA scan reviewed -would prefer to start Prolia  or Forteo - had referred to osteoporosis clinic, but she denies to start them due to its side effects Continue calcium  and vitamin D  supplements        Other   Hyperlipidemia   On statin Advised to follow DASH diet       No orders of the defined types were placed in this encounter.   Follow-up: Return in about 6 months (around 07/18/2024) for Annual physical (after 07/18/24).    Meldon Sport, MD

## 2024-01-16 NOTE — Assessment & Plan Note (Signed)
 Continue Tamiflu  Symptoms overall improving, has sore throat, advised to use phenol throat spray and perform warm water gargling

## 2024-01-16 NOTE — Assessment & Plan Note (Addendum)
 On statin Advised to follow DASH diet

## 2024-01-16 NOTE — Patient Instructions (Signed)
 Please get x-ray of chest done in the second week of June at [facility].  Please continue to take medications as prescribed.  Please continue to follow low salt diet and perform moderate exercise/walking as tolerated.

## 2024-01-16 NOTE — Assessment & Plan Note (Addendum)
 Followed by [facility] -  taking Percocet PRN for severe pain Has tried stellate blocks without much relief Has tried Lyrica and Cymbalta  as well

## 2024-01-16 NOTE — Assessment & Plan Note (Addendum)
 History of compression fracture of thoracic and lumbar vertebra s/p kyphoplasty On alendronate  Last DEXA scan reviewed -would prefer to start Prolia  or Forteo - had referred to osteoporosis clinic, but she denies to start them due to its side effects Continue calcium  and vitamin D  supplements

## 2024-01-16 NOTE — Assessment & Plan Note (Signed)
 Due to recent influenza infection, also has COPD, has quit smoking more than 10 years ago Has portable O2 as needed

## 2024-01-16 NOTE — Assessment & Plan Note (Signed)
 Likely due to influenza B infection Continue Tamiflu , doxycycline and oral prednisone  taper Check CXR after 4-5 weeks

## 2024-01-19 ENCOUNTER — Ambulatory Visit: Attending: Cardiology | Admitting: Cardiology

## 2024-01-19 ENCOUNTER — Encounter: Payer: Self-pay | Admitting: Cardiology

## 2024-01-19 VITALS — BP 130/82 | HR 88 | Wt 161.8 lb

## 2024-01-19 DIAGNOSIS — E782 Mixed hyperlipidemia: Secondary | ICD-10-CM | POA: Diagnosis not present

## 2024-01-19 DIAGNOSIS — I2510 Atherosclerotic heart disease of native coronary artery without angina pectoris: Secondary | ICD-10-CM | POA: Diagnosis not present

## 2024-01-19 NOTE — Patient Instructions (Signed)

## 2024-01-19 NOTE — Progress Notes (Signed)
 Clinical Summary Abigail Gill is a 63 y.o.female seen today for follow up of the following medical problems.      1. Coronary atherosclerosis - noted by prior CT scan 05/2021 nuclear stress: no ischemia - no recent chest pains. Chronic SOB related to her COPD, she is on home O2.  Jan 2025 echo LVEF 65-705%, normal diastolic function,    2.COPD - followed by pcp by Dr Abigail Gill - admit 12/2023 with COPD exacerbation, influenza B  - compliant with - on O2 at home    3. HLD - 07/2023 TC 166 TG 73 HDL 75 LDL 77 - compliant with statin    Past Medical History:  Diagnosis Date   Aortic atherosclerosis (HCC)    Asthma    Back pain    Closed fracture of left distal radius and ulna 12/03/2019   Last Assessment & Plan:   Formatting of this note might be different from the original.  Treatment:  1.  I agreed to let patient stay home for 2 weeks and do her home exercises only however I wish to see the patient in 2 weeks to verify she is continue to improve her finger range of motion and wrist range of motion.  If she is not showing significant improvement at that time we will send her back    Complex regional pain syndrome type 1 of left upper extremity 03/14/2020   COPD (chronic obstructive pulmonary disease) (HCC)    Former cigarette smoker 07/07/2017   GERD (gastroesophageal reflux disease)    Hyperlipidemia 02/15/2021   Osteoporosis    Shortness of breath    with exertion   Vitamin D  deficiency      Allergies  Allergen Reactions   Alendronate  Sodium Nausea And Vomiting    Vomiting within minutes of taking: Can take with crackers or food and can keep it down   Other Nausea And Vomiting    Morphine  analogues    Pregabalin Other (See Comments)    Headaches Lyrica      Current Outpatient Medications  Medication Sig Dispense Refill   albuterol  (VENTOLIN  HFA) 108 (90 Base) MCG/ACT inhaler Inhale 2 puffs into the lungs every 4 (four) hours as needed. For shortness of breath  (Patient taking differently: Inhale 2 puffs into the lungs every 4 (four) hours as needed for wheezing or shortness of breath.) 18 g 2   alendronate  (FOSAMAX ) 70 MG tablet Take 1 tablet (70 mg total) by mouth every 7 (seven) days. Take with a full glass of water on an empty stomach. 4 tablet 11   aspirin  81 MG chewable tablet Chew 1 tablet by mouth daily.     atorvastatin  (LIPITOR) 10 MG tablet Take 1 tablet (10 mg total) by mouth at bedtime. 90 tablet 1   Budeson-Glycopyrrol-Formoterol  (BREZTRI  AEROSPHERE) 160-9-4.8 MCG/ACT AERO Inhale 2 puffs into the lungs 2 (two) times daily. 10.7 g 11   cetirizine (ZYRTEC) 10 MG tablet Take 10 mg by mouth as needed for allergies or rhinitis.     cholecalciferol (VITAMIN D3) 25 MCG (1000 UT) tablet Take 5,000 Units by mouth daily.     doxycycline (VIBRAMYCIN) 100 MG capsule Take 100 mg by mouth 2 (two) times daily.     naloxone (NARCAN) nasal spray 4 mg/0.1 mL Place 1 spray into the nose once.     oxyCODONE -acetaminophen  (PERCOCET/ROXICET) 5-325 MG tablet Take 1 tablet by mouth every 6 (six) hours as needed for moderate pain (pain score 4-6).  phenol (CHLORASEPTIC) 1.4 % LIQD Use as directed 1 spray in the mouth or throat as needed for throat irritation / pain. 118 mL 0   predniSONE  (DELTASONE ) 10 MG tablet Take 4 tablets (40 mg total) by mouth daily for 2 days, THEN 3 tablets (30 mg total) daily for 2 days, THEN 2 tablets (20 mg total) daily for 2 days, THEN 1 tablet (10 mg total) daily for 2 days. 20 tablet 0   No current facility-administered medications for this visit.     Past Surgical History:  Procedure Laterality Date   BREAST BIOPSY Left    benign   BREAST ENHANCEMENT SURGERY  1996   bilateral-Abigail Gill   BREAST EXCISIONAL BIOPSY Left    benign   BREAST LUMPECTOMY  08/30/2011   Procedure: LUMPECTOMY;  Surgeon: Abigail Gill;  Location: [facility];  Service: General;  Laterality: Left;   KYPHOPLASTY  2018   TUBAL LIGATION  1985   Abigail  Gill     Allergies  Allergen Reactions   Alendronate  Sodium Nausea And Vomiting    Vomiting within minutes of taking: Can take with crackers or food and can keep it down   Other Nausea And Vomiting    Morphine  analogues    Pregabalin Other (See Comments)    Headaches Lyrica       Family History  Problem Relation Age of Onset   Alcohol abuse Mother    Arthritis Mother    COPD Mother    Hyperlipidemia Mother    Pancreatitis Mother    Heart failure Father    Alcohol abuse Father    Arthritis Father    Diabetes Father    Heart disease Father        several heart attacks   Hyperlipidemia Father    Hypertension Father    Kidney disease Father    Stroke Father        several strokes   COPD Sister    Hypertension Sister    Anesthesia problems Neg Hx    Hypotension Neg Hx    Malignant hyperthermia Neg Hx    Pseudochol deficiency Neg Hx      Social History Abigail Gill reports that she quit smoking about 14 years ago. Her smoking use included cigarettes. She started smoking about 47 years ago. She has a 32.6 pack-year smoking history. She has never been exposed to tobacco smoke. She has never used smokeless tobacco. Abigail Gill reports no history of alcohol use.    Physical Examination Today's Vitals   01/19/24 1057  BP: 130/82  Pulse: 88  SpO2: 98%  Weight: 161 lb 12.8 oz (73.4 kg)   Body mass index is 29.59 kg/m.  Gen: resting comfortably, no acute distress HEENT: no scleral icterus, pupils equal round and reactive, no palptable cervical adenopathy,  CV: RRR, no m/rg, no jvd Resp: Clear to auscultation bilaterally GI: abdomen is soft, non-tender, non-distended, normal bowel sounds, no hepatosplenomegaly MSK: extremities are warm, no edema.  Skin: warm, no rash Neuro:  no focal deficits Psych: appropriate affect   Diagnostic Studies  04/2015 event monitor Tracings show normal sinus rhythm No symptoms reported No significant arrhythmias     01/2021 CT  chest Tracings show normal sinus rhythm No symptoms reported No significant arrhythmias   05/2021 lexiscan    The study is normal. The study is low risk. There are no perfusion defects   No ST deviation was noted.   LV perfusion is normal.   Left ventricular function is  normal.   Jan 2025 echo 1. Left ventricular ejection fraction, by estimation, is 65 to 70%. The  left ventricle has normal function. The left ventricle has no regional  wall motion abnormalities. Left ventricular diastolic parameters were  normal.   2. Right ventricular systolic function is normal. The right ventricular  size is normal. There is normal pulmonary artery systolic pressure.   3. The mitral valve is normal in structure. No evidence of mitral valve  regurgitation. No evidence of mitral stenosis.   4. The aortic valve was not well visualized. Aortic valve regurgitation  is not visualized. No aortic stenosis is present.   5. The inferior vena cava is normal in size with greater than 50%  respiratory variability, suggesting right atrial pressure of 3 mmHg.     Assessment and Plan   1. Coronary atherosclerosis - incidental findings on CT chest, LM and multivessel atherosclerosis - nuclear stress without ischemia - on aspirin , statin - denies any symptoms, continue current meds  2. HLD - at goal, continue current meds   F/u 1 year     Laurann Pollock, M.D..

## 2024-01-28 ENCOUNTER — Other Ambulatory Visit: Payer: Self-pay | Admitting: Internal Medicine

## 2024-01-28 DIAGNOSIS — J431 Panlobular emphysema: Secondary | ICD-10-CM

## 2024-01-28 DIAGNOSIS — J9611 Chronic respiratory failure with hypoxia: Secondary | ICD-10-CM

## 2024-02-28 ENCOUNTER — Other Ambulatory Visit: Payer: Self-pay | Admitting: Internal Medicine

## 2024-02-28 ENCOUNTER — Other Ambulatory Visit: Payer: Self-pay | Admitting: Cardiology

## 2024-02-28 DIAGNOSIS — I700 Atherosclerosis of aorta: Secondary | ICD-10-CM

## 2024-02-28 DIAGNOSIS — J9611 Chronic respiratory failure with hypoxia: Secondary | ICD-10-CM

## 2024-02-28 DIAGNOSIS — J431 Panlobular emphysema: Secondary | ICD-10-CM

## 2024-02-28 DIAGNOSIS — E785 Hyperlipidemia, unspecified: Secondary | ICD-10-CM

## 2024-03-01 ENCOUNTER — Ambulatory Visit: Payer: Medicare Other | Admitting: Cardiology

## 2024-03-27 ENCOUNTER — Other Ambulatory Visit: Payer: Self-pay | Admitting: Internal Medicine

## 2024-03-27 DIAGNOSIS — J431 Panlobular emphysema: Secondary | ICD-10-CM

## 2024-03-27 DIAGNOSIS — J9611 Chronic respiratory failure with hypoxia: Secondary | ICD-10-CM

## 2024-04-22 ENCOUNTER — Other Ambulatory Visit: Payer: Self-pay | Admitting: Internal Medicine

## 2024-04-22 DIAGNOSIS — G90512 Complex regional pain syndrome I of left upper limb: Secondary | ICD-10-CM | POA: Diagnosis not present

## 2024-04-22 DIAGNOSIS — J431 Panlobular emphysema: Secondary | ICD-10-CM

## 2024-04-22 DIAGNOSIS — Z79891 Long term (current) use of opiate analgesic: Secondary | ICD-10-CM | POA: Diagnosis not present

## 2024-04-22 DIAGNOSIS — J9611 Chronic respiratory failure with hypoxia: Secondary | ICD-10-CM

## 2024-05-17 ENCOUNTER — Other Ambulatory Visit: Payer: Self-pay | Admitting: Internal Medicine

## 2024-05-17 DIAGNOSIS — J431 Panlobular emphysema: Secondary | ICD-10-CM

## 2024-05-17 DIAGNOSIS — J9611 Chronic respiratory failure with hypoxia: Secondary | ICD-10-CM

## 2024-06-12 ENCOUNTER — Other Ambulatory Visit: Payer: Self-pay | Admitting: Internal Medicine

## 2024-06-12 DIAGNOSIS — J9611 Chronic respiratory failure with hypoxia: Secondary | ICD-10-CM

## 2024-06-12 DIAGNOSIS — J431 Panlobular emphysema: Secondary | ICD-10-CM

## 2024-06-29 ENCOUNTER — Other Ambulatory Visit: Payer: Self-pay | Admitting: Internal Medicine

## 2024-06-29 DIAGNOSIS — J431 Panlobular emphysema: Secondary | ICD-10-CM

## 2024-07-08 ENCOUNTER — Other Ambulatory Visit: Payer: Self-pay | Admitting: Internal Medicine

## 2024-07-08 DIAGNOSIS — J9611 Chronic respiratory failure with hypoxia: Secondary | ICD-10-CM

## 2024-07-08 DIAGNOSIS — J431 Panlobular emphysema: Secondary | ICD-10-CM

## 2024-07-14 ENCOUNTER — Ambulatory Visit: Payer: Medicare Other

## 2024-07-14 VITALS — Ht 62.0 in | Wt 160.0 lb

## 2024-07-14 DIAGNOSIS — Z1231 Encounter for screening mammogram for malignant neoplasm of breast: Secondary | ICD-10-CM

## 2024-07-14 DIAGNOSIS — Z0000 Encounter for general adult medical examination without abnormal findings: Secondary | ICD-10-CM

## 2024-07-14 NOTE — Patient Instructions (Signed)
 Ms. Couvillon,  Thank you for taking the time for your Medicare Wellness Visit. I appreciate your continued commitment to your health goals. Please review the care plan we discussed, and feel free to reach out if I can assist you further.  Medicare recommends these wellness visits once per year to help you and your care team stay ahead of potential health issues. These visits are designed to focus on prevention, allowing your provider to concentrate on managing your acute and chronic conditions during your regular appointments.  Please note that Annual Wellness Visits do not include a physical exam. Some assessments may be limited, especially if the visit was conducted virtually. If needed, we may recommend a separate in-person follow-up with your provider.  Ongoing Care  Seeing your primary care provider every 3 to 6 months helps us  monitor your health and provide consistent, personalized care.   Referrals   Mammogram at [facility] Call [phone] to schedule your screening No perfumes, lotions, or deodorants the day of your screening. You can schedule your mammogram through mychart!   Recommended Screenings:  Health Maintenance  Topic Date Due   Zoster (Shingles) Vaccine (1 of 2) Never done   COVID-19 Vaccine (2 - Janssen risk series) 02/19/2020   Screening for Lung Cancer  07/23/2023   Flu Shot  04/16/2024   Breast Cancer Screening  09/03/2024   Pneumococcal Vaccine for age over 53 (3 of 3 - PCV20 or PCV21) 01/12/2025   Medicare Annual Wellness Visit  07/14/2025   DEXA scan (bone density measurement)  09/03/2025   Colon Cancer Screening  05/28/2026   DTaP/Tdap/Td vaccine (2 - Tdap) 01/15/2027   Pap with HPV screening  02/13/2027   Hepatitis C Screening  Completed   HIV Screening  Completed   Hepatitis B Vaccine  Aged Out   Meningitis B Vaccine  Aged Out       07/14/2024   10:10 AM  Advanced Directives  Does Patient Have a Medical Advance Directive? No  Would patient  like information on creating a medical advance directive? Yes (MAU/Ambulatory/Procedural Areas - Information given)    Advance Care Planning is important because it: Ensures you receive medical care that aligns with your values, goals, and preferences. Provides guidance to your family and loved ones, reducing the emotional burden of decision-making during critical moments.  Vision: Annual vision screenings are recommended for early detection of glaucoma, cataracts, and diabetic retinopathy. These exams can also reveal signs of chronic conditions such as diabetes and high blood pressure.  Dental: Annual dental screenings help detect early signs of oral cancer, gum disease, and other conditions linked to overall health, including heart disease and diabetes.  Please see the attached documents for additional preventive care recommendations.

## 2024-07-14 NOTE — Progress Notes (Signed)
 Subjective:   Abigail Gill is a 63 y.o. who presents for a Medicare Wellness preventive visit.  As a reminder, Annual Wellness Visits don't include a physical exam, and some assessments may be limited, especially if this visit is performed virtually. We may recommend an in-person follow-up visit with your provider if needed.  Visit Complete: Virtual I connected with  Rhoda JAYSON Kapur on 07/14/24 by a audio enabled telemedicine application and verified that I am speaking with the correct person using two identifiers.  Patient Location: Home  Provider Location: Office/Clinic  I discussed the limitations of evaluation and management by telemedicine. The patient expressed understanding and agreed to proceed.  Vital Signs: Because this visit was a virtual/telehealth visit, some criteria may be missing or patient reported. Any vitals not documented were not able to be obtained and vitals that have been documented are patient reported.  VideoDeclined- This patient declined Librarian, academic. Therefore the visit was completed with audio only.  Persons Participating in Visit: Patient.  AWV Questionnaire: No: Patient Medicare AWV questionnaire was not completed prior to this visit.  Cardiac Risk Factors include: advanced age (>69men, >46 women);dyslipidemia;hypertension;sedentary lifestyle;Other (see comment), Risk factor comments: COPD     Objective:    Today's Vitals   07/14/24 1011  Weight: 160 lb (72.6 kg)  Height: 5' 2 (1.575 m)  PainSc: 0-No pain   Body mass index is 29.26 kg/m.     07/14/2024   10:10 AM 01/12/2024   12:22 AM 01/12/2024   12:11 AM 01/11/2024    5:58 PM 07/14/2023    9:59 AM 06/13/2022    8:26 AM 06/12/2021    4:09 PM  Advanced Directives  Does Patient Have a Medical Advance Directive? No  Yes No No No No  Type of Best Boy of [place];Living will      Does patient want to make changes to medical  advance directive?  No - Patient declined       Copy of Healthcare Power of Attorney in Chart?   No - copy requested      Would patient like information on creating a medical advance directive? Yes (MAU/Ambulatory/Procedural Areas - Information given)    Yes (MAU/Ambulatory/Procedural Areas - Information given) Yes (MAU/Ambulatory/Procedural Areas - Information given)     Current Medications (verified) Outpatient Encounter Medications as of 07/14/2024  Medication Sig   alendronate  (FOSAMAX ) 70 MG tablet Take 1 tablet (70 mg total) by mouth every 7 (seven) days. Take with a full glass of water on an empty stomach.   aspirin  81 MG chewable tablet Chew 1 tablet by mouth daily.   atorvastatin  (LIPITOR) 10 MG tablet TAKE 1 TABLET BY MOUTH AT BEDTIME   BREZTRI  AEROSPHERE 160-9-4.8 MCG/ACT AERO inhaler INHALE 2 PUFFS TWICE DAILY   cetirizine (ZYRTEC) 10 MG tablet Take 10 mg by mouth as needed for allergies or rhinitis.   cholecalciferol (VITAMIN D3) 25 MCG (1000 UT) tablet Take 5,000 Units by mouth daily.   doxycycline (VIBRAMYCIN) 100 MG capsule Take 100 mg by mouth 2 (two) times daily.   naloxone (NARCAN) nasal spray 4 mg/0.1 mL Place 1 spray into the nose once.   oxyCODONE -acetaminophen  (PERCOCET/ROXICET) 5-325 MG tablet Take 1 tablet by mouth every 6 (six) hours as needed for moderate pain (pain score 4-6).   phenol (CHLORASEPTIC) 1.4 % LIQD Use as directed 1 spray in the mouth or throat as needed for throat irritation / pain.   VENTOLIN  HFA  108 (90 Base) MCG/ACT inhaler INHALE 2 PUFFS BY MOUTH EVERY 4 HOURS AS NEEDED FOR SHORTNESS OF BREATH   No facility-administered encounter medications on file as of 07/14/2024.    Allergies (verified) Alendronate  sodium, Other, and Pregabalin   History: Past Medical History:  Diagnosis Date   Aortic atherosclerosis    Asthma    Back pain    Closed fracture of left distal radius and ulna 12/03/2019   Last Assessment & Plan:   Formatting of this note  might be different from the original.  Treatment:  1.  I agreed to let patient stay home for 2 weeks and do her home exercises only however I wish to see the patient in 2 weeks to verify she is continue to improve her finger range of motion and wrist range of motion.  If she is not showing significant improvement at that time we will send her back    Complex regional pain syndrome type 1 of left upper extremity 03/14/2020   COPD (chronic obstructive pulmonary disease) (HCC)    Former cigarette smoker 07/07/2017   GERD (gastroesophageal reflux disease)    Hyperlipidemia 02/15/2021   Osteoporosis    Shortness of breath    with exertion   Vitamin D  deficiency    Past Surgical History:  Procedure Laterality Date   BREAST BIOPSY Left    benign   BREAST ENHANCEMENT SURGERY  1996   bilateral-[place]   BREAST EXCISIONAL BIOPSY Left    benign   BREAST LUMPECTOMY  08/30/2011   Procedure: LUMPECTOMY;  Surgeon: Thresa JAYSON Pulling;  Location: [facility];  Service: General;  Laterality: Left;   KYPHOPLASTY  2018   TUBAL LIGATION  1985   [place]   Family History  Problem Relation Age of Onset   Alcohol abuse Mother    Arthritis Mother    COPD Mother    Hyperlipidemia Mother    Pancreatitis Mother    Heart failure Father    Alcohol abuse Father    Arthritis Father    Diabetes Father    Heart disease Father        several heart attacks   Hyperlipidemia Father    Hypertension Father    Kidney disease Father    Stroke Father        several strokes   COPD Sister    Hypertension Sister    Anesthesia problems Neg Hx    Hypotension Neg Hx    Malignant hyperthermia Neg Hx    Pseudochol deficiency Neg Hx    Social History   Socioeconomic History   Marital status: Married    Spouse name: [name]   Number of children: 3   Years of education: 11   Highest education level: Not on file  Occupational History   Occupation: disabled    Comment: COPD  Tobacco Use   Smoking status: Former     Current packs/day: 0.00    Average packs/day: 1 pack/day for 32.6 years (32.6 ttl pk-yrs)    Types: Cigarettes    Start date: 09/16/1976    Quit date: 04/18/2009    Years since quitting: 15.2    Passive exposure: Never   Smokeless tobacco: Never  Vaping Use   Vaping status: Never Used  Substance and Sexual Activity   Alcohol use: No    Alcohol/week: 0.0 standard drinks of alcohol   Drug use: No   Sexual activity: Yes  Other Topics Concern   Not on file  Social History Narrative  Disabled due to COPD   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   1 chihuahua.   Social Drivers of Corporate Investment Banker Strain: Low Risk  (07/14/2024)   Overall Financial Resource Strain (CARDIA)    Difficulty of Paying Living Expenses: Not hard at all  Food Insecurity: No Food Insecurity (07/14/2024)   Hunger Vital Sign    Worried About Running Out of Food in the Last Year: Never true    Ran Out of Food in the Last Year: Never true  Transportation Needs: No Transportation Needs (07/14/2024)   PRAPARE - Administrator, Civil Service (Medical): No    Lack of Transportation (Non-Medical): No  Physical Activity: Inactive (07/14/2024)   Exercise Vital Sign    Days of Exercise per Week: 0 days    Minutes of Exercise per Session: 0 min  Stress: No Stress Concern Present (07/14/2024)   [employer] of Occupational Health - Occupational Stress Questionnaire    Feeling of Stress: Not at all  Social Connections: Moderately Isolated (07/14/2024)   Social Connection and Isolation Panel    Frequency of Communication with Friends and Family: More than three times a week    Frequency of Social Gatherings with Friends and Family: Once a week    Attends Religious Services: More than 4 times per year    Active Member of [employer] or Organizations: No    Attends Engineer, Structural: Never    Marital Status: Divorced    Tobacco  Counseling Counseling given: Not Answered    Clinical Intake:  Pre-visit preparation completed: Yes  Pain : No/denies pain Pain Score: 0-No pain     BMI - recorded: 29.26 Nutritional Status: BMI 25 -29 Overweight Nutritional Risks: None Diabetes: No  Lab Results  Component Value Date   HGBA1C 5.9 (H) 07/18/2023   HGBA1C 5.8 (H) 07/15/2022   HGBA1C 5.8 (H) 10/25/2020     How often do you need to have someone help you when you read instructions, pamphlets, or other written materials from your doctor or pharmacy?: 1 - Never  Interpreter Needed?: No  Information entered by :: Krishav Mamone W CMA (AAMA)   Activities of Daily Living     07/14/2024   10:14 AM 01/12/2024   12:11 AM  In your present state of health, do you have any difficulty performing the following activities:  Hearing? 1 0  Comment some but not enough for testing   Vision? 0 0  Difficulty concentrating or making decisions? 0 0  Walking or climbing stairs? 1   Comment due to COPD   Dressing or bathing? 0   Doing errands, shopping? 0 0  Preparing Food and eating ? N   Using the Toilet? N   In the past six months, have you accidently leaked urine? N   Do you have problems with loss of bowel control? N   Managing your Medications? N   Managing your Finances? N   Housekeeping or managing your Housekeeping? N     Patient Care Team: Tobie Suzzane POUR, MD as PCP - General (Internal Medicine) Alvan Dorn FALCON, MD as PCP - Cardiology (Cardiology)  I have updated your Care Teams any recent Medical Services you may have received from other providers in the past year.     Assessment:   This is a routine wellness examination for Mahli.  Hearing/Vision screen Hearing Screening - Comments:: Patient states she has  a little difficulty hearing but not enough for testing  Vision Screening - Comments:: Patient has not had an eye exam in the last year. She will call the eye doctor and schedule that appt herself.      Goals Addressed               This Visit's Progress     Get my COPD under better control (pt-stated)          Depression Screen     07/14/2024   10:16 AM 01/16/2024    8:48 AM 07/18/2023   10:05 AM 07/14/2023    9:56 AM 01/09/2023    9:52 AM 07/15/2022    9:29 AM 06/13/2022    8:26 AM  PHQ 2/9 Scores  PHQ - 2 Score 0 0 0 0 0 3 0  PHQ- 9 Score 0 0    3      Fall Risk     07/14/2024   10:13 AM 01/16/2024    8:48 AM 07/18/2023   10:05 AM 07/14/2023    9:58 AM 01/09/2023    9:52 AM  Fall Risk   Falls in the past year? 0 0 0 0 0  Number falls in past yr: 0 0 0 0 0  Injury with Fall? 0 0 0 0 0  Risk for fall due to : No Fall Risks No Fall Risks  No Fall Risks   Follow up Falls evaluation completed;Education provided;Falls prevention discussed Falls evaluation completed  Falls prevention discussed;Education provided;Falls evaluation completed     MEDICARE RISK AT HOME:  Medicare Risk at Home Any stairs in or around the home?: Yes If so, are there any without handrails?: Yes Home free of loose throw rugs in walkways, pet beds, electrical cords, etc?: Yes Adequate lighting in your home to reduce risk of falls?: Yes Life alert?: No Use of a cane, walker or w/c?: Yes (as needed) Grab bars in the bathroom?: Yes Shower chair or bench in shower?: Yes Elevated toilet seat or a handicapped toilet?: Yes  TIMED UP AND GO:  Was the test performed?  No  Cognitive Function: 6CIT completed        07/14/2024   10:15 AM 07/14/2023    9:59 AM 06/13/2022    8:31 AM 06/12/2021    4:10 PM 02/11/2020   10:09 AM  6CIT Screen  What Year? 0 points 0 points 0 points 0 points 0 points  What month? 0 points 0 points 0 points 0 points 0 points  What time? 0 points 0 points 0 points 0 points 0 points  Count back from 20 0 points 0 points 0 points 0 points 0 points  Months in reverse 0 points 0 points 0 points 0 points 0 points  Repeat phrase 0 points 0 points 0 points 0 points 0 points   Total Score 0 points 0 points 0 points 0 points 0 points    Immunizations Immunization History  Administered Date(s) Administered   Influenza, Quadrivalent, Recombinant, Inj, Pf 06/25/2019   Influenza, Seasonal, Injecte, Preservative Fre 07/18/2023   Influenza,inj,Quad PF,6+ Mos 06/29/2017, 07/13/2020, 06/08/2021, 07/15/2022   Influenza-Unspecified 06/29/2017, 06/25/2019   Janssen (J&J) SARS-COV-2 Vaccination 01/22/2020   Pneumococcal Conjugate-13 07/07/2017   Pneumococcal Polysaccharide-23 01/13/2020   Td 01/14/2017    Screening Tests Health Maintenance  Topic Date Due   Zoster Vaccines- Shingrix (1 of 2) Never done   COVID-19 Vaccine (2 - Janssen risk series) 02/19/2020   Lung Cancer Screening  07/23/2023  Influenza Vaccine  04/16/2024   Mammogram  09/03/2024   Pneumococcal Vaccine: 50+ Years (3 of 3 - PCV20 or PCV21) 01/12/2025   Medicare Annual Wellness (AWV)  07/14/2025   DEXA SCAN  09/03/2025   Colonoscopy  05/28/2026   DTaP/Tdap/Td (2 - Tdap) 01/15/2027   Cervical Cancer Screening (HPV/Pap Cotest)  02/13/2027   Hepatitis C Screening  Completed   HIV Screening  Completed   Hepatitis B Vaccines 19-59 Average Risk  Aged Out   Meningococcal B Vaccine  Aged Out    Health Maintenance Health Maintenance Due  Topic Date Due   Zoster Vaccines- Shingrix (1 of 2) Never done   COVID-19 Vaccine (2 - Janssen risk series) 02/19/2020   Lung Cancer Screening  07/23/2023   Influenza Vaccine  04/16/2024   Mammogram  09/03/2024   Health Maintenance Items Addressed: Mammogram ordered  Additional Screening:  Vision Screening: Recommended annual ophthalmology exams for early detection of glaucoma and other disorders of the eye. Would you like a referral to an eye doctor? No    Dental Screening: Recommended annual dental exams for proper oral hygiene  Community Resource Referral / Chronic Care Management: CRR required this visit?  No   CCM required this visit?   No   Plan:    I have personally reviewed and noted the following in the patient's chart:   Medical and social history Use of alcohol, tobacco or illicit drugs  Current medications and supplements including opioid prescriptions. Patient is currently taking opioid prescriptions. Information provided to patient regarding non-opioid alternatives. Patient advised to discuss non-opioid treatment plan with their provider. Functional ability and status Nutritional status Physical activity Advanced directives List of other physicians Hospitalizations, surgeries, and ER visits in previous 12 months Vitals Screenings to include cognitive, depression, and falls Referrals and appointments  In addition, I have reviewed and discussed with patient certain preventive protocols, quality metrics, and best practice recommendations. A written personalized care plan for preventive services as well as general preventive health recommendations were provided to patient.   Kariel Skillman, CMA   07/14/2024   After Visit Summary: (Mail) Due to this being a telephonic visit, the after visit summary with patients personalized plan was offered to patient via mail   Notes: Nothing significant to report at this time.

## 2024-07-21 ENCOUNTER — Encounter: Payer: Self-pay | Admitting: Internal Medicine

## 2024-07-21 ENCOUNTER — Ambulatory Visit (INDEPENDENT_AMBULATORY_CARE_PROVIDER_SITE_OTHER): Admitting: Internal Medicine

## 2024-07-21 VITALS — BP 123/75 | HR 77 | Ht 62.0 in | Wt 159.4 lb

## 2024-07-21 DIAGNOSIS — E782 Mixed hyperlipidemia: Secondary | ICD-10-CM

## 2024-07-21 DIAGNOSIS — Z23 Encounter for immunization: Secondary | ICD-10-CM

## 2024-07-21 DIAGNOSIS — M810 Age-related osteoporosis without current pathological fracture: Secondary | ICD-10-CM

## 2024-07-21 DIAGNOSIS — R7303 Prediabetes: Secondary | ICD-10-CM

## 2024-07-21 DIAGNOSIS — I700 Atherosclerosis of aorta: Secondary | ICD-10-CM

## 2024-07-21 DIAGNOSIS — Z122 Encounter for screening for malignant neoplasm of respiratory organs: Secondary | ICD-10-CM

## 2024-07-21 DIAGNOSIS — E559 Vitamin D deficiency, unspecified: Secondary | ICD-10-CM

## 2024-07-21 DIAGNOSIS — J431 Panlobular emphysema: Secondary | ICD-10-CM

## 2024-07-21 DIAGNOSIS — M8000XD Age-related osteoporosis with current pathological fracture, unspecified site, subsequent encounter for fracture with routine healing: Secondary | ICD-10-CM

## 2024-07-21 DIAGNOSIS — G90512 Complex regional pain syndrome I of left upper limb: Secondary | ICD-10-CM

## 2024-07-21 DIAGNOSIS — J9611 Chronic respiratory failure with hypoxia: Secondary | ICD-10-CM

## 2024-07-21 DIAGNOSIS — Z0001 Encounter for general adult medical examination with abnormal findings: Secondary | ICD-10-CM

## 2024-07-21 MED ORDER — BREZTRI AEROSPHERE 160-9-4.8 MCG/ACT IN AERO: 2.0000 | INHALATION_SPRAY | Freq: Two times a day (BID) | RESPIRATORY_TRACT | 11 refills | Status: AC

## 2024-07-21 MED ORDER — ALENDRONATE SODIUM 70 MG PO TABS: 70.0000 mg | ORAL_TABLET | ORAL | 11 refills | Status: AC

## 2024-07-21 NOTE — Assessment & Plan Note (Signed)
 Lab Results  Component Value Date   HGBA1C 5.9 (H) 07/18/2023   Advised to follow low-carb diet for now

## 2024-07-21 NOTE — Assessment & Plan Note (Signed)
 Followed by [facility] -  taking Percocet PRN for severe pain Has tried stellate blocks without much relief Has tried Lyrica and Cymbalta  as well

## 2024-07-21 NOTE — Assessment & Plan Note (Addendum)
 Noted on CT chest On aspirin  and statin Followed by cardiology

## 2024-07-21 NOTE — Assessment & Plan Note (Signed)
 Has COPD, has quit smoking more than 10 years ago Has portable O2 as needed

## 2024-07-21 NOTE — Assessment & Plan Note (Signed)
Well-controlled with Breztri Uses as needed albuterol Has portable O2 as needed 

## 2024-07-21 NOTE — Assessment & Plan Note (Signed)
 Physical exam as documented. Fasting blood tests today. Flu vaccine today. Advised to get Shingrix vaccine at local pharmacy.

## 2024-07-21 NOTE — Patient Instructions (Addendum)
 Please continue to take medications as prescribed.  Please continue to follow low salt diet and perform moderate exercise/walking at least 150 mins/week.  Please consider getting Shingrix vaccine at local pharmacy.

## 2024-07-21 NOTE — Assessment & Plan Note (Signed)
 Has > 20-pack-year smoking history Referred to Pulmonology program for low-dose CT chest after discussing with the patient.

## 2024-07-21 NOTE — Assessment & Plan Note (Signed)
 On statin Advised to follow DASH diet

## 2024-07-21 NOTE — Progress Notes (Signed)
 Established Patient Office Visit  Subjective:  Patient ID: Abigail Gill, female    DOB: 1961/06/16  Age: 63 y.o. MRN: [id number]  CC:  Chief Complaint  Patient presents with   Annual Exam    Cpe     HPI Abigail Gill is a 63 y.o. female with past medical history of atherosclerosis of aorta, HLD, COPD, osteoporosis with history of thoracic and lumbar compression fractures and environmental allergies who presents for f/u of her chronic medical conditions.  COPD: She has responded well to Breztri  overall.  She also uses albuterol  inhaler as needed for dyspnea or wheezing.  She also uses O2 as needed for dyspnea, at bedtime and upon exertion.  She has difficulty carrying heavy portable oxygen , but prefers to wait for a different oxygen  device for now.  She has history of osteoporosis and compression fractures of thoracic and lumbar vertebra.  She is taking alendronate  now.  Her last DEXA scan showed persistent osteoporosis, for which she is currently taking calcium  and vitamin D  supplement.  She decided not to take Prolia  after osteoporosis clinic f/u due to its side effects.  She has chronic left upper extremity pain since left wrist internal fixation surgery in 2021, complicated by complex regional pain syndrome.  She sees [facility], and takes Percocet as needed for severe pain now.  She has had sympathetic block without much relief.  She has tried Lyrica and Cymbalta  without much relief.   She reports chronic low back pain, from old compression fracture of lumbar spine.  She has tried taking Tylenol , but has resistant pain at times.  Denies any numbness or tingling of the LE.  Denies saddle anesthesia, urinary or stool incontinence.    Past Medical History:  Diagnosis Date   Aortic atherosclerosis    Asthma    Back pain    Closed fracture of left distal radius and ulna 12/03/2019   Last Assessment & Plan:   Formatting of this note might be different from the  original.  Treatment:  1.  I agreed to let patient stay home for 2 weeks and do her home exercises only however I wish to see the patient in 2 weeks to verify she is continue to improve her finger range of motion and wrist range of motion.  If she is not showing significant improvement at that time we will send her back    Complex regional pain syndrome type 1 of left upper extremity 03/14/2020   COPD (chronic obstructive pulmonary disease) (HCC)    Former cigarette smoker 07/07/2017   GERD (gastroesophageal reflux disease)    Hyperlipidemia 02/15/2021   Osteoporosis    Shortness of breath    with exertion   Vitamin D  deficiency     Past Surgical History:  Procedure Laterality Date   BREAST BIOPSY Left    benign   BREAST ENHANCEMENT SURGERY  1996   bilateral-[place]   BREAST EXCISIONAL BIOPSY Left    benign   BREAST LUMPECTOMY  08/30/2011   Procedure: LUMPECTOMY;  Surgeon: Thresa JAYSON Pulling;  Location: [facility];  Service: General;  Laterality: Left;   KYPHOPLASTY  2018   TUBAL LIGATION  1985   [place]    Family History  Problem Relation Age of Onset   Alcohol abuse Mother    Arthritis Mother    COPD Mother    Hyperlipidemia Mother    Pancreatitis Mother    Heart failure Father    Alcohol abuse Father  Arthritis Father    Diabetes Father    Heart disease Father        several heart attacks   Hyperlipidemia Father    Hypertension Father    Kidney disease Father    Stroke Father        several strokes   COPD Sister    Hypertension Sister    Anesthesia problems Neg Hx    Hypotension Neg Hx    Malignant hyperthermia Neg Hx    Pseudochol deficiency Neg Hx     Social History   Socioeconomic History   Marital status: Married    Spouse name: [name]   Number of children: 3   Years of education: 11   Highest education level: Not on file  Occupational History   Occupation: disabled    Comment: COPD  Tobacco Use   Smoking status: Former    Current  packs/day: 0.00    Average packs/day: 1 pack/day for 32.6 years (32.6 ttl pk-yrs)    Types: Cigarettes    Start date: 09/16/1976    Quit date: 04/18/2009    Years since quitting: 15.2    Passive exposure: Never   Smokeless tobacco: Never  Vaping Use   Vaping status: Never Used  Substance and Sexual Activity   Alcohol use: No    Alcohol/week: 0.0 standard drinks of alcohol   Drug use: No   Sexual activity: Yes  Other Topics Concern   Not on file  Social History Narrative   Disabled due to COPD   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   1 chihuahua.   Social Drivers of Corporate Investment Banker Strain: Low Risk  (07/14/2024)   Overall Financial Resource Strain (CARDIA)    Difficulty of Paying Living Expenses: Not hard at all  Food Insecurity: No Food Insecurity (07/14/2024)   Hunger Vital Sign    Worried About Running Out of Food in the Last Year: Never true    Ran Out of Food in the Last Year: Never true  Transportation Needs: No Transportation Needs (07/14/2024)   PRAPARE - Administrator, Civil Service (Medical): No    Lack of Transportation (Non-Medical): No  Physical Activity: Inactive (07/14/2024)   Exercise Vital Sign    Days of Exercise per Week: 0 days    Minutes of Exercise per Session: 0 min  Stress: No Stress Concern Present (07/14/2024)   [employer] of Occupational Health - Occupational Stress Questionnaire    Feeling of Stress: Not at all  Social Connections: Moderately Isolated (07/14/2024)   Social Connection and Isolation Panel    Frequency of Communication with Friends and Family: More than three times a week    Frequency of Social Gatherings with Friends and Family: Once a week    Attends Religious Services: More than 4 times per year    Active Member of [employer] or Organizations: No    Attends Banker Meetings: Never    Marital Status: Divorced  Catering Manager Violence: Not At  Risk (07/14/2024)   Humiliation, Afraid, Rape, and Kick questionnaire    Fear of Current or Ex-Partner: No    Emotionally Abused: No    Physically Abused: No    Sexually Abused: No    Outpatient Medications Prior to Visit  Medication Sig Dispense Refill   aspirin  81 MG chewable tablet Chew 1 tablet by mouth daily.     atorvastatin  (LIPITOR) 10 MG  tablet TAKE 1 TABLET BY MOUTH AT BEDTIME 90 tablet 3   cetirizine (ZYRTEC) 10 MG tablet Take 10 mg by mouth as needed for allergies or rhinitis.     cholecalciferol (VITAMIN D3) 25 MCG (1000 UT) tablet Take 5,000 Units by mouth daily.     naloxone (NARCAN) nasal spray 4 mg/0.1 mL Place 1 spray into the nose once.     oxyCODONE -acetaminophen  (PERCOCET/ROXICET) 5-325 MG tablet Take 1 tablet by mouth every 6 (six) hours as needed for moderate pain (pain score 4-6).     phenol (CHLORASEPTIC) 1.4 % LIQD Use as directed 1 spray in the mouth or throat as needed for throat irritation / pain. 118 mL 0   VENTOLIN  HFA 108 (90 Base) MCG/ACT inhaler INHALE 2 PUFFS BY MOUTH EVERY 4 HOURS AS NEEDED FOR SHORTNESS OF BREATH 18 g 2   alendronate  (FOSAMAX ) 70 MG tablet Take 1 tablet (70 mg total) by mouth every 7 (seven) days. Take with a full glass of water on an empty stomach. 4 tablet 11   BREZTRI  AEROSPHERE 160-9-4.8 MCG/ACT AERO inhaler INHALE 2 PUFFS TWICE DAILY 11 g 0   doxycycline (VIBRAMYCIN) 100 MG capsule Take 100 mg by mouth 2 (two) times daily.     No facility-administered medications prior to visit.    Allergies  Allergen Reactions   Other Nausea And Vomiting    Morphine  analogues    Pregabalin Other (See Comments)    Headaches Lyrica     ROS Review of Systems  Constitutional:  Negative for chills and fever.  HENT:  Negative for congestion and sinus pressure.   Eyes:  Negative for pain and discharge.  Respiratory:  Positive for shortness of breath (Chronic, intermittent). Negative for cough.   Cardiovascular:  Negative for chest pain and  palpitations.  Gastrointestinal:  Negative for abdominal pain, diarrhea, nausea and vomiting.  Endocrine: Negative for polydipsia and polyuria.  Genitourinary:  Negative for dysuria and hematuria.  Musculoskeletal:  Positive for arthralgias, back pain and gait problem. Negative for neck pain and neck stiffness.  Skin:  Negative for rash.  Neurological:  Negative for dizziness and weakness.  Psychiatric/Behavioral:  Negative for agitation and behavioral problems.       Objective:    Physical Exam Vitals reviewed.  Constitutional:      General: She is not in acute distress.    Appearance: She is not diaphoretic.  HENT:     Head: Normocephalic and atraumatic.     Nose: No congestion.     Mouth/Throat:     Mouth: Mucous membranes are moist.     Pharynx: No posterior oropharyngeal erythema.  Eyes:     General: No scleral icterus.    Extraocular Movements: Extraocular movements intact.  Cardiovascular:     Rate and Rhythm: Normal rate and regular rhythm.     Heart sounds: Normal heart sounds. No murmur heard. Pulmonary:     Breath sounds: Normal breath sounds. No wheezing or rales.  Abdominal:     Palpations: Abdomen is soft.     Tenderness: There is no abdominal tenderness.  Musculoskeletal:     Cervical back: Neck supple. No tenderness.     Lumbar back: Tenderness present. Decreased range of motion.     Right lower leg: No edema.     Left lower leg: No edema.  Skin:    General: Skin is warm.     Findings: No rash.  Neurological:     General: No focal deficit present.  Mental Status: She is alert and oriented to person, place, and time.     Cranial Nerves: No cranial nerve deficit.     Sensory: No sensory deficit.     Motor: No weakness.  Psychiatric:        Mood and Affect: Mood normal.        Behavior: Behavior normal.     BP 123/75   Pulse 77   Ht 5' 2 (1.575 m)   Wt 159 lb 6.4 oz (72.3 kg)   SpO2 96%   BMI 29.15 kg/m  Wt Readings from Last 3  Encounters:  07/21/24 159 lb 6.4 oz (72.3 kg)  07/14/24 160 lb (72.6 kg)  01/19/24 161 lb 12.8 oz (73.4 kg)    Lab Results  Component Value Date   TSH 1.640 07/18/2023   Lab Results  Component Value Date   WBC 10.0 01/13/2024   HGB 11.2 (L) 01/13/2024   HCT 35.3 (L) 01/13/2024   MCV 89.1 01/13/2024   PLT 149 (L) 01/13/2024   Lab Results  Component Value Date   NA 139 01/13/2024   K 4.7 01/13/2024   CO2 28 01/13/2024   GLUCOSE 118 (H) 01/13/2024   BUN 16 01/13/2024   CREATININE 0.62 01/13/2024   BILITOT 0.4 01/13/2024   ALKPHOS 57 01/13/2024   AST 15 01/13/2024   ALT 17 01/13/2024   PROT 5.9 (L) 01/13/2024   ALBUMIN 3.1 (L) 01/13/2024   CALCIUM  8.4 (L) 01/13/2024   ANIONGAP 7 01/13/2024   EGFR 97 07/18/2023   Lab Results  Component Value Date   CHOL 166 07/18/2023   Lab Results  Component Value Date   HDL 75 07/18/2023   Lab Results  Component Value Date   LDLCALC 77 07/18/2023   Lab Results  Component Value Date   TRIG 73 07/18/2023   Lab Results  Component Value Date   CHOLHDL 2.2 07/18/2023   Lab Results  Component Value Date   HGBA1C 5.9 (H) 07/18/2023      Assessment & Plan:   Problem List Items Addressed This Visit       Cardiovascular and Mediastinum   Aortic atherosclerosis   Noted on CT chest On aspirin  and statin Followed by cardiology        Respiratory   Panlobular emphysema (HCC)   Well-controlled with Breztri  Uses as needed albuterol  Has portable O2 as needed      Relevant Medications   budesonide -glycopyrrolate -formoterol  (BREZTRI  AEROSPHERE) 160-9-4.8 MCG/ACT AERO inhaler   Other Relevant Orders   CBC with Differential/Platelet   Chronic respiratory failure with hypoxia (HCC)   Has COPD, has quit smoking more than 10 years ago Has portable O2 as needed      Relevant Medications   budesonide -glycopyrrolate -formoterol  (BREZTRI  AEROSPHERE) 160-9-4.8 MCG/ACT AERO inhaler     Nervous and Auditory   Complex  regional pain syndrome type 1 of left upper extremity   Followed by [facility] -  taking Percocet PRN for severe pain Has tried stellate blocks without much relief Has tried Lyrica and Cymbalta  as well      Relevant Orders   TSH   CMP14+EGFR   CBC with Differential/Platelet     Musculoskeletal and Integument   Osteoporosis with pathological fracture with routine healing   History of compression fracture of thoracic and lumbar vertebra s/p kyphoplasty On alendronate  Last DEXA scan reviewed -would prefer to start Prolia  or Forteo - had referred to osteoporosis clinic, but she denies to start them  due to its side effects Continue calcium  and vitamin D  supplements      Relevant Medications   alendronate  (FOSAMAX ) 70 MG tablet     Other   Vitamin D  deficiency   Relevant Orders   VITAMIN D  25 Hydroxy (Vit-D Deficiency, Fractures)   Hyperlipidemia   On statin Advised to follow DASH diet      Relevant Orders   Lipid panel   Encounter for general adult medical examination with abnormal findings - Primary   Physical exam as documented. Fasting blood tests today. Flu vaccine today. Advised to get Shingrix vaccine at local pharmacy.      Prediabetes   Lab Results  Component Value Date   HGBA1C 5.9 (H) 07/18/2023   Advised to follow low-carb diet for now      Relevant Orders   Hemoglobin A1c   CMP14+EGFR   Screening for lung cancer   Has > 20-pack-year smoking history Referred to Pulmonology program for low-dose CT chest after discussing with the patient.      Relevant Orders   Ambulatory Referral Lung Cancer Screening [facility] Pulmonary   Other Visit Diagnoses       Encounter for immunization       Relevant Orders   Flu vaccine HIGH DOSE PF(Fluzone Trivalent) (Completed)       Meds ordered this encounter  Medications   budesonide -glycopyrrolate -formoterol  (BREZTRI  AEROSPHERE) 160-9-4.8 MCG/ACT AERO inhaler    Sig: Inhale 2 puffs into the lungs  2 (two) times daily.    Dispense:  11 g    Refill:  11   alendronate  (FOSAMAX ) 70 MG tablet    Sig: Take 1 tablet (70 mg total) by mouth every 7 (seven) days. Take with a full glass of water on an empty stomach.    Dispense:  4 tablet    Refill:  11    Follow-up: Return in about 6 months (around 01/18/2025) for COPD.    Suzzane MARLA Blanch, MD

## 2024-07-21 NOTE — Assessment & Plan Note (Signed)
 History of compression fracture of thoracic and lumbar vertebra s/p kyphoplasty On alendronate  Last DEXA scan reviewed -would prefer to start Prolia  or Forteo - had referred to osteoporosis clinic, but she denies to start them due to its side effects Continue calcium  and vitamin D  supplements

## 2024-07-22 ENCOUNTER — Ambulatory Visit: Payer: Self-pay | Admitting: Internal Medicine

## 2024-07-22 LAB — CBC WITH DIFFERENTIAL/PLATELET
Basophils Absolute: 0.1 x10E3/uL (ref 0.0–0.2)
Basos: 1 %
EOS (ABSOLUTE): 0.1 x10E3/uL (ref 0.0–0.4)
Eos: 2 %
Hematocrit: 41.1 % (ref 34.0–46.6)
Hemoglobin: 13.4 g/dL (ref 11.1–15.9)
Immature Grans (Abs): 0 x10E3/uL (ref 0.0–0.1)
Immature Granulocytes: 0 %
Lymphocytes Absolute: 1.4 x10E3/uL (ref 0.7–3.1)
Lymphs: 21 %
MCH: 29.8 pg (ref 26.6–33.0)
MCHC: 32.6 g/dL (ref 31.5–35.7)
MCV: 91 fL (ref 79–97)
Monocytes Absolute: 0.5 x10E3/uL (ref 0.1–0.9)
Monocytes: 7 %
Neutrophils Absolute: 4.6 x10E3/uL (ref 1.4–7.0)
Neutrophils: 69 %
Platelets: 259 x10E3/uL (ref 150–450)
RBC: 4.5 x10E6/uL (ref 3.77–5.28)
RDW: 12.7 % (ref 11.7–15.4)
WBC: 6.7 x10E3/uL (ref 3.4–10.8)

## 2024-07-22 LAB — CMP14+EGFR
ALT: 16 IU/L (ref 0–32)
AST: 18 IU/L (ref 0–40)
Albumin: 4.5 g/dL (ref 3.9–4.9)
Alkaline Phosphatase: 113 IU/L (ref 49–135)
BUN/Creatinine Ratio: 14 (ref 12–28)
BUN: 11 mg/dL (ref 8–27)
Bilirubin Total: 0.6 mg/dL (ref 0.0–1.2)
CO2: 26 mmol/L (ref 20–29)
Calcium: 9.7 mg/dL (ref 8.7–10.3)
Chloride: 101 mmol/L (ref 96–106)
Creatinine, Ser: 0.78 mg/dL (ref 0.57–1.00)
Globulin, Total: 2.4 g/dL (ref 1.5–4.5)
Glucose: 87 mg/dL (ref 70–99)
Potassium: 4.8 mmol/L (ref 3.5–5.2)
Sodium: 140 mmol/L (ref 134–144)
Total Protein: 6.9 g/dL (ref 6.0–8.5)
eGFR: 84 mL/min/1.73 (ref >59)

## 2024-07-22 LAB — LIPID PANEL
Chol/HDL Ratio: 2.3 ratio (ref 0.0–4.4)
Cholesterol, Total: 180 mg/dL (ref 100–199)
HDL: 78 mg/dL (ref >39)
LDL Chol Calc (NIH): 87 mg/dL (ref 0–99)
Triglycerides: 79 mg/dL (ref 0–149)
VLDL Cholesterol Cal: 15 mg/dL (ref 5–40)

## 2024-07-22 LAB — VITAMIN D 25 HYDROXY (VIT D DEFICIENCY, FRACTURES): Vit D, 25-Hydroxy: 39.8 ng/mL (ref 30.0–100.0)

## 2024-07-22 LAB — HEMOGLOBIN A1C
Est. average glucose Bld gHb Est-mCnc: 114 mg/dL
Hgb A1c MFr Bld: 5.6 % (ref 4.8–5.6)

## 2024-07-22 LAB — TSH: TSH: 1.88 u[IU]/mL (ref 0.450–4.500)

## 2024-07-27 ENCOUNTER — Telehealth: Payer: Self-pay

## 2024-07-27 ENCOUNTER — Other Ambulatory Visit: Payer: Self-pay

## 2024-07-27 NOTE — Telephone Encounter (Signed)
 Copied from CRM [id number]. Topic: General - Other >> Jul 26, 2024  3:50 PM Zebedee SAUNDERS wrote: Reason for CRM: Pt needs exemption from jury duty. Please call pt at [phone] when she can pick up letter for exemption.

## 2024-08-02 ENCOUNTER — Telehealth: Payer: Self-pay | Admitting: Acute Care

## 2024-08-02 ENCOUNTER — Other Ambulatory Visit: Payer: Self-pay

## 2024-08-02 DIAGNOSIS — Z87891 Personal history of nicotine dependence: Secondary | ICD-10-CM

## 2024-08-02 DIAGNOSIS — Z122 Encounter for screening for malignant neoplasm of respiratory organs: Secondary | ICD-10-CM

## 2024-08-02 NOTE — Telephone Encounter (Signed)
 Lung Cancer Screening Narrative/Criteria Questionnaire (Cigarette Smokers Only- No Cigars/Pipes/vapes)   Abigail Gill   SDMV:09/01/24 at 10a/[name]                                           12/10/1960              LDCT: 09/06/24 at 430p/[name]      63 y.o.   Phone: [phone]  Lung Screening Narrative (confirm age 9-77 yrs Medicare / 50-80 yrs Private pay insurance)   Insurance information:UHC / medicaid   Referring Provider:Patel   This screening involves an initial phone call with a team member from our program. It is called a shared decision making visit. The initial meeting is required by insurance and Medicare to make sure you understand the program. This appointment takes about 15-20 minutes to complete. The CT scan will completed at a separate date/time. This scan takes about 5-10 minutes to complete and you may eat and drink before and after the scan.  Criteria questions for Lung Cancer Screening:   Are you a current or former smoker? Former Age began smoking: 15y   If you are a former smoker, what year did you quit smoking? 2010   To calculate your smoking history, I need an accurate estimate of how many packs of cigarettes you smoked per day and for how many years. (Not just the number of PPD you are now smoking)   Years smoking 33 x Packs per day 1 = Pack years 33   (at least 20 pack yrs)   (Make sure they understand that we need to know how much they have smoked in the past, not just the number of PPD they are smoking now)  Do you have a personal history of cancer?  No    Do you have a family history of cancer? No  Are you coughing up blood?  No  Have you had unexplained weight loss of 15 lbs or more in the last 6 months? No  It looks like you meet all criteria.     Additional information: N/A

## 2024-09-01 ENCOUNTER — Ambulatory Visit: Admitting: Adult Health

## 2024-09-01 ENCOUNTER — Encounter: Payer: Self-pay | Admitting: Adult Health

## 2024-09-01 DIAGNOSIS — Z87891 Personal history of nicotine dependence: Secondary | ICD-10-CM | POA: Diagnosis not present

## 2024-09-01 NOTE — Patient Instructions (Signed)

## 2024-09-01 NOTE — Progress Notes (Signed)
°  Virtual Visit via Telephone Note  I connected with Abigail Gill , 09/01/2024 9:59 AM by a telemedicine application and verified that I am speaking with the correct person using two identifiers.  Location: Patient: home Provider: home   I discussed the limitations of evaluation and management by telemedicine and the availability of in person appointments. The patient expressed understanding and agreed to proceed.   Shared Decision Making Visit Lung Cancer Screening Program [phone])   Eligibility: 63 y.o. Pack Years Smoking History Calculation = 33 pack years  (# packs/per year x # years smoked) Recent History of coughing up blood  no Unexplained weight loss? no ( >Than 15 pounds within the last 6 months ) Prior History Lung / other cancer no (Diagnosis within the last 5 years already requiring surveillance chest CT Scans). Smoking Status Former Smoker Former Smokers: Years since quit: 1414 years  Quit Date: 2010  Visit Components: Discussion included one or more decision making aids. YES Discussion included risk/benefits of screening. YES Discussion included potential follow up diagnostic testing for abnormal scans. YES Discussion included meaning and risk of over diagnosis. YES Discussion included meaning and risk of False Positives. YES Discussion included meaning of total radiation exposure. YES  Counseling Included: Importance of adherence to annual lung cancer LDCT screening. YES Impact of comorbidities on ability to participate in the program. YES Ability and willingness to under diagnostic treatment. YES  Smoking Cessation Counseling: Former Smokers:  Discussed the importance of maintaining cigarette abstinence. yes Diagnosis Code: Personal History of Nicotine Dependence. S12.108 Information about tobacco cessation classes and interventions provided to patient. Yes Patient provided with ticket for LDCT Scan. yes Written Order for Lung Cancer Screening with  LDCT placed in Epic. Yes (CT Chest Lung Cancer Screening Low Dose W/O CM) [id number]   Z12.2-Screening of respiratory organs Z87.891-Personal history of nicotine dependence   [name] 09/01/2024

## 2024-09-06 ENCOUNTER — Encounter (HOSPITAL_COMMUNITY): Payer: Self-pay

## 2024-09-06 ENCOUNTER — Other Ambulatory Visit: Payer: Self-pay | Admitting: Internal Medicine

## 2024-09-06 ENCOUNTER — Ambulatory Visit (HOSPITAL_COMMUNITY): Admission: RE | Admit: 2024-09-06 | Discharge: 2024-09-06 | Attending: Acute Care | Admitting: Acute Care

## 2024-09-06 ENCOUNTER — Ambulatory Visit (HOSPITAL_COMMUNITY)
Admission: RE | Admit: 2024-09-06 | Discharge: 2024-09-06 | Disposition: A | Discharge status: Home / Self Care | Source: Ambulatory Visit | Attending: Internal Medicine | Admitting: Internal Medicine

## 2024-09-06 DIAGNOSIS — Z1231 Encounter for screening mammogram for malignant neoplasm of breast: Secondary | ICD-10-CM | POA: Diagnosis present

## 2024-09-06 DIAGNOSIS — Z122 Encounter for screening for malignant neoplasm of respiratory organs: Secondary | ICD-10-CM | POA: Insufficient documentation

## 2024-09-06 DIAGNOSIS — Z87891 Personal history of nicotine dependence: Secondary | ICD-10-CM | POA: Diagnosis present

## 2024-09-06 DIAGNOSIS — Z0000 Encounter for general adult medical examination without abnormal findings: Secondary | ICD-10-CM

## 2024-09-23 ENCOUNTER — Other Ambulatory Visit (HOSPITAL_COMMUNITY): Payer: Self-pay | Admitting: Physician Assistant

## 2024-09-23 DIAGNOSIS — R1310 Dysphagia, unspecified: Secondary | ICD-10-CM

## 2024-09-23 NOTE — Progress Notes (Addendum)
 Otolaryngology Clinic Note  HPI:    Chief Complaint  Patient presents with   Difficulty Swallowing    Patient presents into the clinic this afternoon with Dysphagia.    Abigail Gill is a pleasant 64 y.o. female who presents as an established patient for dysphagia. Duration of six months. Course is slightly worsening. She reports dysphagia associated with all textures of food, even her own saliva. Occasionally she regurgitates undigested food after about 30 minutes of swallowing. She takes TUMS as needed; generally does not have significant problems with reflux. She does report mucous in her throat and throat clearing along with occasional globus sensation. She has seen a GI provider in [place] in the past (about 6 years ago). No imaging studies.   Denies throat pain, odynophagia, hoarseness or otalgia.   No PMH of autoimmune disorders.  No history of sinonasal surgery.   Former smoker. Drinks one cup of coffee every morning and one 12 oz soda in the afternoon.  PMH/Meds/All/SocHx/FamHx/ROS:   Medical History[1]  Surgical History[2]  No family history of bleeding disorders, wound healing problems or difficulty with anesthesia.   Social Connections: Moderately Isolated (07/14/2024)   Received from [facility]   Social Connection and Isolation Panel    In a typical week, how many times do you talk on the phone with family, friends, or neighbors?: More than three times a week    How often do you get together with friends or relatives?: Once a week    How often do you attend church or religious services?: More than 4 times per year    Do you belong to any clubs or organizations such as church groups, unions, fraternal or athletic groups, or school groups?: No    How often do you attend meetings of the clubs or organizations you belong to?: Never    Are you married, widowed, divorced, separated, never married, or living with a partner?: Divorced    Current Medications[3]  A  complete ROS was performed with pertinent positives/negatives noted in the HPI. The remainder of the ROS are negative.    Physical Exam:    There were no vitals taken for this visit.   General Awake, at baseline alertness during examination.  Eyes No scleral icterus or conjunctival hemorrhage. Globe position appears normal. EOMI.   Right Ear EAC patent, TM intact w/o inflammation. Middle ear well aerated.   Left Ear EAC patent, TM intact w/o inflammation. Middle ear well aerated.   Nose Stable left-sided anterior shallow ulceration that is dry. Patent, no polyps or masses seen on anterior rhinoscopy.  Oral cavity No mucosal lesions or tumors seen. Tongue midline.   Oropharynx Symmetric tonsils.   Neck No abnormal cervical lymphadenopathy. No thyromegaly. No thyroid  masses palpated.  Cardio-vascular No cyanosis.  Pulmonary No audible stridor. Breathing easily with no labor.  Neuro Symmetric facial movement.   Psychiatry Appropriate affect and mood for clinic visit.   Independent Review of Additional Tests or Records:  Medical records.   Procedures:   Flexible Laryngoscopy Procedure Note:  Date: 09/23/24  Indications: Dysphagia.   Risks, benefits and clinical relevance of the study were discussed. The patient understands and agrees to proceed.   Procedure: After adequate topical anesthetic was applied, 4 mm flexible laryngoscope was passed through the nasal cavity without difficulty. Flexible laryngoscopy shows patent anterior nasal cavity with minimal crusting, no discharge or infection.  Normal base of tongue and supraglottis. Normal vocal cord mobility without vocal cord nodule, mass, polyp or  tumor. Hypopharynx normal without mass, pooling of secretions or aspiration. Moderate post-glottic edema, otherwise unremarkable exam.   Patient tolerated procedure without complication or difficulty.   Velia Pry, PA-C     Impression & Plans:  [name] is a 64 y.o.  female here for dysphagia and oral regurgitation. Based on fiberoptic exam of the larynx, I suspect she has underlying reflux.  We discussed dietary and lifestyle modifications for GERD. I recommend further evaluation with a barium swallow study. I will contact her with those results. Follow up accordingly with ENT.   Patient agrees with the plan.  Velia Pry, PA-C [facility]       [1] Past Medical History: Diagnosis Date   Lung disease   [2] No past surgical history on file. [3]  Current Outpatient Medications:    albuterol  HFA (PROVENTIL  HFA;VENTOLIN  HFA;PROAIR  HFA) 90 mcg/actuation inhaler, Inhale., Disp: , Rfl:    alendronate  (FOSAMAX ) 70 mg tablet, Take 70 mg by mouth., Disp: , Rfl:    aspirin  81 mg chewable tablet, Take  by mouth., Disp: , Rfl:    atorvastatin  (LIPITOR) 10 mg tablet, Take 10 mg by mouth., Disp: , Rfl:    Breztri  Aerosphere 160-9-4.8 mcg/actuation HFAA, Inhale., Disp: , Rfl:    cetirizine (ZyrTEC) 10 mg tablet, Take 10 mg by mouth., Disp: , Rfl:    cholecalciferol (VITAMIN D3) 1,000 unit (25 mcg) tablet, Take 10,000 Units by mouth Once Daily., Disp: , Rfl:    mupirocin (BACTROBAN) 2 % ointment, Apply a small amount to affected area inside the left nasal passage twice daily for 3 weeks., Disp: 22 g, Rfl: 1

## 2024-09-27 ENCOUNTER — Ambulatory Visit (HOSPITAL_COMMUNITY)
Admission: RE | Admit: 2024-09-27 | Discharge: 2024-09-27 | Disposition: A | Discharge status: Home / Self Care | Source: Ambulatory Visit | Attending: Physician Assistant | Admitting: Physician Assistant

## 2024-09-27 DIAGNOSIS — R1310 Dysphagia, unspecified: Secondary | ICD-10-CM | POA: Diagnosis present

## 2024-10-14 ENCOUNTER — Encounter: Payer: Self-pay | Admitting: Gastroenterology

## 2024-11-22 ENCOUNTER — Ambulatory Visit: Admitting: Gastroenterology

## 2025-01-18 ENCOUNTER — Ambulatory Visit: Admitting: Internal Medicine

## 2025-07-18 ENCOUNTER — Ambulatory Visit
